# Patient Record
Sex: Female | Born: 1950 | Race: White | Hispanic: No | Marital: Single | Smoking: Former smoker
Health system: Southern US, Community
[De-identification: ages and names within clinical notes are randomized; demographics above are authoritative.]

## PROBLEM LIST (undated history)

## (undated) DIAGNOSIS — Z972 Presence of dental prosthetic device (complete) (partial): Secondary | ICD-10-CM

## (undated) DIAGNOSIS — M8580 Other specified disorders of bone density and structure, unspecified site: Secondary | ICD-10-CM

## (undated) DIAGNOSIS — R519 Headache, unspecified: Secondary | ICD-10-CM

## (undated) DIAGNOSIS — E0590 Thyrotoxicosis, unspecified without thyrotoxic crisis or storm: Secondary | ICD-10-CM

## (undated) DIAGNOSIS — Z8614 Personal history of Methicillin resistant Staphylococcus aureus infection: Secondary | ICD-10-CM

## (undated) DIAGNOSIS — M1990 Unspecified osteoarthritis, unspecified site: Secondary | ICD-10-CM

## (undated) DIAGNOSIS — Q228 Other congenital malformations of tricuspid valve: Secondary | ICD-10-CM

## (undated) DIAGNOSIS — E039 Hypothyroidism, unspecified: Secondary | ICD-10-CM

## (undated) DIAGNOSIS — T7840XA Allergy, unspecified, initial encounter: Secondary | ICD-10-CM

## (undated) DIAGNOSIS — I369 Nonrheumatic tricuspid valve disorder, unspecified: Secondary | ICD-10-CM

## (undated) DIAGNOSIS — C4490 Unspecified malignant neoplasm of skin, unspecified: Secondary | ICD-10-CM

## (undated) DIAGNOSIS — I341 Nonrheumatic mitral (valve) prolapse: Secondary | ICD-10-CM

## (undated) DIAGNOSIS — G912 (Idiopathic) normal pressure hydrocephalus: Secondary | ICD-10-CM

## (undated) DIAGNOSIS — R002 Palpitations: Secondary | ICD-10-CM

## (undated) HISTORY — PX: OTHER SURGICAL HISTORY: SHX169

## (undated) HISTORY — PX: COLONOSCOPY: SHX174

## (undated) HISTORY — PX: ORIF FOOT FRACTURE: SHX2123

## (undated) HISTORY — PX: BREAST BIOPSY: SHX20

---
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
## 2012-11-11 ENCOUNTER — Encounter: Payer: Self-pay | Admitting: Cardiology

## 2014-05-04 DIAGNOSIS — N3946 Mixed incontinence: Secondary | ICD-10-CM | POA: Insufficient documentation

## 2016-04-10 LAB — HM MAMMOGRAPHY

## 2016-12-09 LAB — HM COLONOSCOPY

## 2018-04-20 ENCOUNTER — Encounter: Payer: Self-pay | Admitting: Podiatry

## 2018-04-20 ENCOUNTER — Ambulatory Visit: Payer: Medicare HMO | Admitting: Podiatry

## 2018-04-20 VITALS — BP 110/74 | HR 78

## 2018-04-20 DIAGNOSIS — M205X9 Other deformities of toe(s) (acquired), unspecified foot: Secondary | ICD-10-CM

## 2018-04-20 DIAGNOSIS — M2020 Hallux rigidus, unspecified foot: Secondary | ICD-10-CM

## 2018-04-20 DIAGNOSIS — L600 Ingrowing nail: Secondary | ICD-10-CM

## 2018-04-20 DIAGNOSIS — L608 Other nail disorders: Secondary | ICD-10-CM

## 2018-04-20 NOTE — Progress Notes (Signed)
This patient presents the office with chief complaint of a painful inside border big toe left foot.  She says she has been experiencing pain and redness for approximately 2 months.  She states that she is experiencing throbbing pain when walking.  She has self treated with Epson salts and worked on the nails herself but the problem persists.  She denies any drainage from the inside border left big toe.  She states she also has a similar problem on her right great toe which is not painful today.  She presents the office today for an evaluation and treatment of her left big toe.  Vascular  Dorsalis pedis and posterior tibial pulses are palpable  B/L.  Capillary return  WNL.  Temperature gradient is  WNL.  Skin turgor  WNL  Sensorium  Senn Weinstein monofilament wire  WNL. Normal tactile sensation.  Nail Exam  Patient has normal nails with no evidence of bacterial or fungal infection.  Marked incurvation noted along the medial border of the left great toenail in the absence of any drainage pus or granulation tissue.  Orthopedic  Exam  Muscle tone and muscle strength  WNL.  No limitations of motion feet  B/L.  No crepitus or joint effusion noted.  Functional hallux limitus 1st MPJ  B/L.  Skin  No open lesions.  Normal skin texture and turgor.  Incurvated nail left hallux.  FHL  B/L.  IE.  Debridement of the medial border left great toenail.  Discussed this condition with this patient.  Told her that due to the structure of her nail she is having an incurvation noted at the medial border both great toes.  The redness and pain that she experiences is due to this incurvation and is due to pressure.  Debrided the medial border left great toenail.  Discussed future plans for permanent correction of the medial border left great toenail.  During my examination I noted she had a functional hallux limitus bilaterally.  Patient states that she is very active and that her feet do hurt and she is interested in kinetic  wedge orthoses.  She was told to make an appointment with [facility] for future orthotic casting.   Gardiner Barefoot DPM

## 2018-05-13 ENCOUNTER — Ambulatory Visit (INDEPENDENT_AMBULATORY_CARE_PROVIDER_SITE_OTHER): Payer: Medicare HMO | Admitting: Orthotics

## 2018-05-13 DIAGNOSIS — M205X9 Other deformities of toe(s) (acquired), unspecified foot: Secondary | ICD-10-CM

## 2018-05-13 DIAGNOSIS — M2020 Hallux rigidus, unspecified foot: Secondary | ICD-10-CM | POA: Diagnosis not present

## 2018-05-13 NOTE — Progress Notes (Signed)
Patient seen today per dr. Prudence Davidson.  She presents with hx of pain associated with FHL b/L.   She reports pain in the 1st MPJ. Plan on Richy to fan CMFO w/ b/l k-wedge.  Liliane Channel

## 2018-05-14 ENCOUNTER — Encounter: Payer: Self-pay | Admitting: Podiatry

## 2018-05-14 ENCOUNTER — Ambulatory Visit: Payer: Medicare HMO | Admitting: Podiatry

## 2018-05-14 DIAGNOSIS — L600 Ingrowing nail: Secondary | ICD-10-CM | POA: Diagnosis not present

## 2018-05-14 DIAGNOSIS — L608 Other nail disorders: Secondary | ICD-10-CM

## 2018-05-14 NOTE — Progress Notes (Signed)
This patient presents to the office requesting nail surgery that was discussed last month.  We held off on her surgery since she was on vacation.  She says she has pain and discomfort on the inside border left great toenail. She presents for definitive nail treatment.  Vascular  Dorsalis pedis and posterior tibial pulses are palpable  B/L.  Capillary return  WNL.  Temperature gradient is  WNL.  Skin turgor  WNL  Sensorium  Senn Weinstein monofilament wire  WNL. Normal tactile sensation.  Nail Exam  Patient has normal nails with no evidence of bacterial or fungal infection.  Marked incurvation medial border left hallux in the absence of granulation tissue.  Orthopedic  Exam  Muscle tone and muscle strength  WNL.  No limitations of motion feet  B/L.  No crepitus or joint effusion noted.  Foot type is unremarkable and digits show no abnormalities.  Functional hallux limitus 1st MPJ  B/L.  Skin  No open lesions.  Normal skin texture and turgor.  Incurvated ingrown toenail medial border left hallux.  ROV.  Nail surgery.  Treatment options and alternatives discussed.  Recommended permanent phenol matrixectomy and patient agreed.  Left hallux  was prepped with alcohol and a toe block of 3cc of 2% lidocaine plain was administered in a digital toe block. .  The toe was then prepped with betadine solution . A tourniquet was applied to toe. The offending nail border was then excised and matrix tissue exposed.  Phenol was then applied to the matrix tissue followed by an alcohol wash.  Antibiotic ointment and a dry sterile dressing was applied.  The patient was dispensed instructions for aftercare. RTC 10 days.     Gardiner Barefoot DPM

## 2018-05-25 ENCOUNTER — Ambulatory Visit: Payer: Medicare HMO | Admitting: Podiatry

## 2018-05-25 ENCOUNTER — Encounter: Payer: Self-pay | Admitting: Podiatry

## 2018-05-25 DIAGNOSIS — M205X9 Other deformities of toe(s) (acquired), unspecified foot: Secondary | ICD-10-CM

## 2018-05-25 DIAGNOSIS — Z09 Encounter for follow-up examination after completed treatment for conditions other than malignant neoplasm: Secondary | ICD-10-CM

## 2018-05-25 DIAGNOSIS — M2020 Hallux rigidus, unspecified foot: Secondary | ICD-10-CM

## 2018-05-25 DIAGNOSIS — L608 Other nail disorders: Secondary | ICD-10-CM

## 2018-05-25 MED ORDER — DOXYCYCLINE HYCLATE 100 MG PO TABS: 100.0000 mg | ORAL_TABLET | Freq: Two times a day (BID) | ORAL | 0 refills | Status: DC

## 2018-05-25 NOTE — Progress Notes (Signed)
.    This patient returns to the office follow-up for nail surgery on the inside border of the left big toe.  She states she was doing very well until last Friday when she started 2 weeks experience pain and discomfort at the site of the nail surgery.  She denies any drainage from the site of the surgery.  She states that the surgical site is still very tender and she presents the office under the assumption she has an infection to her left big toe.  Vascular  Dorsalis pedis and posterior tibial pulses are palpable  B/L.  Capillary return  WNL.  Temperature gradient is  WNL.  Skin turgor  WNL  Sensorium  Senn Weinstein monofilament wire  WNL. Normal tactile sensation.  Nail Exam  Patient has normal nails with no evidence of bacterial or fungal infection. No evidence of redness or swelling along the medial border left hallux.  Orthopedic  Exam  Muscle tone and muscle strength  WNL.  No limitations of motion feet  B/L.  No crepitus or joint effusion noted.  Foot type is unremarkable and digits show no abnormalities.  Bony prominences are unremarkable. Functional hallux limitus noted  B/L  Skin  No open lesions.  Normal skin texture and turgor.  S/P Nail surgery left hallux.  Functional hallux limitus 1st MPJ  B/L.  ROV.  Discussed the nail surgery with this patient.  Told her that the surgical site is healing well but she still appears to have pain out of proportion.  Therefore I prescribed her doxycycline No. 15 1 twice daily until completed patient also requests that she received a note for gait therapy.  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  Patient to return to the office as needed   Gardiner Barefoot DPM

## 2018-05-27 ENCOUNTER — Ambulatory Visit (INDEPENDENT_AMBULATORY_CARE_PROVIDER_SITE_OTHER): Payer: Medicare HMO | Admitting: Orthotics

## 2018-05-27 DIAGNOSIS — M2020 Hallux rigidus, unspecified foot: Principal | ICD-10-CM

## 2018-05-27 DIAGNOSIS — M205X9 Other deformities of toe(s) (acquired), unspecified foot: Secondary | ICD-10-CM

## 2018-05-29 NOTE — Progress Notes (Signed)
Patient came in today to p/up functional foot orthotics.   The orthotics were assessed to both fit and function.  The F/O addressed the biomechanical issues/pathologies as intended, offering good longitudinal arch support, proper offloading, and foot support. There weren't any signs of discomfort or irritation.  The F/O fit properly in footwear with minimal trimming/adjustments. 

## 2018-07-21 ENCOUNTER — Encounter: Admission: RE | Payer: Self-pay | Source: Home / Self Care

## 2018-07-21 ENCOUNTER — Ambulatory Visit: Admission: RE | Admit: 2018-07-21 | Payer: Medicare HMO | Source: Home / Self Care | Admitting: Ophthalmology

## 2018-07-21 SURGERY — PHACOEMULSIFICATION, CATARACT, WITH IOL INSERTION
Anesthesia: Choice | Laterality: Right

## 2018-09-15 ENCOUNTER — Other Ambulatory Visit: Payer: Self-pay

## 2018-09-17 NOTE — Discharge Instructions (Signed)

## 2018-09-18 ENCOUNTER — Other Ambulatory Visit
Admission: RE | Admit: 2018-09-18 | Discharge: 2018-09-18 | Disposition: A | Discharge status: Home / Self Care | Payer: Medicare HMO | Source: Ambulatory Visit | Attending: Ophthalmology | Admitting: Ophthalmology

## 2018-09-18 ENCOUNTER — Other Ambulatory Visit: Payer: Self-pay

## 2018-09-18 DIAGNOSIS — Z01812 Encounter for preprocedural laboratory examination: Secondary | ICD-10-CM | POA: Insufficient documentation

## 2018-09-18 DIAGNOSIS — Z1159 Encounter for screening for other viral diseases: Secondary | ICD-10-CM | POA: Diagnosis not present

## 2018-09-19 LAB — NOVEL CORONAVIRUS, NAA (HOSP ORDER, SEND-OUT TO REF LAB; TAT 18-24 HRS): SARS-CoV-2, NAA: NOT DETECTED
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
## 2018-09-22 ENCOUNTER — Ambulatory Visit: Payer: Medicare HMO | Admitting: Anesthesiology

## 2018-09-22 ENCOUNTER — Encounter
Admission: RE | Disposition: A | Discharge status: Home / Self Care | Payer: Self-pay | Source: Home / Self Care | Attending: Ophthalmology

## 2018-09-22 ENCOUNTER — Ambulatory Visit
Admission: RE | Admit: 2018-09-22 | Discharge: 2018-09-22 | Disposition: A | Discharge status: Home / Self Care | Payer: Medicare HMO | Attending: Ophthalmology | Admitting: Ophthalmology

## 2018-09-22 ENCOUNTER — Other Ambulatory Visit: Payer: Self-pay

## 2018-09-22 DIAGNOSIS — H2511 Age-related nuclear cataract, right eye: Secondary | ICD-10-CM | POA: Diagnosis present

## 2018-09-22 DIAGNOSIS — I2510 Atherosclerotic heart disease of native coronary artery without angina pectoris: Secondary | ICD-10-CM | POA: Diagnosis not present

## 2018-09-22 DIAGNOSIS — Z85828 Personal history of other malignant neoplasm of skin: Secondary | ICD-10-CM | POA: Diagnosis not present

## 2018-09-22 DIAGNOSIS — Z79890 Hormone replacement therapy (postmenopausal): Secondary | ICD-10-CM | POA: Insufficient documentation

## 2018-09-22 DIAGNOSIS — F17210 Nicotine dependence, cigarettes, uncomplicated: Secondary | ICD-10-CM | POA: Insufficient documentation

## 2018-09-22 DIAGNOSIS — E079 Disorder of thyroid, unspecified: Secondary | ICD-10-CM | POA: Insufficient documentation

## 2018-09-22 HISTORY — DX: Hypothyroidism, unspecified: E03.9

## 2018-09-22 HISTORY — DX: Other congenital malformations of tricuspid valve: Q22.8

## 2018-09-22 HISTORY — DX: Presence of dental prosthetic device (complete) (partial): Z97.2

## 2018-09-22 HISTORY — DX: Allergy, unspecified, initial encounter: T78.40XA

## 2018-09-22 HISTORY — DX: Other specified disorders of bone density and structure, unspecified site: M85.80

## 2018-09-22 HISTORY — DX: Nonrheumatic mitral (valve) prolapse: I34.1

## 2018-09-22 HISTORY — DX: Unspecified malignant neoplasm of skin, unspecified: C44.90

## 2018-09-22 HISTORY — PX: CATARACT EXTRACTION W/PHACO: SHX586

## 2018-09-22 HISTORY — DX: Palpitations: R00.2

## 2018-09-22 HISTORY — DX: Unspecified osteoarthritis, unspecified site: M19.90

## 2018-09-22 SURGERY — PHACOEMULSIFICATION, CATARACT, WITH IOL INSERTION
Anesthesia: Monitor Anesthesia Care | Site: Eye | Laterality: Right

## 2018-09-22 MED ORDER — EPINEPHRINE PF 1 MG/ML IJ SOLN
INTRAOCULAR | Status: DC | PRN
  Administered 2018-09-22: 11:00:00 78 mL via OPHTHALMIC

## 2018-09-22 MED ORDER — ARMC OPHTHALMIC DILATING DROPS
1.0000 "application " | OPHTHALMIC | Status: DC | PRN
  Administered 2018-09-22 (×3): 1 via OPHTHALMIC

## 2018-09-22 MED ORDER — NA CHONDROIT SULF-NA HYALURON 40-17 MG/ML IO SOLN
INTRAOCULAR | Status: DC | PRN
  Administered 2018-09-22: 1 mL via INTRAOCULAR

## 2018-09-22 MED ORDER — MOXIFLOXACIN HCL 0.5 % OP SOLN
OPHTHALMIC | Status: DC | PRN
  Administered 2018-09-22: 0.2 mL via OPHTHALMIC

## 2018-09-22 MED ORDER — LIDOCAINE HCL (PF) 2 % IJ SOLN
INTRAOCULAR | Status: DC | PRN
  Administered 2018-09-22: 11:00:00 1 mL

## 2018-09-22 MED ORDER — BRIMONIDINE TARTRATE-TIMOLOL 0.2-0.5 % OP SOLN
OPHTHALMIC | Status: DC | PRN
  Administered 2018-09-22: 1 [drp] via OPHTHALMIC

## 2018-09-22 MED ORDER — ONDANSETRON HCL 4 MG/2ML IJ SOLN: 4.0000 mg | Freq: Once | INTRAMUSCULAR | Status: DC | PRN

## 2018-09-22 MED ORDER — FENTANYL CITRATE (PF) 100 MCG/2ML IJ SOLN
INTRAMUSCULAR | Status: DC | PRN
  Administered 2018-09-22: 50 ug via INTRAVENOUS

## 2018-09-22 MED ORDER — MIDAZOLAM HCL 2 MG/2ML IJ SOLN
INTRAMUSCULAR | Status: DC | PRN
  Administered 2018-09-22: 1 mg via INTRAVENOUS

## 2018-09-22 MED ORDER — TETRACAINE HCL 0.5 % OP SOLN
1.0000 [drp] | OPHTHALMIC | Status: DC | PRN
  Administered 2018-09-22 (×3): 1 [drp] via OPHTHALMIC

## 2018-09-22 SURGICAL SUPPLY — 21 items
ACRYSOF IQ PANOPTIX UV IOL  SIZE D (Intraocular Lens) ×1 IMPLANT
CANNULA ANT/CHMB 27G (MISCELLANEOUS) ×1 IMPLANT
CANNULA ANT/CHMB 27GA (MISCELLANEOUS) ×2 IMPLANT
GLOVE SURG LX 8.0 MICRO (GLOVE) ×2
GLOVE SURG LX STRL 8.0 MICRO (GLOVE) ×1 IMPLANT
GLOVE SURG TRIUMPH 8.0 PF LTX (GLOVE) ×2 IMPLANT
GOWN STRL REUS W/ TWL LRG LVL3 (GOWN DISPOSABLE) ×2 IMPLANT
GOWN STRL REUS W/TWL LRG LVL3 (GOWN DISPOSABLE) ×2
MARKER SKIN DUAL TIP RULER LAB (MISCELLANEOUS) ×2 IMPLANT
NDL FILTER BLUNT 18X1 1/2 (NEEDLE) ×1 IMPLANT
NDL RETROBULBAR .5 NSTRL (NEEDLE) ×2 IMPLANT
NEEDLE FILTER BLUNT 18X 1/2SAF (NEEDLE) ×1
NEEDLE FILTER BLUNT 18X1 1/2 (NEEDLE) ×1 IMPLANT
PACK EYE AFTER SURG (MISCELLANEOUS) ×2 IMPLANT
PACK OPTHALMIC (MISCELLANEOUS) ×2 IMPLANT
PACK PORFILIO (MISCELLANEOUS) ×2 IMPLANT
SUT ETHILON 10-0 CS-B-6CS-B-6 (SUTURE)
SUTURE EHLN 10-0 CS-B-6CS-B-6 (SUTURE) IMPLANT
SYR 3ML LL SCALE MARK (SYRINGE) ×2 IMPLANT
SYR TB 1ML LUER SLIP (SYRINGE) ×2 IMPLANT
WIPE NON LINTING 3.25X3.25 (MISCELLANEOUS) ×2 IMPLANT

## 2018-09-22 NOTE — H&P (Signed)
All labs reviewed. Abnormal studies sent to patients PCP when indicated.  Previous H&P reviewed, patient examined, there are NO CHANGES.  Martha Hickox Porfilio6/16/202010:17 AM

## 2018-09-22 NOTE — Anesthesia Procedure Notes (Signed)
Procedure Name: MAC Date/Time: 09/22/2018 10:28 AM Performed by: Cameron Ali, CRNA Pre-anesthesia Checklist: Patient identified, Emergency Drugs available, Suction available, Timeout performed and Patient being monitored Patient Re-evaluated:Patient Re-evaluated prior to induction Oxygen Delivery Method: Nasal cannula Placement Confirmation: positive ETCO2

## 2018-09-22 NOTE — Op Note (Addendum)
PREOPERATIVE DIAGNOSIS:  Nuclear sclerotic cataract of the right eye.   POSTOPERATIVE DIAGNOSIS:  CATARACT   OPERATIVE PROCEDURE: Procedure(s): CATARACT EXTRACTION PHACO AND INTRAOCULAR LENS PLACEMENT ([place])  RIGHT panooptix   SURGEON:  Birder Robson, MD.   ANESTHESIA:  Anesthesiologist: Darrin Nipper, MD CRNA: Cameron Ali, CRNA  1.      Managed anesthesia care. 2.      0.56ml of Shugarcaine was instilled in the eye following the paracentesis.   COMPLICATIONS:  None.   TECHNIQUE:   Stop and chop   DESCRIPTION OF PROCEDURE:  The patient was examined and consented in the preoperative holding area where the aforementioned topical anesthesia was applied to the right eye and then brought back to the Operating Room where the right eye was prepped and draped in the usual sterile ophthalmic fashion and a lid speculum was placed. A paracentesis was created with the side port blade and the anterior chamber was filled with viscoelastic. A near clear corneal incision was performed with the steel keratome. A continuous curvilinear capsulorrhexis was performed with a cystotome followed by the capsulorrhexis forceps. Hydrodissection and hydrodelineation were carried out with BSS on a blunt cannula. The lens was removed in a stop and chop  technique and the remaining cortical material was removed with the irrigation-aspiration handpiece. The capsular bag was inflated with viscoelastic and the Alcon TFAT 22.5  lens was placed in the capsular bag without complication. The remaining viscoelastic was removed from the eye with the irrigation-aspiration handpiece. The wounds were hydrated. The anterior chamber was flushed with BSS and the eye was inflated to physiologic pressure. 0.75ml of Vigamox was placed in the anterior chamber. The wounds were found to be water tight. The eye was dressed with [place]. The patient was given protective glasses to wear throughout the day and a shield with which to sleep  tonight. The patient was also given drops with which to begin a drop regimen today and will follow-up with me in one day. Implant Name Type Inv. Item Serial No. Manufacturer Lot No. LRB No. Used Action  ACRYSOF IQ PANOPTIX UV IOL  SIZE D Intraocular Lens  [id number] ALCON  Right 1 Implanted   Procedure(s): CATARACT EXTRACTION PHACO AND INTRAOCULAR LENS PLACEMENT ([facility])  RIGHT panooptix (Right)  Electronically signed: Birder Robson 09/22/2018 10:52 AM

## 2018-09-22 NOTE — Anesthesia Postprocedure Evaluation (Signed)
Anesthesia Post Note  Patient: Martha Wilson  Procedure(s) Performed: CATARACT EXTRACTION PHACO AND INTRAOCULAR LENS PLACEMENT ([facility])  RIGHT panooptix (Right Eye)  Patient location during evaluation: PACU Anesthesia Type: MAC Level of consciousness: awake and alert, oriented and patient cooperative Pain management: pain level controlled Vital Signs Assessment: post-procedure vital signs reviewed and stable Respiratory status: spontaneous breathing, nonlabored ventilation and respiratory function stable Cardiovascular status: blood pressure returned to baseline and stable Postop Assessment: adequate PO intake Anesthetic complications: no    Darrin Nipper

## 2018-09-22 NOTE — Transfer of Care (Signed)
Immediate Anesthesia Transfer of Care Note  Patient: Martha Wilson  Procedure(s) Performed: CATARACT EXTRACTION PHACO AND INTRAOCULAR LENS PLACEMENT ([facility])  RIGHT panooptix (Right Eye)  Patient Location: PACU  Anesthesia Type: MAC  Level of Consciousness: awake, alert  and patient cooperative  Airway and Oxygen Therapy: Patient Spontanous Breathing and Patient connected to supplemental oxygen  Post-op Assessment: Post-op Vital signs reviewed, Patient's Cardiovascular Status Stable, Respiratory Function Stable, Patent Airway and No signs of Nausea or vomiting  Post-op Vital Signs: Reviewed and stable  Complications: No apparent anesthesia complications

## 2018-09-23 ENCOUNTER — Encounter: Payer: Self-pay | Admitting: Ophthalmology

## 2018-09-25 ENCOUNTER — Telehealth: Payer: Self-pay

## 2018-09-25 NOTE — Telephone Encounter (Signed)
Pt would like to schedule a new pt appt with Dr. Derrel Nip. She stated that Dr. Derrel Nip accepted her sometime back just hasn't scheduled the appt yet.

## 2018-09-28 NOTE — Telephone Encounter (Signed)
I do not see in Pt's chart or have a message from Dr. Derrel Nip stating she would except her.

## 2018-09-29 NOTE — Telephone Encounter (Signed)
Dr. Derrel Nip did accept as a new pt.

## 2018-09-29 NOTE — Telephone Encounter (Signed)
Do you recall accepting as a new pt?

## 2018-09-29 NOTE — Telephone Encounter (Signed)
Yes, she is [employer]

## 2018-09-30 ENCOUNTER — Other Ambulatory Visit: Payer: Self-pay

## 2018-09-30 ENCOUNTER — Encounter: Payer: Self-pay | Admitting: *Deleted

## 2018-09-30 NOTE — Telephone Encounter (Signed)
Lm on vm to call office and set up new patient appt with Dr. Derrel Nip.

## 2018-10-02 ENCOUNTER — Other Ambulatory Visit
Admission: RE | Admit: 2018-10-02 | Discharge: 2018-10-02 | Disposition: A | Discharge status: Home / Self Care | Payer: Medicare HMO | Source: Ambulatory Visit | Attending: Ophthalmology | Admitting: Ophthalmology

## 2018-10-02 ENCOUNTER — Other Ambulatory Visit: Payer: Self-pay

## 2018-10-02 DIAGNOSIS — Z1159 Encounter for screening for other viral diseases: Secondary | ICD-10-CM | POA: Insufficient documentation

## 2018-10-02 NOTE — Discharge Instructions (Signed)

## 2018-10-03 LAB — NOVEL CORONAVIRUS, NAA (HOSP ORDER, SEND-OUT TO REF LAB; TAT 18-24 HRS): SARS-CoV-2, NAA: NOT DETECTED

## 2018-10-06 ENCOUNTER — Ambulatory Visit
Admission: RE | Admit: 2018-10-06 | Discharge: 2018-10-06 | Disposition: A | Discharge status: Home / Self Care | Payer: Medicare HMO | Attending: Ophthalmology | Admitting: Ophthalmology

## 2018-10-06 ENCOUNTER — Ambulatory Visit: Payer: Medicare HMO | Admitting: Anesthesiology

## 2018-10-06 ENCOUNTER — Other Ambulatory Visit: Payer: Self-pay

## 2018-10-06 ENCOUNTER — Encounter
Admission: RE | Disposition: A | Discharge status: Home / Self Care | Payer: Self-pay | Source: Home / Self Care | Attending: Ophthalmology

## 2018-10-06 DIAGNOSIS — Z8614 Personal history of Methicillin resistant Staphylococcus aureus infection: Secondary | ICD-10-CM | POA: Diagnosis not present

## 2018-10-06 DIAGNOSIS — I2510 Atherosclerotic heart disease of native coronary artery without angina pectoris: Secondary | ICD-10-CM | POA: Insufficient documentation

## 2018-10-06 DIAGNOSIS — M1990 Unspecified osteoarthritis, unspecified site: Secondary | ICD-10-CM | POA: Insufficient documentation

## 2018-10-06 DIAGNOSIS — Z85828 Personal history of other malignant neoplasm of skin: Secondary | ICD-10-CM | POA: Diagnosis not present

## 2018-10-06 DIAGNOSIS — Z9849 Cataract extraction status, unspecified eye: Secondary | ICD-10-CM | POA: Diagnosis not present

## 2018-10-06 DIAGNOSIS — M8580 Other specified disorders of bone density and structure, unspecified site: Secondary | ICD-10-CM | POA: Diagnosis not present

## 2018-10-06 DIAGNOSIS — E0590 Thyrotoxicosis, unspecified without thyrotoxic crisis or storm: Secondary | ICD-10-CM | POA: Insufficient documentation

## 2018-10-06 DIAGNOSIS — H2512 Age-related nuclear cataract, left eye: Secondary | ICD-10-CM | POA: Diagnosis present

## 2018-10-06 DIAGNOSIS — I081 Rheumatic disorders of both mitral and tricuspid valves: Secondary | ICD-10-CM | POA: Diagnosis not present

## 2018-10-06 DIAGNOSIS — Z79899 Other long term (current) drug therapy: Secondary | ICD-10-CM | POA: Insufficient documentation

## 2018-10-06 DIAGNOSIS — F17200 Nicotine dependence, unspecified, uncomplicated: Secondary | ICD-10-CM | POA: Insufficient documentation

## 2018-10-06 DIAGNOSIS — Z880 Allergy status to penicillin: Secondary | ICD-10-CM | POA: Diagnosis not present

## 2018-10-06 HISTORY — PX: CATARACT EXTRACTION W/PHACO: SHX586

## 2018-10-06 SURGERY — PHACOEMULSIFICATION, CATARACT, WITH IOL INSERTION
Anesthesia: Monitor Anesthesia Care | Site: Eye | Laterality: Left

## 2018-10-06 MED ORDER — ONDANSETRON HCL 4 MG/2ML IJ SOLN: 4.0000 mg | Freq: Once | INTRAMUSCULAR | Status: DC | PRN

## 2018-10-06 MED ORDER — NA CHONDROIT SULF-NA HYALURON 40-17 MG/ML IO SOLN
INTRAOCULAR | Status: DC | PRN
  Administered 2018-10-06: 1 mL via INTRAOCULAR

## 2018-10-06 MED ORDER — EPINEPHRINE PF 1 MG/ML IJ SOLN
INTRAOCULAR | Status: DC | PRN
  Administered 2018-10-06: 13:00:00 74 mL via OPHTHALMIC

## 2018-10-06 MED ORDER — TETRACAINE HCL 0.5 % OP SOLN
1.0000 [drp] | OPHTHALMIC | Status: DC | PRN
  Administered 2018-10-06 (×3): 1 [drp] via OPHTHALMIC

## 2018-10-06 MED ORDER — BRIMONIDINE TARTRATE-TIMOLOL 0.2-0.5 % OP SOLN
OPHTHALMIC | Status: DC | PRN
  Administered 2018-10-06: 1 [drp] via OPHTHALMIC

## 2018-10-06 MED ORDER — LACTATED RINGERS IV SOLN: INTRAVENOUS | Status: DC

## 2018-10-06 MED ORDER — MOXIFLOXACIN HCL 0.5 % OP SOLN
OPHTHALMIC | Status: DC | PRN
  Administered 2018-10-06: 0.2 mL via OPHTHALMIC

## 2018-10-06 MED ORDER — FENTANYL CITRATE (PF) 100 MCG/2ML IJ SOLN
INTRAMUSCULAR | Status: DC | PRN
  Administered 2018-10-06: 50 ug via INTRAVENOUS

## 2018-10-06 MED ORDER — LIDOCAINE HCL (PF) 2 % IJ SOLN
INTRAOCULAR | Status: DC | PRN
  Administered 2018-10-06: 13:00:00 1 mL

## 2018-10-06 MED ORDER — MIDAZOLAM HCL 2 MG/2ML IJ SOLN
INTRAMUSCULAR | Status: DC | PRN
  Administered 2018-10-06: 1 mg via INTRAVENOUS

## 2018-10-06 MED ORDER — ARMC OPHTHALMIC DILATING DROPS
1.0000 "application " | OPHTHALMIC | Status: DC | PRN
  Administered 2018-10-06 (×3): 1 via OPHTHALMIC

## 2018-10-06 SURGICAL SUPPLY — 21 items
ACRYSOF IQ PANOPTIX UV IOL (Intraocular Lens) ×1 IMPLANT
CANNULA ANT/CHMB 27G (MISCELLANEOUS) ×1 IMPLANT
CANNULA ANT/CHMB 27GA (MISCELLANEOUS) ×2 IMPLANT
GLOVE SURG LX 8.0 MICRO (GLOVE) ×2
GLOVE SURG LX STRL 8.0 MICRO (GLOVE) ×1 IMPLANT
GLOVE SURG TRIUMPH 8.0 PF LTX (GLOVE) ×2 IMPLANT
GOWN STRL REUS W/ TWL LRG LVL3 (GOWN DISPOSABLE) ×2 IMPLANT
GOWN STRL REUS W/TWL LRG LVL3 (GOWN DISPOSABLE) ×2
MARKER SKIN DUAL TIP RULER LAB (MISCELLANEOUS) ×2 IMPLANT
NDL FILTER BLUNT 18X1 1/2 (NEEDLE) ×1 IMPLANT
NDL RETROBULBAR .5 NSTRL (NEEDLE) ×2 IMPLANT
NEEDLE FILTER BLUNT 18X 1/2SAF (NEEDLE) ×1
NEEDLE FILTER BLUNT 18X1 1/2 (NEEDLE) ×1 IMPLANT
PACK EYE AFTER SURG (MISCELLANEOUS) ×2 IMPLANT
PACK OPTHALMIC (MISCELLANEOUS) ×2 IMPLANT
PACK PORFILIO (MISCELLANEOUS) ×2 IMPLANT
SUT ETHILON 10-0 CS-B-6CS-B-6 (SUTURE)
SUTURE EHLN 10-0 CS-B-6CS-B-6 (SUTURE) IMPLANT
SYR 3ML LL SCALE MARK (SYRINGE) ×2 IMPLANT
SYR TB 1ML LUER SLIP (SYRINGE) ×2 IMPLANT
WIPE NON LINTING 3.25X3.25 (MISCELLANEOUS) ×2 IMPLANT

## 2018-10-06 NOTE — Transfer of Care (Signed)
Immediate Anesthesia Transfer of Care Note  Patient: Martha Wilson  Procedure(s) Performed: CATARACT EXTRACTION PHACO AND INTRAOCULAR LENS PLACEMENT ([facility]) LEFT PANOPTIX LENS (Left Eye)  Patient Location: PACU  Anesthesia Type: MAC  Level of Consciousness: awake, alert  and patient cooperative  Airway and Oxygen Therapy: Patient Spontanous Breathing and Patient connected to supplemental oxygen  Post-op Assessment: Post-op Vital signs reviewed, Patient's Cardiovascular Status Stable, Respiratory Function Stable, Patent Airway and No signs of Nausea or vomiting  Post-op Vital Signs: Reviewed and stable  Complications: No apparent anesthesia complications

## 2018-10-06 NOTE — Op Note (Signed)
PREOPERATIVE DIAGNOSIS:  Nuclear sclerotic cataract of the left eye.   POSTOPERATIVE DIAGNOSIS:  Nuclear sclerotic cataract of the left eye.   OPERATIVE PROCEDURE: Procedure(s): CATARACT EXTRACTION PHACO AND INTRAOCULAR LENS PLACEMENT ([place]) LEFT PANOPTIX LENS   SURGEON:  Birder Robson, MD.   ANESTHESIA:  Anesthesiologist: Veda Canning, MD CRNA: Cameron Ali, CRNA  1.      Managed anesthesia care. 2.     0.76ml of Shugarcaine was instilled following the paracentesis   COMPLICATIONS:  None.   TECHNIQUE:   Stop and chop   DESCRIPTION OF PROCEDURE:  The patient was examined and consented in the preoperative holding area where the aforementioned topical anesthesia was applied to the left eye and then brought back to the Operating Room where the left eye was prepped and draped in the usual sterile ophthalmic fashion and a lid speculum was placed. A paracentesis was created with the side port blade and the anterior chamber was filled with viscoelastic. A near clear corneal incision was performed with the steel keratome. A continuous curvilinear capsulorrhexis was performed with a cystotome followed by the capsulorrhexis forceps. Hydrodissection and hydrodelineation were carried out with BSS on a blunt cannula. The lens was removed in a stop and chop  technique and the remaining cortical material was removed with the irrigation-aspiration handpiece. The capsular bag was inflated with viscoelastic and the Alcon TFat00 lens was placed in the capsular bag without complication. The remaining viscoelastic was removed from the eye with the irrigation-aspiration handpiece. The wounds were hydrated. The anterior chamber was flushed with BSS and the eye was inflated to physiologic pressure. 0.29ml Vigamox was placed in the anterior chamber. The wounds were found to be water tight. The eye was dressed with  [place]. The patient was given protective glasses to wear throughout the day and a shield with which to  sleep tonight. The patient was also given drops with which to begin a drop regimen today and will follow-up with me in one day. Implant Name Type Inv. Item Serial No. Manufacturer Lot No. LRB No. Used Action  ACRYSOF IQ PANOPTIX UV IOL Intraocular Lens  [id number] ALCON  Left 1 Implanted    Procedure(s): CATARACT EXTRACTION PHACO AND INTRAOCULAR LENS PLACEMENT ([facility]) LEFT PANOPTIX LENS (Left)  Electronically signed: Birder Robson 10/06/2018 1:37 PM

## 2018-10-06 NOTE — Anesthesia Procedure Notes (Signed)
Procedure Name: MAC Date/Time: 10/06/2018 1:16 PM Performed by: Cameron Ali, CRNA Pre-anesthesia Checklist: Patient identified, Emergency Drugs available, Suction available, Timeout performed and Patient being monitored Patient Re-evaluated:Patient Re-evaluated prior to induction Oxygen Delivery Method: Nasal cannula Placement Confirmation: positive ETCO2

## 2018-10-06 NOTE — H&P (Signed)
All labs reviewed. Abnormal studies sent to patients PCP when indicated.  Previous H&P reviewed, patient examined, there are NO CHANGES.  Martha Fischel Porfilio6/30/20201:04 PM

## 2018-10-06 NOTE — Anesthesia Preprocedure Evaluation (Signed)
Anesthesia Evaluation  Patient identified by MRN, date of birth, ID band Patient awake    Reviewed: Allergy & Precautions, NPO status , Patient's Chart, lab work & pertinent test results  History of Anesthesia Complications Negative for: history of anesthetic complications  Airway Mallampati: I   Neck ROM: Full    Dental  (+) Upper Dentures, Lower Dentures   Pulmonary Current Smoker,    breath sounds clear to auscultation       Cardiovascular + CAD (mild nonobstructive)  + Valvular Problems/Murmurs (tricuspid valve disorder) MVP  Rhythm:Regular Rate:Normal     Neuro/Psych negative neurological ROS     GI/Hepatic negative GI ROS,   Endo/Other  Hyperthyroidism   Renal/GU      Musculoskeletal  (+) Arthritis ,   Abdominal   Peds  Hematology   Anesthesia Other Findings   Reproductive/Obstetrics                             Anesthesia Physical  Anesthesia Plan  ASA: III  Anesthesia Plan: MAC   Post-op Pain Management:    Induction: Intravenous  PONV Risk Score and Plan: 1  Airway Management Planned: Nasal Cannula  Additional Equipment:   Intra-op Plan:   Post-operative Plan:   Informed Consent: I have reviewed the patients History and Physical, chart, labs and discussed the procedure including the risks, benefits and alternatives for the proposed anesthesia with the patient or authorized representative who has indicated his/her understanding and acceptance.       Plan Discussed with: CRNA  Anesthesia Plan Comments:         Anesthesia Quick Evaluation

## 2018-10-06 NOTE — Anesthesia Postprocedure Evaluation (Signed)
Anesthesia Post Note  Patient: Martha Wilson  Procedure(s) Performed: CATARACT EXTRACTION PHACO AND INTRAOCULAR LENS PLACEMENT ([facility]) LEFT PANOPTIX LENS (Left Eye)  Patient location during evaluation: PACU Anesthesia Type: MAC Level of consciousness: awake and alert Pain management: pain level controlled Vital Signs Assessment: post-procedure vital signs reviewed and stable Respiratory status: spontaneous breathing, nonlabored ventilation, respiratory function stable and patient connected to nasal cannula oxygen Cardiovascular status: stable and blood pressure returned to baseline Postop Assessment: no apparent nausea or vomiting Anesthetic complications: no    Veda Canning

## 2018-10-28 ENCOUNTER — Other Ambulatory Visit: Payer: Self-pay

## 2018-10-28 ENCOUNTER — Ambulatory Visit (INDEPENDENT_AMBULATORY_CARE_PROVIDER_SITE_OTHER): Payer: Medicare HMO | Admitting: Internal Medicine

## 2018-10-28 ENCOUNTER — Encounter: Payer: Self-pay | Admitting: Internal Medicine

## 2018-10-28 DIAGNOSIS — E042 Nontoxic multinodular goiter: Secondary | ICD-10-CM | POA: Diagnosis not present

## 2018-10-28 DIAGNOSIS — I361 Nonrheumatic tricuspid (valve) insufficiency: Secondary | ICD-10-CM | POA: Diagnosis not present

## 2018-10-28 DIAGNOSIS — Z720 Tobacco use: Secondary | ICD-10-CM

## 2018-10-28 DIAGNOSIS — Z86010 Personal history of colonic polyps: Secondary | ICD-10-CM

## 2018-10-28 DIAGNOSIS — I2510 Atherosclerotic heart disease of native coronary artery without angina pectoris: Secondary | ICD-10-CM | POA: Diagnosis not present

## 2018-10-28 DIAGNOSIS — E0590 Thyrotoxicosis, unspecified without thyrotoxic crisis or storm: Secondary | ICD-10-CM

## 2018-10-28 DIAGNOSIS — I2583 Coronary atherosclerosis due to lipid rich plaque: Secondary | ICD-10-CM
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
## 2018-10-30 ENCOUNTER — Encounter: Payer: Self-pay | Admitting: Internal Medicine

## 2018-10-30 DIAGNOSIS — I2510 Atherosclerotic heart disease of native coronary artery without angina pectoris: Secondary | ICD-10-CM | POA: Insufficient documentation

## 2018-10-30 DIAGNOSIS — I361 Nonrheumatic tricuspid (valve) insufficiency: Secondary | ICD-10-CM | POA: Insufficient documentation

## 2018-10-30 DIAGNOSIS — E0590 Thyrotoxicosis, unspecified without thyrotoxic crisis or storm: Secondary | ICD-10-CM | POA: Insufficient documentation

## 2018-10-30 DIAGNOSIS — Z720 Tobacco use: Secondary | ICD-10-CM | POA: Insufficient documentation

## 2018-10-30 DIAGNOSIS — E042 Nontoxic multinodular goiter: Secondary | ICD-10-CM | POA: Insufficient documentation

## 2018-10-30 DIAGNOSIS — Z87891 Personal history of nicotine dependence: Secondary | ICD-10-CM | POA: Insufficient documentation

## 2018-10-30 NOTE — Assessment & Plan Note (Signed)
She is a current some day smoker.

## 2018-10-30 NOTE — Assessment & Plan Note (Signed)
With goiter.  Prior biopsies reportedly done and benign.  She will need Endocrinology referral for surveillance.
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
## 2018-10-30 NOTE — Assessment & Plan Note (Addendum)
From her last cardiology evaluation in [place]:   Mild nonobstructive coronary disease documented by invasive coronary angiography November 11, 2012 with mild 20% proximal LAD lesion and mild diffuse disease in the lft main and right coronary artery with preserved left ventricular function. Consideration of both a low-dose aspirin and moderate intensity statin may be considered depending on her lipid profile. She remains active denies anginal symptoms.

## 2018-10-30 NOTE — Assessment & Plan Note (Signed)
Chronic,  Managed with methimazole.  Records needed.  Endocrinology referral in progress

## 2018-11-02 ENCOUNTER — Telehealth: Payer: Self-pay

## 2018-11-02 ENCOUNTER — Other Ambulatory Visit: Payer: Self-pay

## 2018-11-02 DIAGNOSIS — Z86010 Personal history of colonic polyps: Secondary | ICD-10-CM

## 2018-11-02 NOTE — Telephone Encounter (Signed)
Gastroenterology Pre-Procedure Review  Request Date: 11/18/18 Requesting Physician: Dr. Vicente Males  PATIENT REVIEW QUESTIONS: The patient responded to the following health history questions as indicated:    1. Are you having any GI issues? yes (blood after a bowel movement Sunday occured just this one time) 2. Do you have a personal history of Polyps? yes 3. Do you have a family history of Colon Cancer or Polyps? no 4. Diabetes Mellitus? no 5. Joint replacements in the past 12 months?no 6. Major health problems in the past 3 months?no 7. Any artificial heart valves, MVP, or defibrillator?no    MEDICATIONS & ALLERGIES:    Patient reports the following regarding taking any anticoagulation/antiplatelet therapy:   Plavix, Coumadin, Eliquis, Xarelto, Lovenox, Pradaxa, Brilinta, or Effient? no Aspirin? no  Patient confirms/reports the following medications:  Current Outpatient Medications  Medication Sig Dispense Refill  . methimazole (TAPAZOLE) 5 MG tablet Take 5 mg by mouth daily.    . Multiple Vitamins-Minerals (PRESERVISION AREDS) CAPS Take by mouth daily.    . timolol (TIMOPTIC) 0.25 % ophthalmic solution Place 1 drop into both eyes daily.     No current facility-administered medications for this visit.     Patient confirms/reports the following allergies:  Allergies  Allergen Reactions  . Bee Venom Anaphylaxis  . Penicillins     paralysis  . Adhesive [Tape] Rash    Band-aids    No orders of the defined types were placed in this encounter.   AUTHORIZATION INFORMATION Primary Insurance: 1D#: Group #:  Secondary Insurance: 1D#: Group #:  SCHEDULE INFORMATION: Date: 11/18/18 Time: Location:[facility]

## 2018-11-09 ENCOUNTER — Telehealth: Payer: Self-pay | Admitting: Internal Medicine

## 2018-11-09 ENCOUNTER — Other Ambulatory Visit: Payer: Self-pay

## 2018-11-09 MED ORDER — NA SULFATE-K SULFATE-MG SULF 17.5-3.13-1.6 GM/177ML PO SOLN: 1.0000 | Freq: Once | ORAL | 0 refills | Status: AC

## 2018-11-11 ENCOUNTER — Other Ambulatory Visit: Payer: Self-pay | Admitting: Internal Medicine

## 2018-11-11 DIAGNOSIS — Z1231 Encounter for screening mammogram for malignant neoplasm of breast: Secondary | ICD-10-CM

## 2018-11-13 ENCOUNTER — Other Ambulatory Visit: Payer: Self-pay

## 2018-11-13 ENCOUNTER — Other Ambulatory Visit
Admission: RE | Admit: 2018-11-13 | Discharge: 2018-11-13 | Disposition: A | Discharge status: Home / Self Care | Payer: Medicare HMO | Source: Ambulatory Visit | Attending: Gastroenterology | Admitting: Gastroenterology

## 2018-11-13 DIAGNOSIS — Z20828 Contact with and (suspected) exposure to other viral communicable diseases: Secondary | ICD-10-CM | POA: Diagnosis not present

## 2018-11-13 DIAGNOSIS — Z01812 Encounter for preprocedural laboratory examination: Secondary | ICD-10-CM | POA: Insufficient documentation

## 2018-11-14 LAB — SARS CORONAVIRUS 2 (TAT 6-24 HRS): SARS Coronavirus 2: NEGATIVE

## 2018-11-16 ENCOUNTER — Other Ambulatory Visit: Payer: Self-pay

## 2018-11-16 ENCOUNTER — Ambulatory Visit (INDEPENDENT_AMBULATORY_CARE_PROVIDER_SITE_OTHER): Payer: Medicare HMO

## 2018-11-16 DIAGNOSIS — Z0000 Encounter for general adult medical examination without abnormal findings: Secondary | ICD-10-CM | POA: Diagnosis not present

## 2018-11-16 NOTE — Patient Instructions (Addendum)
  Martha Wilson , Thank you for taking time to come for your Medicare Wellness Visit. I appreciate your ongoing commitment to your health goals. Please review the following plan we discussed and let me know if I can assist you in the future.   These are the goals we discussed: Goals      Patient Stated   . DIET - EAT MORE FRUITS AND VEGETABLES (pt-stated)       This is a list of the screening recommended for you and due dates:  Health Maintenance  Topic Date Due  .  Hepatitis C: One time screening is recommended by Center for Disease Control  (CDC) for  adults born from [birth year] through 1967.   [birth year]  . Tetanus Vaccine  01/09/1968  . DEXA scan (bone density measurement)  01/08/2014  . Pneumonia vaccines (1 of 2 - PCV13) 01/08/2014  . Mammogram  04/10/2017  . Flu Shot  11/07/2018  . Colon Cancer Screening  12/26/2021

## 2018-11-16 NOTE — Progress Notes (Addendum)
Subjective:   Martha Wilson is a 68 y.o. female who presents for an Initial Medicare Annual Wellness Visit.  Review of Systems    No ROS.  Medicare Wellness Virtual Visit.  Visual/audio telehealth visit, UTA vital signs.   See social history for additional risk factors.  Cardiac Risk Factors include: advanced age (>88mn, >>1women)     Objective:    Today's Vitals   There is no height or weight on file to calculate BMI.  Advanced Directives 11/16/2018 10/06/2018 09/22/2018  Does Patient Have a Medical Advance Directive? No Yes Yes  Type of Advance Directive - HV[place]Living will [place] will  Does patient want to make changes to medical advance directive? - No - Patient declined No - Patient declined  Copy of [place] Chart? - Yes - validated most recent copy scanned in chart (See row information) No - copy requested  Would patient like information on creating a medical advance directive? No - Patient declined - -    Current Medications (verified) Outpatient Encounter Medications as of 11/16/2018  Medication Sig  . methimazole (TAPAZOLE) 5 MG tablet Take 5 mg by mouth daily.  . Multiple Vitamins-Minerals (PRESERVISION AREDS) CAPS Take by mouth daily.  . timolol (TIMOPTIC) 0.25 % ophthalmic solution Place 1 drop into both eyes daily.   No facility-administered encounter medications on file as of 11/16/2018.     Allergies (verified) Bee venom, Penicillins, and Adhesive [tape]   History: Past Medical History:  Diagnosis Date  . Allergies   . Arthritis    right knee  . Hypothyroidism   . MVP (mitral valve prolapse)   . Osteopenia   . Palpitations    "stress related"  . Skin cancer   . Tricuspid valve prolapse   . Wears dentures    partial upper and lower   Past Surgical History:  Procedure Laterality Date  . breat biopsy    . CATARACT EXTRACTION W/PHACO Right 09/22/2018   Procedure:  CATARACT EXTRACTION PHAC[place]OC[place] panooptix;  Surgeon: PBirder Robson MD;  [place]  Service: Ophthalmology;  Laterality: Right;  . CATARACT EXTRACTION W/PHACO Left 10/06/2018   Procedure: CATARACT EXTRACTION PH[place] AND [place]TRAOCULAR LENS PLACEMENT [place] PANOPTIX LENS;  Surgeon: PBirder Robson MD;  [place]  Service: Ophthalmology;  Laterality: Left;  . COLONOSCOPY    . ORIF FOOT FRACTURE     Family History  Problem Relation Age of Onset  . Heart attack Father   . Thyroid disease Brother   . Early death Brother    Social History   Socioeconomic History  . Marital status: Single    Spouse name: Not on file  . Number of children: Not on file  . Years of education: Not on file  . Highest education level: Not on file  Occupational History  . Not on file  Social Needs  . Financial resource strain: Not hard at all  . Food insecurity    Worry: Never true    Inability: Never true  . Transportation needs    Medical: No    Non-medical: No  Tobacco Use  . Smoking status: Current Some Day Smoker  . Smokeless tobacco: Never Used  . Tobacco comment: "occasional" cigarette  Substance and Sexual Activity  . Alcohol use: Yes    Alcohol/week: 3.0 standard drinks    Types: 3 Glasses of wine per week  Comment:    . Drug use: Never  . Sexual activity: Not Currently  Lifestyle  . Physical activity    Days per week: 7 days    Minutes per session: 20 min  . Stress: Not at all  Relationships  . Social Herbalist on phone: Not on file    Gets together: Not on file    Attends religious service: Not on file    Active member of club or organization: Not on file    Attends meetings of clubs or organizations: Not on file    Relationship status: Not on file  Other Topics Concern  . Not on file  Social History Narrative  . Not on file    Tobacco Counseling Ready to quit: Not Answered  Counseling given: Not Answered Comment: "occasional" cigarette   Clinical Intake:  Pre-visit preparation completed: Yes        Diabetes: No  How often do you need to have someone help you when you read instructions, pamphlets, or other written materials from your doctor or pharmacy?: 1 - Never  Interpreter Needed?: No      Activities of Daily Living In your present state of health, do you have any difficulty performing the following activities: 11/16/2018 10/06/2018  Hearing? N N  Vision? N N  Difficulty concentrating or making decisions? N N  Walking or climbing stairs? N N  Dressing or bathing? N N  Doing errands, shopping? N -  Preparing Food and eating ? N -  Using the Toilet? N -  In the past six months, have you accidently leaked urine? N -  Do you have problems with loss of bowel control? N -  Managing your Medications? N -  Managing your Finances? N -  Housekeeping or managing your Housekeeping? N -     Immunizations and Health Maintenance  There is no immunization history on file for this patient. Health Maintenance Due  Topic Date Due  . Hepatitis C Screening  [birth year]  . TETANUS/TDAP  01/09/1968  . DEXA SCAN  01/08/2014  . PNA vac Low Risk Adult (1 of 2 - PCV13) 01/08/2014  . MAMMOGRAM  04/10/2017  . INFLUENZA VACCINE  11/07/2018    Patient Care Team: [name], MD as PCP - General (Internal Medicine)  Indicate any recent Medical Services you may have received from other than [facility] providers in the past year (date may be approximate).     Assessment:   This is a routine wellness examination for Martha Wilson.  I connected with patient 11/16/18 at 10:00 AM EDT by a video/audio enabled telemedicine application and verified that I am speaking with the correct person using two identifiers. Patient stated full name and DOB. Patient gave permission to continue with virtual visit. Patient's location was at home and Nurse's location was at [place]  office.   Health Screenings  Mammogram - 04/2016 Colonoscopy - 12/2016; scheduled 12/2018 Bone Density - plans to schedule Glaucoma -none Hearing -demonstrates normal hearing during visit. Labs followed by pcp Dental- UTD Vision- visits within the last 12 months.  Social  Alcohol intake - yes      Smoking history- current; not ready to quit Smokers in home? self Illicit drug use? none Exercise - walking 20 min daily, tai-chi twice weekly, stretching Diet - regular Sexually Active -not currently BMI- discussed the importance of a healthy diet, water intake and the benefits of aerobic exercise.  Educational material provided.   Safety  Patient feels  safe at home- yes Patient does have smoke detectors at home- yes Patient does wear sunscreen or protective clothing when in direct sunlight -yes Patient does wear seat belt when in a moving vehicle -yes Patient drives- yes  [id number] precautions and sickness symptoms discussed.   Activities of Daily Living Patient denies needing assistance with: driving, household chores, feeding themselves, getting from bed to chair, getting to the toilet, bathing/showering, dressing, managing money, or preparing meals.  No new identified risk were noted.    Depression Screen Followed by pcp. Awaiting counseling appointment.   Medication-taking as directed and without issues.   Fall Screen Patient denies being afraid of falling or falling in the last year.   Memory Screen Patient is alert.  Patient denies difficulty focusing, concentrating or misplacing items. Correctly identified the president of the [place], season and recall. Patient likes to read, word search, plans to learn a new language and participates in conference calls for brain stimulation.  Immunizations The following Immunizations were discussed: Influenza, shingles, pneumonia, and tetanus.   Other Providers Patient Care Team: [name], MD as PCP - General (Internal  Medicine)  Hearing/Vision screen  Hearing Screening   _0  _1  _2  _3  _4  _5  _6  _7  _8   Right ear:           Left ear:           Comments: Patient is able to hear conversational tones without difficulty.  No issues reported.  Vision Screening Comments: Cataract extraction, bilateral Visual acuity not assessed, virtual visit.  They have seen their ophthalmologist in the last 12 months.     Dietary issues and exercise activities discussed: Current Exercise Habits: Home exercise routine, Type of exercise: walking;stretching, Time (Minutes): 20, Frequency (Times/Week): 5, Weekly Exercise (Minutes/Week): 100  Goals      Patient Stated   . DIET - EAT MORE FRUITS AND VEGETABLES (pt-stated)      Depression Screen PHQ 2/9 Scores 11/16/2018 10/28/2018  PHQ - 2 Score 1 1  PHQ- 9 Score 4 6    Fall Risk Fall Risk  11/16/2018 10/28/2018  Falls in the past year? 0 0  Is the patient's home free of loose throw rugs in walkways, pet beds, electrical cords, etc?  yes      Grab bars in the bathroom? yes      Handrails on the stairs? yes      Adequate lighting? yes  Cognitive Function:     6CIT Screen 11/16/2018  What Year? 0 points  What month? 0 points  What time? 0 points  Count back from 20 0 points  Months in reverse 0 points    Screening Tests Health Maintenance  Topic Date Due  . Hepatitis C Screening  [birth year]  . TETANUS/TDAP  01/09/1968  . DEXA SCAN  01/08/2014  . PNA vac Low Risk Adult (1 of 2 - PCV13) 01/08/2014  . MAMMOGRAM  04/10/2017  . INFLUENZA VACCINE  11/07/2018  . COLONOSCOPY  12/26/2021     Plan:    End of life planning; Advance aging; Advanced directives discussed.  Copy of current HCPOA/Living Will requested.    I have personally reviewed and noted the following in the patient's chart:   . Medical and social history . Use of alcohol, tobacco or illicit drugs  . Current medications and supplements . Functional ability and  status . Nutritional status . Physical activity . Advanced directives . List of other physicians . Hospitalizations, surgeries, and ER visits in previous  12 months . Vitals . Screenings to include cognitive, depression, and falls . Referrals and appointments  In addition, I have reviewed and discussed with patient certain preventive protocols, quality metrics, and best practice recommendations. A written personalized care plan for preventive services as well as general preventive health recommendations were provided to patient.     OBrien-Blaney, Gabrien Mentink L, LPN   2/64/1583    I have reviewed the above information and agree with above.   Deborra Medina, MD

## 2018-11-18 ENCOUNTER — Encounter: Payer: Self-pay | Admitting: *Deleted

## 2018-11-19 ENCOUNTER — Ambulatory Visit: Payer: Medicare HMO | Admitting: Registered Nurse

## 2018-11-19 ENCOUNTER — Ambulatory Visit
Admission: RE | Admit: 2018-11-19 | Discharge: 2018-11-19 | Disposition: A | Discharge status: Home / Self Care | Payer: Medicare HMO | Attending: Gastroenterology | Admitting: Gastroenterology

## 2018-11-19 ENCOUNTER — Other Ambulatory Visit: Payer: Self-pay

## 2018-11-19 ENCOUNTER — Encounter
Admission: RE | Disposition: A | Discharge status: Home / Self Care | Payer: Self-pay | Source: Home / Self Care | Attending: Gastroenterology

## 2018-11-19 DIAGNOSIS — I2510 Atherosclerotic heart disease of native coronary artery without angina pectoris: Secondary | ICD-10-CM | POA: Insufficient documentation

## 2018-11-19 DIAGNOSIS — D125 Benign neoplasm of sigmoid colon: Secondary | ICD-10-CM | POA: Insufficient documentation

## 2018-11-19 DIAGNOSIS — K5730 Diverticulosis of large intestine without perforation or abscess without bleeding: Secondary | ICD-10-CM | POA: Insufficient documentation

## 2018-11-19 DIAGNOSIS — F17210 Nicotine dependence, cigarettes, uncomplicated: Secondary | ICD-10-CM | POA: Diagnosis not present

## 2018-11-19 DIAGNOSIS — Z85828 Personal history of other malignant neoplasm of skin: Secondary | ICD-10-CM | POA: Diagnosis not present

## 2018-11-19 DIAGNOSIS — Z09 Encounter for follow-up examination after completed treatment for conditions other than malignant neoplasm: Secondary | ICD-10-CM | POA: Diagnosis present

## 2018-11-19 DIAGNOSIS — K640 First degree hemorrhoids: Secondary | ICD-10-CM | POA: Diagnosis not present

## 2018-11-19 DIAGNOSIS — E0590 Thyrotoxicosis, unspecified without thyrotoxic crisis or storm: Secondary | ICD-10-CM | POA: Insufficient documentation

## 2018-11-19 DIAGNOSIS — Z79899 Other long term (current) drug therapy: Secondary | ICD-10-CM | POA: Insufficient documentation

## 2018-11-19 DIAGNOSIS — Z86010 Personal history of colonic polyps: Secondary | ICD-10-CM | POA: Insufficient documentation

## 2018-11-19 DIAGNOSIS — D122 Benign neoplasm of ascending colon: Secondary | ICD-10-CM | POA: Diagnosis not present

## 2018-11-19 HISTORY — PX: COLONOSCOPY WITH PROPOFOL: SHX5780

## 2018-11-19 SURGERY — COLONOSCOPY WITH PROPOFOL
Anesthesia: General

## 2018-11-19 MED ORDER — SODIUM CHLORIDE 0.9 % IV SOLN
INTRAVENOUS | Status: DC
  Administered 2018-11-19: 08:00:00 via INTRAVENOUS

## 2018-11-19 MED ORDER — PHENYLEPHRINE HCL (PRESSORS) 10 MG/ML IV SOLN
INTRAVENOUS | Status: DC | PRN
  Administered 2018-11-19: 100 ug via INTRAVENOUS

## 2018-11-19 MED ORDER — PROPOFOL 10 MG/ML IV BOLUS
INTRAVENOUS | Status: DC | PRN
  Administered 2018-11-19: 60 mg via INTRAVENOUS

## 2018-11-19 MED ORDER — PROPOFOL 500 MG/50ML IV EMUL
INTRAVENOUS | Status: DC | PRN
  Administered 2018-11-19: 140 ug/kg/min via INTRAVENOUS

## 2018-11-19 MED ORDER — LIDOCAINE HCL (CARDIAC) PF 100 MG/5ML IV SOSY
PREFILLED_SYRINGE | INTRAVENOUS | Status: DC | PRN
  Administered 2018-11-19: 60 mg via INTRAVENOUS

## 2018-11-19 NOTE — Anesthesia Post-op Follow-up Note (Signed)
Anesthesia QCDR form completed.        

## 2018-11-19 NOTE — Anesthesia Preprocedure Evaluation (Addendum)
Anesthesia Evaluation  Patient identified by MRN, date of birth, ID band Patient awake    Reviewed: Allergy & Precautions, H&P , NPO status , Patient's Chart, lab work & pertinent test results  Airway Mallampati: II  TM Distance: >3 FB     Dental  (+) Upper Dentures, Lower Dentures   Pulmonary neg pulmonary ROS, Current Smoker,           Cardiovascular + CAD  + dysrhythmias (palpitations with stress, on beta blocker) + Valvular Problems/Murmurs (TR) MVP      Neuro/Psych negative neurological ROS  negative psych ROS   GI/Hepatic negative GI ROS, Neg liver ROS,   Endo/Other  Hyperthyroidism   Renal/GU negative Renal ROS  negative genitourinary   Musculoskeletal  (+) Arthritis ,   Abdominal   Peds  Hematology negative hematology ROS (+)   Anesthesia Other Findings Past Medical History: No date: Allergies No date: Arthritis     Comment:  right knee No date: Hypothyroidism No date: MVP (mitral valve prolapse) No date: Osteopenia No date: Palpitations     Comment:  "stress related" No date: Skin cancer No date: Tricuspid valve prolapse No date: Wears dentures     Comment:  partial upper and lower  Past Surgical History: No date: breat biopsy 09/22/2018: CATARACT EXTRACTION W/PHACO; Right     Comment:  Procedure: CATARACT EXTRACTION PHACO AND INTRAOCULAR               LENS PLACEMENT ([place])  RIGHT panooptix;  Surgeon:               Birder Robson, MD;  Location: [place];                Service: Ophthalmology;  Laterality: Right; 10/06/2018: CATARACT EXTRACTION W/PHACO; Left     Comment:  Procedure: CATARACT EXTRACTION PHACO AND INTRAOCULAR               LENS PLACEMENT ([place]) LEFT PANOPTIX LENS;  Surgeon:               Birder Robson, MD;  Location: [place];                Service: Ophthalmology;  Laterality: Left; No date: COLONOSCOPY No date: ORIF FOOT FRACTURE  BMI    Body Mass  Index: 20.63 kg/m      Reproductive/Obstetrics negative OB ROS                           Anesthesia Physical Anesthesia Plan  ASA: II  Anesthesia Plan: General   Post-op Pain Management:    Induction:   PONV Risk Score and Plan: Propofol infusion and TIVA  Airway Management Planned: Natural Airway and Nasal Cannula  Additional Equipment:   Intra-op Plan:   Post-operative Plan:   Informed Consent: I have reviewed the patients History and Physical, chart, labs and discussed the procedure including the risks, benefits and alternatives for the proposed anesthesia with the patient or authorized representative who has indicated his/her understanding and acceptance.     Dental Advisory Given  Plan Discussed with: Anesthesiologist and CRNA  Anesthesia Plan Comments:        Anesthesia Quick Evaluation

## 2018-11-19 NOTE — H&P (Signed)
Jonathon Bellows, MD [address] [place], [place], [place], [address] [address], [place], [place], [place], [address] Phone: [phone]  Fax: [phone]  Primary Care Physician:  [name], MD   Pre-Procedure History & Physical: HPI:  Martha Wilson is a 68 y.o. female is here for an colonoscopy.   Past Medical History:  Diagnosis Date  . Allergies   . Arthritis    right knee  . Hypothyroidism   . MVP (mitral valve prolapse)   . Osteopenia   . Palpitations    "stress related"  . Skin cancer   . Tricuspid valve prolapse   . Wears dentures    partial upper and lower    Past Surgical History:  Procedure Laterality Date  . breat biopsy    . CATARACT EXTRACTION W/PHACO Right 09/22/2018   Procedure: CATARACT EXTRACTION PHACO AND INTRAOCULAR LENS PLACEMENT ([place])  RIGHT panooptix;  Surgeon: Birder Robson, MD;  Location: [facility];  Service: Ophthalmology;  Laterality: Right;  . CATARACT EXTRACTION W/PHACO Left 10/06/2018   Procedure: CATARACT EXTRACTION PHACO AND INTRAOCULAR LENS PLACEMENT ([facility]) LEFT PANOPTIX LENS;  Surgeon: Birder Robson, MD;  Location: [facility];  Service: Ophthalmology;  Laterality: Left;  . COLONOSCOPY    . ORIF FOOT FRACTURE      Prior to Admission medications   Medication Sig Start Date End Date Taking? Authorizing Provider  methimazole (TAPAZOLE) 5 MG tablet Take 5 mg by mouth daily.   Yes [provider]  Multiple Vitamins-Minerals (PRESERVISION AREDS) CAPS Take by mouth daily.   Yes [provider]  timolol (TIMOPTIC) 0.25 % ophthalmic solution Place 1 drop into both eyes daily.   Yes [provider]    Allergies as of 11/02/2018 - Review Complete 10/28/2018  Allergen Reaction Noted  . Bee venom Anaphylaxis 01/16/2016  . Penicillins  04/20/2018  . Adhesive [tape] Rash 09/15/2018    Family History  Problem Relation Age of Onset  . Heart attack Father   . Thyroid disease  Brother   . Early death Brother     Social History   Socioeconomic History  . Marital status: Single    Spouse name: Not on file  . Number of children: Not on file  . Years of education: Not on file  . Highest education level: Not on file  Occupational History  . Not on file  Social Needs  . Financial resource strain: Not hard at all  . Food insecurity    Worry: Never true    Inability: Never true  . Transportation needs    Medical: No    Non-medical: No  Tobacco Use  . Smoking status: Current Some Day Smoker  . Smokeless tobacco: Never Used  . Tobacco comment: "occasional" cigarette  Substance and Sexual Activity  . Alcohol use: Yes    Alcohol/week: 3.0 standard drinks    Types: 3 Glasses of wine per week    Comment: none last 24hrs  . Drug use: Never  . Sexual activity: Not Currently  Lifestyle  . Physical activity    Days per week: 7 days    Minutes per session: 20 min  . Stress: Not at all  Relationships  . Social Herbalist on phone: Not on file    Gets together: Not on file    Attends religious service: Not on file    Active member of club or organization: Not on file    Attends meetings of clubs or organizations:  Not on file    Relationship status: Not on file  . Intimate partner violence    Fear of current or ex partner: Not on file    Emotionally abused: Not on file    Physically abused: Not on file    Forced sexual activity: Not on file  Other Topics Concern  . Not on file  Social History Narrative  . Not on file    Review of Systems: See HPI, otherwise negative ROS  Physical Exam: BP (!) 116/92   Pulse (!) 119   Temp (!) 96.7 F (35.9 C) (Tympanic)   Resp 16   Ht 5\' 5"  (1.651 m)   Wt 56.2 kg   SpO2 99%   BMI 20.63 kg/m  General:   Alert,  pleasant and cooperative in NAD Head:  Normocephalic and atraumatic. Neck:  Supple; no masses or thyromegaly. Lungs:  Clear throughout to auscultation, normal respiratory effort.     Heart:  +S1, +S2, Regular rate and rhythm, No edema. Abdomen:  Soft, nontender and nondistended. Normal bowel sounds, without guarding, and without rebound.   Neurologic:  Alert and  oriented x4;  grossly normal neurologically.  Impression/Plan: Martha Wilson is here for an colonoscopy to be performed for surveillance due to prior history of colon polyps   Risks, benefits, limitations, and alternatives regarding  colonoscopy have been reviewed with the patient.  Questions have been answered.  All parties agreeable.   Jonathon Bellows, MD  11/19/2018, 8:47 AM

## 2018-11-19 NOTE — Transfer of Care (Signed)
Immediate Anesthesia Transfer of Care Note  Patient: Martha Wilson  Procedure(s) Performed: COLONOSCOPY WITH PROPOFOL (N/A )  Patient Location: PACU  Anesthesia Type:General  Level of Consciousness: awake, alert  and oriented  Airway & Oxygen Therapy: Patient Spontanous Breathing  Post-op Assessment: Report given to RN and Post -op Vital signs reviewed and stable  Post vital signs: Reviewed and stable  Last Vitals:  Vitals Value Taken Time  BP 89/57 11/19/18 0915  Temp 36.3 C 11/19/18 0915  Pulse 87 11/19/18 0915  Resp 27 11/19/18 0915  SpO2 98 % 11/19/18 0915  Vitals shown include unvalidated device data.  Last Pain:  Vitals:   11/19/18 0915  TempSrc:   PainSc: 0-No pain         Complications: No apparent anesthesia complications

## 2018-11-19 NOTE — Op Note (Signed)
[facility] Gastroenterology Patient Name: Martha Wilson Procedure Date: 11/19/2018 8:49 AM MRN: [id number] Account #: [id number] Date of Birth: December 26, 1950 Admit Type: Outpatient Age: 68 Room: [facility] ENDO ROOM 4 Gender: Female Note Status: Finalized Procedure:            Colonoscopy Indications:          High risk colon cancer surveillance: Personal history                        of colonic polyps Providers:            Jonathon Bellows MD, MD Referring MD:         Deborra Medina, MD (Referring MD) Medicines:            Monitored Anesthesia Care Complications:        No immediate complications. Procedure:            Pre-Anesthesia Assessment:                       - Prior to the procedure, a History and Physical was                        performed, and patient medications, allergies and                        sensitivities were reviewed. The patient's tolerance of                        previous anesthesia was reviewed.                       - The risks and benefits of the procedure and the                        sedation options and risks were discussed with the                        patient. All questions were answered and informed                        consent was obtained.                       - ASA Grade Assessment: II - A patient with mild                        systemic disease.                       After obtaining informed consent, the colonoscope was                        passed under direct vision. Throughout the procedure,                        the patient's blood pressure, pulse, and oxygen                        saturations were monitored continuously. The                        Colonoscope  was introduced through the anus and                        advanced to the the terminal ileum. The colonoscopy was                        performed with ease. The patient tolerated the                        procedure well. The quality of the bowel preparation                         was excellent. Findings:      The perianal and digital rectal examinations were normal.      Four sessile polyps were found in the distal ascending colon. The polyps       were 5 to 9 mm in size. These polyps were removed with a cold snare.       Resection and retrieval were complete. To prevent bleeding after the       polypectomy, one hemostatic clip was successfully placed. There was no       bleeding during, or at the end, of the procedure.      A 5 mm polyp was found in the sigmoid colon. The polyp was sessile. The       polyp was removed with a cold snare. Resection and retrieval were       complete.      Multiple small-mouthed diverticula were found in the sigmoid colon.      Non-bleeding internal hemorrhoids were found during retroflexion. The       hemorrhoids were medium-sized and Grade I (internal hemorrhoids that do       not prolapse).      The exam was otherwise without abnormality on direct and retroflexion       views. Impression:           - Four 5 to 9 mm polyps in the distal ascending colon,                        removed with a cold snare. Resected and retrieved. Clip                        was placed.                       - One 5 mm polyp in the sigmoid colon, removed with a                        cold snare. Resected and retrieved.                       - Diverticulosis in the sigmoid colon.                       - Non-bleeding internal hemorrhoids.                       - The examination was otherwise normal on direct and                        retroflexion views. Recommendation:       -  Discharge patient to home (with escort).                       - Resume previous diet.                       - Continue present medications.                       - Await pathology results.                       - Repeat colonoscopy in 3 years for surveillance. Procedure Code(s):    --- Professional ---                       [id number], Colonoscopy, flexible;  with removal of tumor(s),                        polyp(s), or other lesion(s) by snare technique Diagnosis Code(s):    --- Professional ---                       K63.5, Polyp of colon                       Z86.010, Personal history of colonic polyps                       K64.0, First degree hemorrhoids                       K57.30, Diverticulosis of large intestine without                        perforation or abscess without bleeding CPT copyright 2019 American Medical Association. All rights reserved. The codes documented in this report are preliminary and upon coder review may  be revised to meet current compliance requirements. Jonathon Bellows, MD Jonathon Bellows MD, MD 11/19/2018 9:13:44 AM This report has been signed electronically. Number of Addenda: 0 Note Initiated On: 11/19/2018 8:49 AM Scope Withdrawal Time: 0 hours 14 minutes 57 seconds  Total Procedure Duration: 0 hours 17 minutes 50 seconds  Estimated Blood Loss: Estimated blood loss: none.      [facility]

## 2018-11-20 ENCOUNTER — Encounter: Payer: Self-pay | Admitting: Gastroenterology

## 2018-11-20 LAB — SURGICAL PATHOLOGY

## 2018-11-20 NOTE — Anesthesia Postprocedure Evaluation (Signed)
Anesthesia Post Note  Patient: Martha Wilson  Procedure(s) Performed: COLONOSCOPY WITH PROPOFOL (N/A )  Patient location during evaluation: PACU Anesthesia Type: General Level of consciousness: awake and alert Pain management: pain level controlled Vital Signs Assessment: post-procedure vital signs reviewed and stable Respiratory status: spontaneous breathing, nonlabored ventilation and respiratory function stable Cardiovascular status: blood pressure returned to baseline and stable Postop Assessment: no apparent nausea or vomiting Anesthetic complications: no     Last Vitals:  Vitals:   11/19/18 0930 11/19/18 0940  BP: (!) 89/68 (!) 101/45  Pulse: 68   Resp: 16 19  Temp:    SpO2: 100% 100%    Last Pain:  Vitals:   11/20/18 0728  TempSrc:   PainSc: 0-No pain                 Durenda Hurt

## 2018-11-21 ENCOUNTER — Encounter: Payer: Self-pay | Admitting: Internal Medicine

## 2018-11-21 DIAGNOSIS — D126 Benign neoplasm of colon, unspecified: Secondary | ICD-10-CM | POA: Insufficient documentation

## 2018-11-22 ENCOUNTER — Encounter: Payer: Self-pay | Admitting: Gastroenterology

## 2018-12-17 ENCOUNTER — Ambulatory Visit
Admission: RE | Admit: 2018-12-17 | Discharge: 2018-12-17 | Disposition: A | Discharge status: Home / Self Care | Payer: Medicare HMO | Source: Ambulatory Visit | Attending: Internal Medicine | Admitting: Internal Medicine

## 2018-12-17 DIAGNOSIS — Z1231 Encounter for screening mammogram for malignant neoplasm of breast: Secondary | ICD-10-CM | POA: Diagnosis present

## 2018-12-17 IMAGING — MG MM DIGITAL SCREENING BILAT W/ TOMO W/ CAD
6 of 12 series · 6 of 36 positions shown · non-contrast
Comparison: None.

CLINICAL DATA: Screening.

EXAM:
DIGITAL SCREENING BILATERAL MAMMOGRAM WITH TOMO AND CAD

[L MLO synth-2D (1 of 2)]
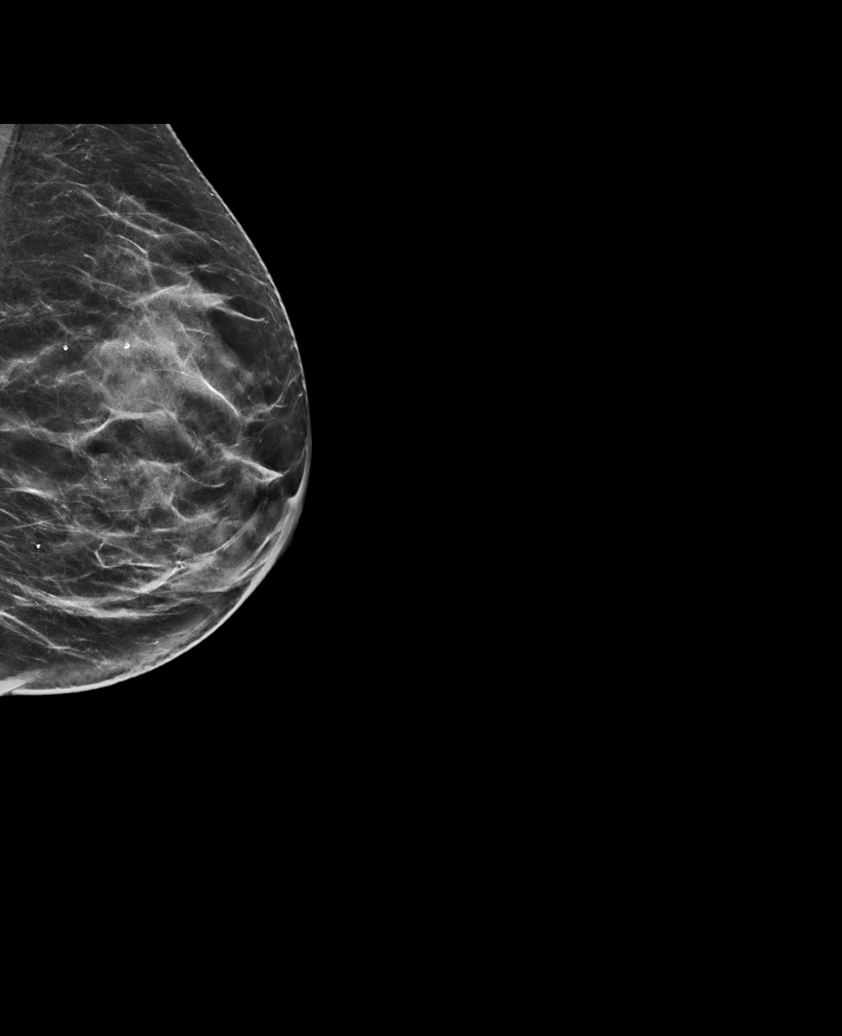

[R CC synth-2D]
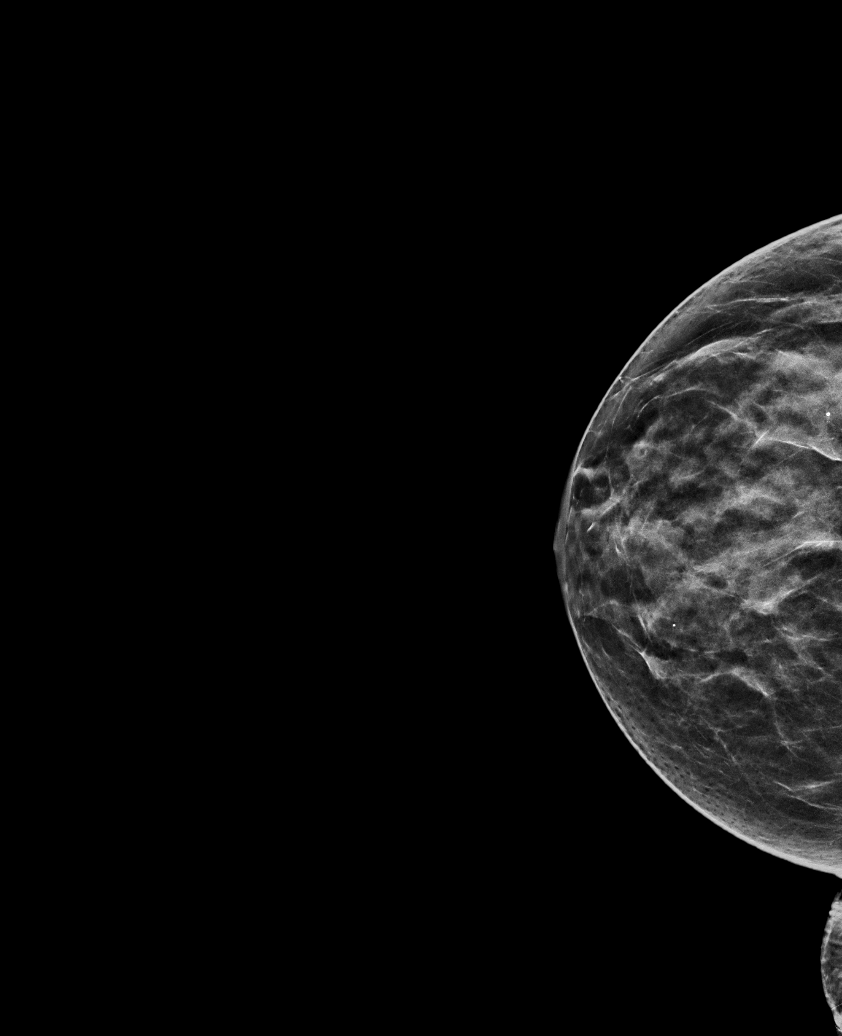

[R MLO synth-2D (1 of 2)]
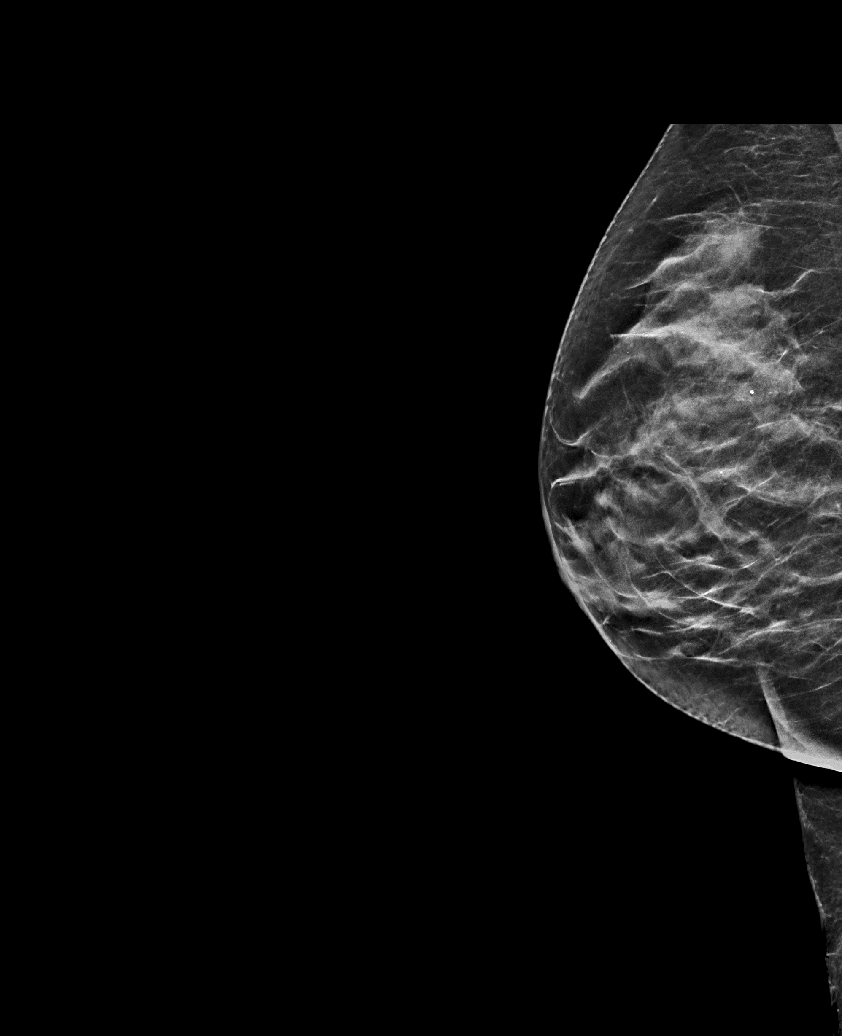

[L MLO synth-2D (2 of 2)]
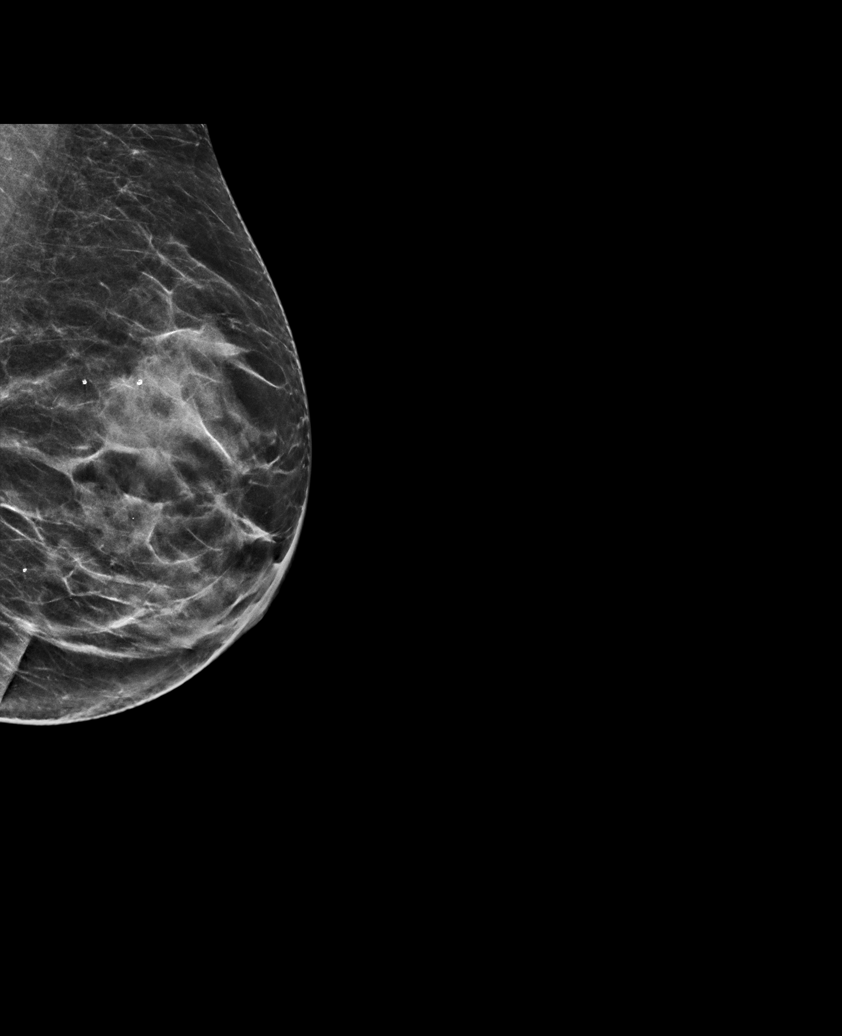

[R MLO synth-2D (2 of 2)]
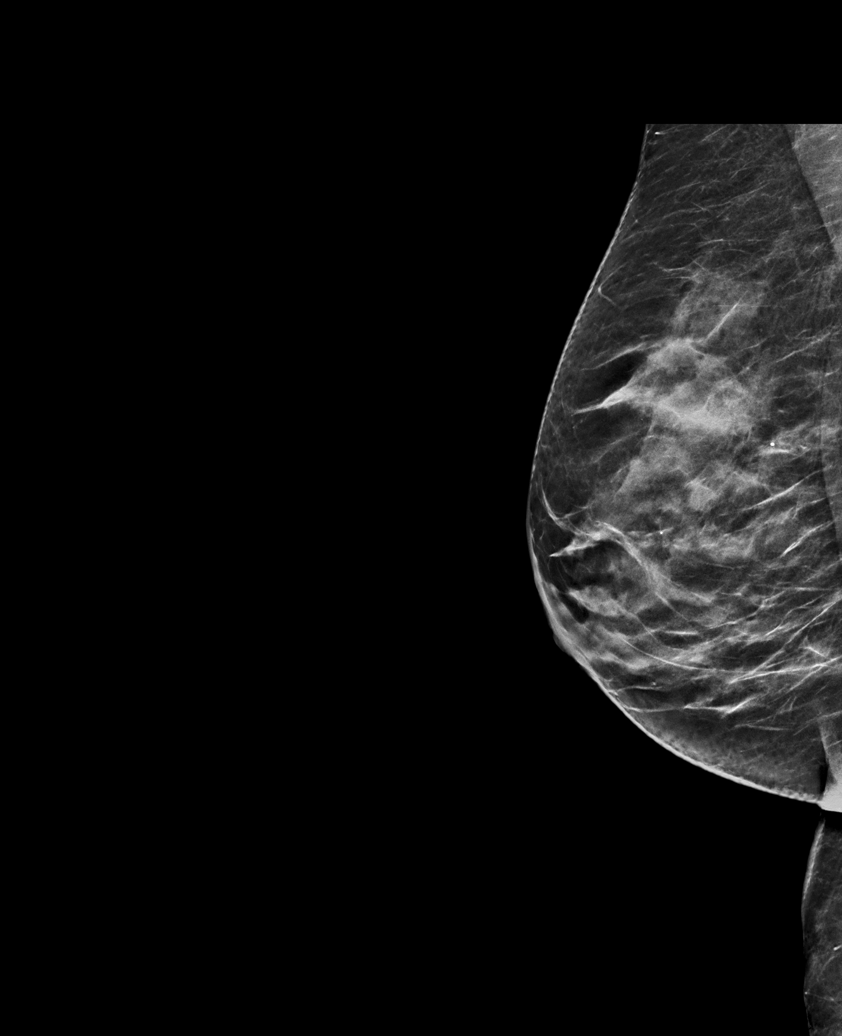

[L CC synth-2D]
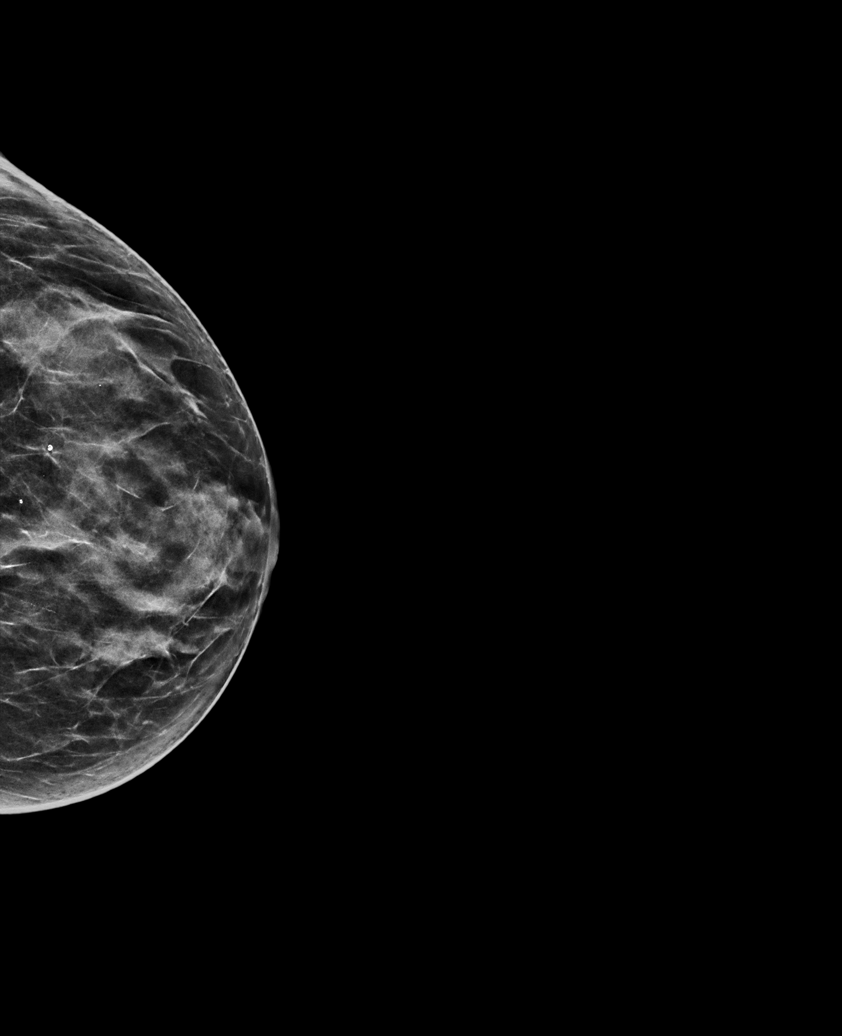

[6 of 36 positions shown; findings below may reference images not displayed]

ACR Breast Density Category c: The breast tissue is heterogeneously
dense, which may obscure small masses
FINDINGS: There are no findings suspicious for malignancy. Images were
processed with CAD.
IMPRESSION: No mammographic evidence of malignancy. A result letter of this
screening mammogram will be mailed directly to the patient.

RECOMMENDATION:
Screening mammogram in one year. (Code:[4W])

BI-RADS CATEGORY  1: Negative.

## 2019-01-11 ENCOUNTER — Encounter: Payer: Self-pay | Admitting: Cardiology

## 2019-01-12 ENCOUNTER — Encounter: Payer: Self-pay | Admitting: Cardiology

## 2019-02-15 ENCOUNTER — Ambulatory Visit (INDEPENDENT_AMBULATORY_CARE_PROVIDER_SITE_OTHER): Payer: Medicare HMO

## 2019-02-15 ENCOUNTER — Telehealth: Payer: Self-pay | Admitting: Internal Medicine

## 2019-02-15 ENCOUNTER — Other Ambulatory Visit: Payer: Self-pay

## 2019-02-15 DIAGNOSIS — Z23 Encounter for immunization: Secondary | ICD-10-CM

## 2019-02-15 NOTE — Telephone Encounter (Signed)
Please let me know if I need to do anything

## 2019-02-15 NOTE — Telephone Encounter (Signed)
Pt wanted an order/referral  sent to [facility] for the Gait program

## 2019-02-15 NOTE — Telephone Encounter (Signed)
I have to have a reason for the referral.  There is nothing in her chart that suggests we have addressed this problem  Can she provide a little more information, or schedule a visit to discuss?

## 2019-02-16 NOTE — Telephone Encounter (Signed)
Left voicemail for pt to call & schedule an appt with PCP to discuss referral.

## 2019-02-17 NOTE — Telephone Encounter (Signed)
Pt returned vm to schedule in office appt with Dr. Derrel Nip. No availability until [employer]. Pt prefers to be seen in office due to insurance not covering virtual/telephone. Pt stated she is having trouble with balance and walking which is the reason for the referral. Stated Donita Brooks is the PT Director at [facility] and is aware.

## 2019-03-09 ENCOUNTER — Telehealth: Payer: Self-pay | Admitting: Internal Medicine

## 2019-03-09 NOTE — Telephone Encounter (Signed)
Copied from [place] [phone]. Topic: Quick Communication - Home Health Verbal Orders >> Mar 09, 2019 10:02 AM Yvette Rack wrote: Caller/Agency: Maurine Simmering with [facility] Callback Number: [phone] Requesting OT/PT/Skilled Nursing/Social Work/Speech Therapy: PT Frequency: 3 times a week for 12 weeks for balance and falls

## 2019-03-09 NOTE — Telephone Encounter (Signed)
Spoke with Medical sales representative at [facility].  Requesting written orders be faxed for PT 3 times a week for 12 weeks for balance and falls.  Please fax to:  [phone]  Attn:  Donita Brooks

## 2019-03-10 NOTE — Telephone Encounter (Signed)
Yes, please ,  Thank you!

## 2019-03-10 NOTE — Telephone Encounter (Signed)
Is it okay if I go ahead and type this up and fax?

## 2019-03-11 NOTE — Telephone Encounter (Signed)
Verbal orders have been typed and faxed to number provided below.

## 2019-04-14 ENCOUNTER — Ambulatory Visit: Payer: Medicare HMO | Admitting: Internal Medicine

## 2019-08-26 ENCOUNTER — Other Ambulatory Visit: Payer: Self-pay | Admitting: Orthopedic Surgery

## 2019-08-26 DIAGNOSIS — M4807 Spinal stenosis, lumbosacral region: Secondary | ICD-10-CM

## 2019-08-26 DIAGNOSIS — M5442 Lumbago with sciatica, left side: Secondary | ICD-10-CM

## 2019-08-26 DIAGNOSIS — M5136 Other intervertebral disc degeneration, lumbar region: Secondary | ICD-10-CM

## 2019-08-27 ENCOUNTER — Ambulatory Visit
Admission: RE | Admit: 2019-08-27 | Discharge: 2019-08-27 | Disposition: A | Discharge status: Home / Self Care | Payer: Medicare HMO | Source: Ambulatory Visit | Attending: Orthopedic Surgery | Admitting: Orthopedic Surgery

## 2019-08-27 ENCOUNTER — Other Ambulatory Visit: Payer: Self-pay

## 2019-08-27 DIAGNOSIS — M5136 Other intervertebral disc degeneration, lumbar region: Secondary | ICD-10-CM | POA: Diagnosis present

## 2019-08-27 DIAGNOSIS — M5442 Lumbago with sciatica, left side: Secondary | ICD-10-CM

## 2019-08-27 DIAGNOSIS — M4807 Spinal stenosis, lumbosacral region: Secondary | ICD-10-CM | POA: Insufficient documentation
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
## 2019-09-23 ENCOUNTER — Telehealth: Payer: Self-pay | Admitting: Internal Medicine

## 2019-09-23 NOTE — Telephone Encounter (Signed)
Pt returned call

## 2019-09-23 NOTE — Telephone Encounter (Signed)
Pt would like to speak with a nurse about getting an appt. Pt would not go into detail about what it was regarding   Please advise

## 2019-09-23 NOTE — Telephone Encounter (Signed)
LMTCB

## 2019-09-23 NOTE — Telephone Encounter (Signed)
Spoke with pt and scheduled her an appt so she could discuss her appt she had with Dr. Rudene Christians.

## 2019-10-01 ENCOUNTER — Telehealth (INDEPENDENT_AMBULATORY_CARE_PROVIDER_SITE_OTHER): Payer: Medicare HMO | Admitting: Internal Medicine

## 2019-10-01 ENCOUNTER — Encounter: Payer: Self-pay | Admitting: Internal Medicine

## 2019-10-01 DIAGNOSIS — R2689 Other abnormalities of gait and mobility: Secondary | ICD-10-CM | POA: Diagnosis not present

## 2019-10-01 DIAGNOSIS — M5430 Sciatica, unspecified side: Secondary | ICD-10-CM | POA: Insufficient documentation

## 2019-10-01 DIAGNOSIS — M5432 Sciatica, left side: Secondary | ICD-10-CM | POA: Diagnosis not present
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
## 2019-10-03 DIAGNOSIS — R2689 Other abnormalities of gait and mobility: Secondary | ICD-10-CM | POA: Insufficient documentation

## 2019-10-03 NOTE — Assessment & Plan Note (Signed)
Pain management  with multilevel ESI in process . Has a second appt with Dr Loistine Chance in late July for right sided injections . She denies foot drop but does report tingling sensation in both feet.

## 2019-10-03 NOTE — Assessment & Plan Note (Signed)
Difficult to assess via video visit but history suggests multifactorial (lack of core/proximal muscle strength, orthostasis and possible neuropathy).  Will rule out deficiencies and resume PT .  Given the lack of headache and vertigo history ,  I do not seen an indication got MRI Brain at this time.

## 2019-10-19 ENCOUNTER — Telehealth: Payer: Self-pay | Admitting: Internal Medicine

## 2019-10-19 DIAGNOSIS — R2689 Other abnormalities of gait and mobility: Secondary | ICD-10-CM

## 2019-10-19 NOTE — Telephone Encounter (Signed)
Martha Wilson of [facility] Fax number is [phone]  pt need a referral for therapy

## 2019-10-19 NOTE — Telephone Encounter (Signed)
Patient needs order for PT faxed to [place] [facility] .  See referral information .  Thank you

## 2019-10-22 NOTE — Telephone Encounter (Signed)
[facility] called to check on status of referral

## 2019-11-08 ENCOUNTER — Encounter: Payer: Self-pay | Admitting: Internal Medicine

## 2019-11-08 ENCOUNTER — Telehealth (INDEPENDENT_AMBULATORY_CARE_PROVIDER_SITE_OTHER): Payer: Medicare HMO | Admitting: Internal Medicine

## 2019-11-08 VITALS — BP 123/79 | HR 84 | Ht 65.0 in | Wt 128.0 lb

## 2019-11-08 DIAGNOSIS — R42 Dizziness and giddiness: Secondary | ICD-10-CM | POA: Insufficient documentation

## 2019-11-08 DIAGNOSIS — R2689 Other abnormalities of gait and mobility: Secondary | ICD-10-CM

## 2019-11-08 DIAGNOSIS — I951 Orthostatic hypotension: Secondary | ICD-10-CM

## 2019-11-08 DIAGNOSIS — E0590 Thyrotoxicosis, unspecified without thyrotoxic crisis or storm: Secondary | ICD-10-CM

## 2019-11-08 DIAGNOSIS — I2583 Coronary atherosclerosis due to lipid rich plaque: Secondary | ICD-10-CM

## 2019-11-08 DIAGNOSIS — R002 Palpitations: Secondary | ICD-10-CM | POA: Diagnosis not present

## 2019-11-08 DIAGNOSIS — I2510 Atherosclerotic heart disease of native coronary artery without angina pectoris: Secondary | ICD-10-CM

## 2019-11-08 DIAGNOSIS — E559 Vitamin D deficiency, unspecified: Secondary | ICD-10-CM

## 2019-11-08 NOTE — Progress Notes (Signed)
Virtual Visit via [place]  This visit type was conducted due to national recommendations for restrictions regarding the COVID-19 pandemic (e.g. social distancing).  This format is felt to be most appropriate for this patient at this time.  All issues noted in this document were discussed and addressed.  No physical exam was performed (except for noted visual exam findings with Video Visits).   I connected with@ on 11/08/19 at  4:30 PM EDT by a video enabled telemedicine application or telephone and verified that I am speaking with the correct person using two identifiers. Location patient: home Location provider: work or home office Persons participating in the virtual visit: patient, provider  I discussed the limitations, risks, security and privacy concerns of performing an evaluation and management service by telephone and the availability of in person appointments. I also discussed with the patient that there may be a patient responsible charge related to this service. The patient expressed understanding and agreed to proceed.   Reason for visit: follow up on balance issues  HPI:   69 yr old female with history of balance disorder , Had PT for 2 weeks while in [place] felt a significant improvement in balance .  Her therapy included  work on ankles and proximal muscles to improve strength.  PT used Used a Research scientist (medical) to prevent falls,  Felt like she was parachuting. Used  Weights on legs, and  Had her Walking on foam. Home now in [place] to celebrate  mother 46 73th birthday .  Wants to continue  PT with Estill Bamberg at [facility] while here so she doesn't lose ground   Continues to feel  dizzy with exertion and after standing up suddenly. Lasts about 2 minutes,  Only happened during PT in [place].   Has decided to have an appt with neurologist Aug 31  Shah  PT therapist in [facility]  worried about atrial fibrillation bc it was occurring during exertion . Patient's cardiac history includes CAD and  tricuspid regurgitation   ECHOs available by Epic portal but not EKGs.  Home BPs have been < 814 systolic,  At times as low as 481 systolic  Has not checked orthostatic readings yet.   Home bps on a brand new machine a:  112/74,  123/,   100/67    EHU:DJSHFWY denies headache, fevers, malaise, unintentional weight loss, skin rash, eye pain, sinus congestion and sinus pain, sore throat, dysphagia,  hemoptysis , cough, dyspnea, wheezing, chest pain, palpitations, orthopnea, edema, abdominal pain, nausea, melena, diarrhea, constipation, flank pain, dysuria, hematuria, urinary  Frequency, nocturia, numbness, tingling, seizures,  Focal weakness, Loss of consciousness,  Tremor, insomnia, depression, anxiety, and suicidal ideation.      Past Medical History:  Diagnosis Date  . Allergies   . Arthritis    right knee  . Hypothyroidism   . MVP (mitral valve prolapse)   . Osteopenia   . Palpitations    "stress related"  . Skin cancer   . Tricuspid valve prolapse   . Wears dentures    partial upper and lower    Past Surgical History:  Procedure Laterality Date  . BREAST BIOPSY  1980's   pt states she had a bx in the 80's, not sure of side, benign  . breat biopsy    . CATARACT EXTRACTION W/PHACO Right 09/22/2018   Procedure: CATARACT EXTRACTION PHACO AND INTRAOCULAR LENS PLACEMENT ([place])  RIGHT panooptix;  Surgeon: Birder Robson, MD;  Location: [facility];  Service: Ophthalmology;  Laterality:  Right;  Marland Kitchen CATARACT EXTRACTION W/PHACO Left 10/06/2018   Procedure: CATARACT EXTRACTION PHACO AND INTRAOCULAR LENS PLACEMENT ([place]) LEFT PANOPTIX LENS;  Surgeon: Birder Robson, MD;  Location: [facility];  Service: Ophthalmology;  Laterality: Left;  . COLONOSCOPY    . COLONOSCOPY WITH PROPOFOL N/A 11/19/2018   Procedure: COLONOSCOPY WITH PROPOFOL;  Surgeon: Jonathon Bellows, MD;  Location[facility] ENDOSCOPY;  Service: Gastroenterology;  Laterality: N/A;  . ORIF FOOT FRACTURE      Family  History  Problem Relation Age of Onset  . Heart attack Father   . Thyroid disease Brother   . Early death Brother     SOCIAL HX:  reports that she has been smoking. She has never used smokeless tobacco. She reports current alcohol use of about 3.0 standard drinks of alcohol per week. She reports that she does not use drugs.   Current Outpatient Medications:  .  methimazole (TAPAZOLE) 5 MG tablet, Take 5 mg by mouth daily., Disp: , Rfl:  .  Multiple Vitamins-Minerals (PRESERVISION AREDS 2) CAPS, Take by mouth., Disp: , Rfl:   EXAM:  VITALS per patient if applicable:  GENERAL: alert, oriented, appears well and in no acute distress  HEENT: atraumatic, conjunttiva clear, no obvious abnormalities on inspection of external nose and ears  NECK: normal movements of the head and neck  LUNGS: on inspection no signs of respiratory distress, breathing rate appears normal, no obvious gross SOB, gasping or wheezing  CV: no obvious cyanosis  MS: moves all visible extremities without noticeable abnormality  PSYCH/NEURO: pleasant and cooperative, no obvious depression or anxiety, speech and thought processing grossly intact  ASSESSMENT AND PLAN:  Discussed the following assessment and plan:  Balance disorder - Plan: Ambulatory referral to Physical Therapy, RPR  Palpitations - Plan: Comprehensive metabolic panel, Magnesium  Hyperthyroidism - Plan: TSH  Orthostasis - Plan: CBC with Differential/Platelet  Dizziness - Plan: Hemoglobin A1c, Vitamin B12  Vitamin D deficiency - Plan: VITAMIN D 25 Hydroxy (Vit-D Deficiency, Fractures)  Coronary artery disease due to lipid rich plaque - Plan: Lipid panel  Dizziness Episodes lasting 20 sec or less occurring with exertion and position change.  orthostasis suspected given low BPs reported,   vs atrial fib.  Records of old EKGs to be requested (not visible via Epic portal),  Screening labs prior to visit on 18th,  EKG to be done on 18th and  orthostatic vital signs to be done   Balance disorder Improving with PT.  Screening for metabolic neuopathies in progress.  Neurology appt is Aug 31 .  RX for [facility] PT done.     I discussed the assessment and treatment plan with the patient. The patient was provided an opportunity to ask questions and all were answered. The patient agreed with the plan and demonstrated an understanding of the instructions.   The patient was advised to call back or seek an in-person evaluation if the symptoms worsen or if the condition fails to improve as anticipated.  I provided 30 minutes of non-face-to-face time during this encounter.   [name], MD

## 2019-11-08 NOTE — Assessment & Plan Note (Signed)
Episodes lasting 20 sec or less occurring with exertion and position change.  orthostasis suspected given low BPs reported,   vs atrial fib.  Records of old EKGs to be requested (not visible via Epic portal),  Screening labs prior to visit on 18th,  EKG to be done on 18th and orthostatic vital signs to be done

## 2019-11-09 NOTE — Assessment & Plan Note (Signed)
Improving with PT.  Screening for metabolic neuopathies in progress.  Neurology appt is Aug 31 .  RX for [facility] PT done.

## 2019-11-12 ENCOUNTER — Telehealth: Payer: Self-pay | Admitting: Internal Medicine

## 2019-11-12 NOTE — Telephone Encounter (Signed)
Pt said [facility] still haven't received prescription for PT. She is asking will this be sent today? Fax [phone] phone # [phone]

## 2019-11-16 ENCOUNTER — Other Ambulatory Visit (INDEPENDENT_AMBULATORY_CARE_PROVIDER_SITE_OTHER): Payer: Medicare HMO

## 2019-11-16 ENCOUNTER — Other Ambulatory Visit: Payer: Self-pay

## 2019-11-16 ENCOUNTER — Ambulatory Visit: Payer: Medicare HMO

## 2019-11-16 VITALS — BP 114/68 | HR 76 | Temp 98.2°F | Ht 65.0 in | Wt 129.0 lb

## 2019-11-16 DIAGNOSIS — R002 Palpitations: Secondary | ICD-10-CM

## 2019-11-16 DIAGNOSIS — R42 Dizziness and giddiness: Secondary | ICD-10-CM | POA: Diagnosis not present

## 2019-11-16 DIAGNOSIS — Z0000 Encounter for general adult medical examination without abnormal findings: Secondary | ICD-10-CM

## 2019-11-16 DIAGNOSIS — R2689 Other abnormalities of gait and mobility: Secondary | ICD-10-CM

## 2019-11-16 DIAGNOSIS — I2583 Coronary atherosclerosis due to lipid rich plaque: Secondary | ICD-10-CM

## 2019-11-16 DIAGNOSIS — E0590 Thyrotoxicosis, unspecified without thyrotoxic crisis or storm: Secondary | ICD-10-CM

## 2019-11-16 DIAGNOSIS — I951 Orthostatic hypotension: Secondary | ICD-10-CM

## 2019-11-16 DIAGNOSIS — E559 Vitamin D deficiency, unspecified: Secondary | ICD-10-CM | POA: Diagnosis not present

## 2019-11-16 DIAGNOSIS — I2510 Atherosclerotic heart disease of native coronary artery without angina pectoris: Secondary | ICD-10-CM | POA: Diagnosis not present

## 2019-11-16 LAB — COMPREHENSIVE METABOLIC PANEL
ALT: 21 U/L (ref 0–35)
AST: 20 U/L (ref 0–37)
Albumin: 4.3 g/dL (ref 3.5–5.2)
Alkaline Phosphatase: 95 U/L (ref 39–117)
BUN: 13 mg/dL (ref 6–23)
CO2: 26 mEq/L (ref 19–32)
Calcium: 9.7 mg/dL (ref 8.4–10.5)
Chloride: 103 mEq/L (ref 96–112)
Creatinine, Ser: 0.98 mg/dL (ref 0.40–1.20)
GFR: 55.97 mL/min — ABNORMAL LOW (ref >60.00)
Glucose, Bld: 92 mg/dL (ref 70–99)
Potassium: 4.3 mEq/L (ref 3.5–5.1)
Sodium: 139 mEq/L (ref 135–145)
Total Bilirubin: 0.6 mg/dL (ref 0.2–1.2)
Total Protein: 7.4 g/dL (ref 6.0–8.3)

## 2019-11-16 LAB — CBC WITH DIFFERENTIAL/PLATELET
Basophils Absolute: 0.1 10*3/uL (ref 0.0–0.1)
Basophils Relative: 0.8 % (ref 0.0–3.0)
Eosinophils Absolute: 0.1 10*3/uL (ref 0.0–0.7)
Eosinophils Relative: 1.6 % (ref 0.0–5.0)
HCT: 44.9 % (ref 36.0–46.0)
Hemoglobin: 15.4 g/dL — ABNORMAL HIGH (ref 12.0–15.0)
Lymphocytes Relative: 30.6 % (ref 12.0–46.0)
Lymphs Abs: 2.2 10*3/uL (ref 0.7–4.0)
MCHC: 34.3 g/dL (ref 30.0–36.0)
MCV: 101.9 fl — ABNORMAL HIGH (ref 78.0–100.0)
Monocytes Absolute: 0.7 10*3/uL (ref 0.1–1.0)
Monocytes Relative: 10.1 % (ref 3.0–12.0)
Neutro Abs: 4 10*3/uL (ref 1.4–7.7)
Neutrophils Relative %: 56.9 % (ref 43.0–77.0)
Platelets: 283 10*3/uL (ref 150.0–400.0)
RBC: 4.41 Mil/uL (ref 3.87–5.11)
RDW: 13.9 % (ref 11.5–15.5)
WBC: 7.1 10*3/uL (ref 4.0–10.5)

## 2019-11-16 LAB — LIPID PANEL
Cholesterol: 196 mg/dL (ref 0–200)
HDL: 75.1 mg/dL (ref >39.00)
LDL Cholesterol: 94 mg/dL (ref 0–99)
NonHDL: 120.9
Total CHOL/HDL Ratio: 3
Triglycerides: 136 mg/dL (ref 0.0–149.0)
VLDL: 27.2 mg/dL (ref 0.0–40.0)

## 2019-11-16 LAB — VITAMIN D 25 HYDROXY (VIT D DEFICIENCY, FRACTURES): VITD: 38.48 ng/mL (ref 30.00–100.00)

## 2019-11-16 LAB — TSH: TSH: 2.11 u[IU]/mL (ref 0.35–4.50)

## 2019-11-16 LAB — HEMOGLOBIN A1C: Hgb A1c MFr Bld: 5.8 % (ref 4.6–6.5)

## 2019-11-16 LAB — MAGNESIUM: Magnesium: 1.9 mg/dL (ref 1.5–2.5)

## 2019-11-16 LAB — VITAMIN B12: Vitamin B-12: 1388 pg/mL — ABNORMAL HIGH (ref 211–911)

## 2019-11-16 NOTE — Progress Notes (Signed)
Patient came to have vitals taken for an appointment with PCP.

## 2019-11-17 ENCOUNTER — Ambulatory Visit: Payer: Medicare HMO

## 2019-11-17 ENCOUNTER — Other Ambulatory Visit: Payer: Self-pay | Admitting: Internal Medicine

## 2019-11-17 DIAGNOSIS — D582 Other hemoglobinopathies: Secondary | ICD-10-CM | POA: Insufficient documentation

## 2019-11-17 LAB — RPR: RPR Ser Ql: NONREACTIVE

## 2019-11-23 ENCOUNTER — Ambulatory Visit: Payer: Medicare HMO

## 2019-11-23 DIAGNOSIS — M6281 Muscle weakness (generalized): Secondary | ICD-10-CM

## 2019-11-23 DIAGNOSIS — R2689 Other abnormalities of gait and mobility: Secondary | ICD-10-CM

## 2019-11-23 DIAGNOSIS — R278 Other lack of coordination: Secondary | ICD-10-CM

## 2019-11-24 ENCOUNTER — Ambulatory Visit: Payer: Medicare HMO | Admitting: Internal Medicine

## 2019-11-26 ENCOUNTER — Other Ambulatory Visit: Payer: Self-pay | Admitting: Internal Medicine

## 2019-11-26 DIAGNOSIS — Z1231 Encounter for screening mammogram for malignant neoplasm of breast: Secondary | ICD-10-CM

## 2019-11-29 ENCOUNTER — Other Ambulatory Visit: Payer: Self-pay

## 2019-11-29 ENCOUNTER — Encounter: Payer: Self-pay | Admitting: Podiatry

## 2019-11-29 ENCOUNTER — Ambulatory Visit: Payer: Medicare HMO | Admitting: Podiatry

## 2019-11-29 DIAGNOSIS — L600 Ingrowing nail: Secondary | ICD-10-CM

## 2019-11-29 DIAGNOSIS — L603 Nail dystrophy: Secondary | ICD-10-CM | POA: Diagnosis not present

## 2019-11-29 NOTE — Patient Instructions (Signed)

## 2019-11-30 ENCOUNTER — Ambulatory Visit: Payer: Medicare HMO | Admitting: Internal Medicine

## 2019-11-30 ENCOUNTER — Other Ambulatory Visit: Payer: Self-pay

## 2019-11-30 ENCOUNTER — Encounter: Payer: Self-pay | Admitting: Podiatry

## 2019-11-30 ENCOUNTER — Encounter: Payer: Self-pay | Admitting: Internal Medicine

## 2019-11-30 VITALS — BP 98/78 | HR 86 | Temp 98.2°F | Resp 15 | Ht 65.0 in | Wt 129.0 lb

## 2019-11-30 DIAGNOSIS — R7303 Prediabetes: Secondary | ICD-10-CM

## 2019-11-30 DIAGNOSIS — R9431 Abnormal electrocardiogram [ECG] [EKG]: Secondary | ICD-10-CM

## 2019-11-30 DIAGNOSIS — I951 Orthostatic hypotension: Secondary | ICD-10-CM

## 2019-11-30 DIAGNOSIS — D582 Other hemoglobinopathies: Secondary | ICD-10-CM

## 2019-11-30 DIAGNOSIS — I2510 Atherosclerotic heart disease of native coronary artery without angina pectoris: Secondary | ICD-10-CM

## 2019-11-30 DIAGNOSIS — E042 Nontoxic multinodular goiter: Secondary | ICD-10-CM | POA: Diagnosis not present

## 2019-11-30 DIAGNOSIS — I361 Nonrheumatic tricuspid (valve) insufficiency: Secondary | ICD-10-CM

## 2019-11-30 DIAGNOSIS — E0590 Thyrotoxicosis, unspecified without thyrotoxic crisis or storm: Secondary | ICD-10-CM

## 2019-11-30 DIAGNOSIS — I2583 Coronary atherosclerosis due to lipid rich plaque: Secondary | ICD-10-CM

## 2019-11-30 DIAGNOSIS — R42 Dizziness and giddiness: Secondary | ICD-10-CM

## 2019-11-30 DIAGNOSIS — M5432 Sciatica, left side: Secondary | ICD-10-CM

## 2019-11-30 MED ORDER — DOXYCYCLINE HYCLATE 100 MG PO TABS
100.0000 mg | ORAL_TABLET | Freq: Two times a day (BID) | ORAL | 0 refills | Status: DC
Start: 2019-11-30 — End: 2020-04-03

## 2019-11-30 NOTE — Assessment & Plan Note (Addendum)
Pain management  with multilevel ESI in process . Did not have her second appt with Dr Loistine Chance in late July for right sided injections . She denies foot drop but does report tingling sensation in both feet.

## 2019-11-30 NOTE — Assessment & Plan Note (Signed)
By pulse only.  Exam and labs suggestive of mild dehydration . Recommend that she increase her water intake to 60 ounces daily

## 2019-11-30 NOTE — Assessment & Plan Note (Signed)
She has no signs of right heart failure on exam and last ECHO was Oct 2020 (available via Epic portal) showing RA/RV enlargement.

## 2019-11-30 NOTE — Assessment & Plan Note (Addendum)
I have ordered and reviewed a 12 lead EKG and compared it to her previous ones which were received via fax from [facility].  There are chronic changes but  patient is in sinus rhythm.  Episodes of dizziness explained by orthostasis,  But she has a short PR interval, poor R wave progression .  She has known  moderate tricuspid regurgitation )likely due to COPD) and RA/RV enlargement by Oct 2020 ECHO done by [facility] (viewed via Epic portal).  Recommend local cardiology follow up as she is at increased risk for atrial fib.
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
## 2019-11-30 NOTE — Telephone Encounter (Signed)
I re faxed the referral and spoke with pt informing pt referral for PT was re faxed to number provided.

## 2019-11-30 NOTE — Assessment & Plan Note (Signed)
From her last cardiology evaluation in [place]:   Mild nonobstructive coronary disease documented by invasive coronary angiography November 11, 2012 with mild 20% proximal LAD lesion and mild diffuse disease in the lft main and right coronary artery with preserved left ventricular function. Consideration of both a low-dose aspirin and moderate intensity statin may be considered depending on her lipid profile. She remains active denies anginal symptoms.  Local cardiology referral made. She has deferred statin use

## 2019-11-30 NOTE — Patient Instructions (Addendum)
1) I recommend that you have  A LOCAL CARDIOLOGIST to manage any critical issues that may come up   2) Your labs suggest that you are not drinking enough water.  Goal is 60 ounces daily  3) soak your toe for no more than ten minutes twice daily.  Elevate it whenever possible to help the swelling   4) advil 600 mg twice daily AND 1000 MG TYLENOL TWICE DAILY FOR YOUR PAIN

## 2019-11-30 NOTE — Progress Notes (Signed)
Subjective:  Patient ID: Martha Wilson, female    DOB: 27-May-1950,  MRN: [id number]  Chief Complaint  Patient presents with  . Nail Problem    ingrown toenail right hallux medial border x 3 weeks    69 y.o. female presents with the above complaint.  Patient presents with right medial hallux ingrown.  Patient states been hurting for 3 weeks as progressing on worse.  Patient has tried soaking in Epson salt.  Centimeters.  She would like to have removed.  She denies any other acute complaints.  She has had previous history of ingrown as well.   Review of Systems: Negative except as noted in the HPI. Denies N/V/F/Ch.  Past Medical History:  Diagnosis Date  . Allergies   . Arthritis    right knee  . Hypothyroidism   . MVP (mitral valve prolapse)   . Osteopenia   . Palpitations    "stress related"  . Skin cancer   . Tricuspid valve prolapse   . Wears dentures    partial upper and lower    Current Outpatient Medications:  .  doxycycline (VIBRA-TABS) 100 MG tablet, Take 1 tablet (100 mg total) by mouth 2 (two) times daily., Disp: 20 tablet, Rfl: 0 .  methimazole (TAPAZOLE) 5 MG tablet, Take 5 mg by mouth daily., Disp: , Rfl:  .  Multiple Vitamins-Minerals (PRESERVISION AREDS 2) CAPS, Take by mouth., Disp: , Rfl:   Social History   Tobacco Use  Smoking Status Current Some Day Smoker  Smokeless Tobacco Never Used  Tobacco Comment   "occasional" cigarette    Allergies  Allergen Reactions  . Bee Venom Anaphylaxis  . Penicillins     paralysis  . Adhesive [Tape] Rash    Band-aids   Objective:  There were no vitals filed for this visit. There is no height or weight on file to calculate BMI. Constitutional Well developed. Well nourished.  Vascular Dorsalis pedis pulses palpable bilaterally. Posterior tibial pulses palpable bilaterally. Capillary refill normal to all digits.  No cyanosis or clubbing noted. Pedal hair growth normal.  Neurologic Normal  speech. Oriented to person, place, and time. Epicritic sensation to light touch grossly present bilaterally.  Dermatologic Painful ingrowing nail at medial nail borders of the hallux nail right. No other open wounds. No skin lesions.  Orthopedic: Normal joint ROM without pain or crepitus bilaterally. No visible deformities. No bony tenderness.   Radiographs: None Assessment:   1. Ingrown toenail of right foot   2. Nail dystrophy    Plan:  Patient was evaluated and treated and all questions answered.  Ingrown Nail, right with underlying nail dystrophy -Patient elects to proceed with minor surgery to remove ingrown toenail removal today. Consent reviewed and signed by patient. -Ingrown nail excised. See procedure note. -Educated on post-procedure care including soaking. Written instructions provided and reviewed. -Patient to follow up in 2 weeks for nail check.  Procedure: Excision of Ingrown Toenail Location: Right 1st toe medial nail borders. Anesthesia: Lidocaine 1% plain; 1.5 mL and Marcaine 0.5% plain; 1.5 mL, digital block. Skin Prep: Betadine. Dressing: Silvadene; telfa; dry, sterile, compression dressing. Technique: Following skin prep, the toe was exsanguinated and a tourniquet was secured at the base of the toe. The affected nail border was freed, split with a nail splitter, and excised. Chemical matrixectomy was then performed with phenol and irrigated out with alcohol. The tourniquet was then removed and sterile dressing applied. Disposition: Patient tolerated procedure well. Patient to return in 2 weeks for follow-up.  No follow-ups on file.

## 2019-11-30 NOTE — Assessment & Plan Note (Signed)
Suggested by current a1c.  Her fasting glucose has never been  diagnostic of diabetes; but A1c suggests that  She is  at risk for developing type 2 Diabetes.     I recommend  Cutting back on desserts and starches ,  regular participation in a walking program for exercise .

## 2019-11-30 NOTE — Assessment & Plan Note (Signed)
Historically benign by prior biopsy,  In the setting of hyperthyroidisim,  Controlled with methimazole.  Referral to local Endocrinology again placed

## 2019-11-30 NOTE — Assessment & Plan Note (Signed)
Mild, with no prior history , repeat due when hydrated

## 2019-12-07 ENCOUNTER — Other Ambulatory Visit: Payer: Self-pay | Admitting: Neurology

## 2019-12-07 DIAGNOSIS — R269 Unspecified abnormalities of gait and mobility: Secondary | ICD-10-CM

## 2019-12-09 ENCOUNTER — Encounter: Payer: Self-pay | Admitting: Podiatry

## 2019-12-09 ENCOUNTER — Ambulatory Visit: Payer: Medicare HMO | Admitting: Podiatry

## 2019-12-09 ENCOUNTER — Other Ambulatory Visit: Payer: Self-pay

## 2019-12-09 DIAGNOSIS — L03031 Cellulitis of right toe: Secondary | ICD-10-CM

## 2019-12-09 DIAGNOSIS — L600 Ingrowing nail: Secondary | ICD-10-CM

## 2019-12-09 MED ORDER — DOXYCYCLINE HYCLATE 100 MG PO TABS
100.0000 mg | ORAL_TABLET | Freq: Two times a day (BID) | ORAL | 0 refills | Status: DC
Start: 2019-12-09 — End: 2020-04-03

## 2019-12-14 ENCOUNTER — Encounter: Payer: Self-pay | Admitting: Podiatry

## 2019-12-14 NOTE — Progress Notes (Signed)
Subjective:  Patient ID: Martha Wilson, female    DOB: 17-Oct-1950,  MRN: [id number]  Chief Complaint  Patient presents with  . Ingrown Toenail    Patient presents today with concern for infection in right hallux.  She states "it feels like there is still a peice of nail or something in there"    69 y.o. female presents with the above complaint.  Patient presents with a follow-up of right hallux medial nail ingrown procedure that was done by me on 11/29/2019.  Patient states that she feels like there is something still in there.  There is some redness around it.  She denies any other acute complaints she just wants to get evaluated make sure that there is nothing else going on.  She denies any other treatment options.  She has been doing her Epson salt soaks and keeping covered with some Neosporin and a Band-Aid.   Review of Systems: Negative except as noted in the HPI. Denies N/V/F/Ch.  Past Medical History:  Diagnosis Date  . Allergies   . Arthritis    right knee  . Hypothyroidism   . MVP (mitral valve prolapse)   . Osteopenia   . Palpitations    "stress related"  . Skin cancer   . Tricuspid valve prolapse   . Wears dentures    partial upper and lower    Current Outpatient Medications:  .  doxycycline (VIBRA-TABS) 100 MG tablet, Take 1 tablet (100 mg total) by mouth 2 (two) times daily., Disp: 20 tablet, Rfl: 0 .  doxycycline (VIBRA-TABS) 100 MG tablet, Take 1 tablet (100 mg total) by mouth 2 (two) times daily., Disp: 20 tablet, Rfl: 0 .  methimazole (TAPAZOLE) 5 MG tablet, Take 5 mg by mouth daily., Disp: , Rfl:  .  Multiple Vitamins-Minerals (PRESERVISION AREDS 2) CAPS, Take by mouth., Disp: , Rfl:   Social History   Tobacco Use  Smoking Status Current Some Day Smoker  Smokeless Tobacco Never Used  Tobacco Comment   "occasional" cigarette    Allergies  Allergen Reactions  . Bee Venom Anaphylaxis  . Penicillins     paralysis  . Adhesive [Tape] Rash     Band-aids   Objective:  There were no vitals filed for this visit. There is no height or weight on file to calculate BMI. Constitutional Well developed. Well nourished.  Vascular Dorsalis pedis pulses palpable bilaterally. Posterior tibial pulses palpable bilaterally. Capillary refill normal to all digits.  No cyanosis or clubbing noted. Pedal hair growth normal.  Neurologic Normal speech. Oriented to person, place, and time. Epicritic sensation to light touch grossly present bilaterally.  Dermatologic  paronychia noted to the right hallux.  Mild pain on palpation to the medial border of the ingrown.  Upon visualizing I do not see regrowing of the nail. Skin within normal limits  Orthopedic: Normal joint ROM without pain or crepitus bilaterally. No visible deformities. No bony tenderness.   Radiographs: None Assessment:   1. Paronychia of toe of right foot due to ingrown toenail    Plan:  Patient was evaluated and treated and all questions answered.  Paronychia of right hallux -I explained to the patient the etiology of paronychia and various treatment options were discussed.  Given that this was an open wound after removal of the nail may have likely introduce of bacteria leading to paronychia of the hallux.  I explained this to the patient in extensive detail.  I believe patient will benefit from doxycycline course to help with  the erythema.  Patient agrees with the plan. -At this time I do not see a concern for regrowing of the nail as I was not able to climb a clinically palpated.  I discussed this with the patient extensive detail as well.  No follow-ups on file.

## 2019-12-16 ENCOUNTER — Other Ambulatory Visit: Payer: Self-pay

## 2019-12-16 ENCOUNTER — Ambulatory Visit: Payer: Medicare HMO | Admitting: Podiatry

## 2019-12-16 ENCOUNTER — Encounter: Payer: Self-pay | Admitting: Podiatry

## 2019-12-16 DIAGNOSIS — L600 Ingrowing nail: Secondary | ICD-10-CM | POA: Diagnosis not present

## 2019-12-16 DIAGNOSIS — M79674 Pain in right toe(s): Secondary | ICD-10-CM

## 2019-12-16 DIAGNOSIS — L03031 Cellulitis of right toe: Secondary | ICD-10-CM

## 2019-12-16 DIAGNOSIS — B351 Tinea unguium: Secondary | ICD-10-CM | POA: Diagnosis not present

## 2019-12-16 DIAGNOSIS — M79675 Pain in left toe(s): Secondary | ICD-10-CM | POA: Diagnosis not present

## 2019-12-20 ENCOUNTER — Ambulatory Visit
Admission: RE | Admit: 2019-12-20 | Discharge: 2019-12-20 | Disposition: A | Discharge status: Home / Self Care | Payer: Medicare HMO | Source: Ambulatory Visit | Attending: Internal Medicine | Admitting: Internal Medicine

## 2019-12-20 ENCOUNTER — Other Ambulatory Visit: Payer: Self-pay

## 2019-12-20 DIAGNOSIS — Z1231 Encounter for screening mammogram for malignant neoplasm of breast: Secondary | ICD-10-CM | POA: Diagnosis present

## 2019-12-20 IMAGING — MG DIGITAL SCREENING BILAT W/ TOMO W/ CAD
6 of 10 series · 6 of 30 positions shown · non-contrast
Comparison: Previous exam(s).

CLINICAL DATA: Screening.

EXAM:
DIGITAL SCREENING BILATERAL MAMMOGRAM WITH TOMO AND CAD

[L CC synth-2D]
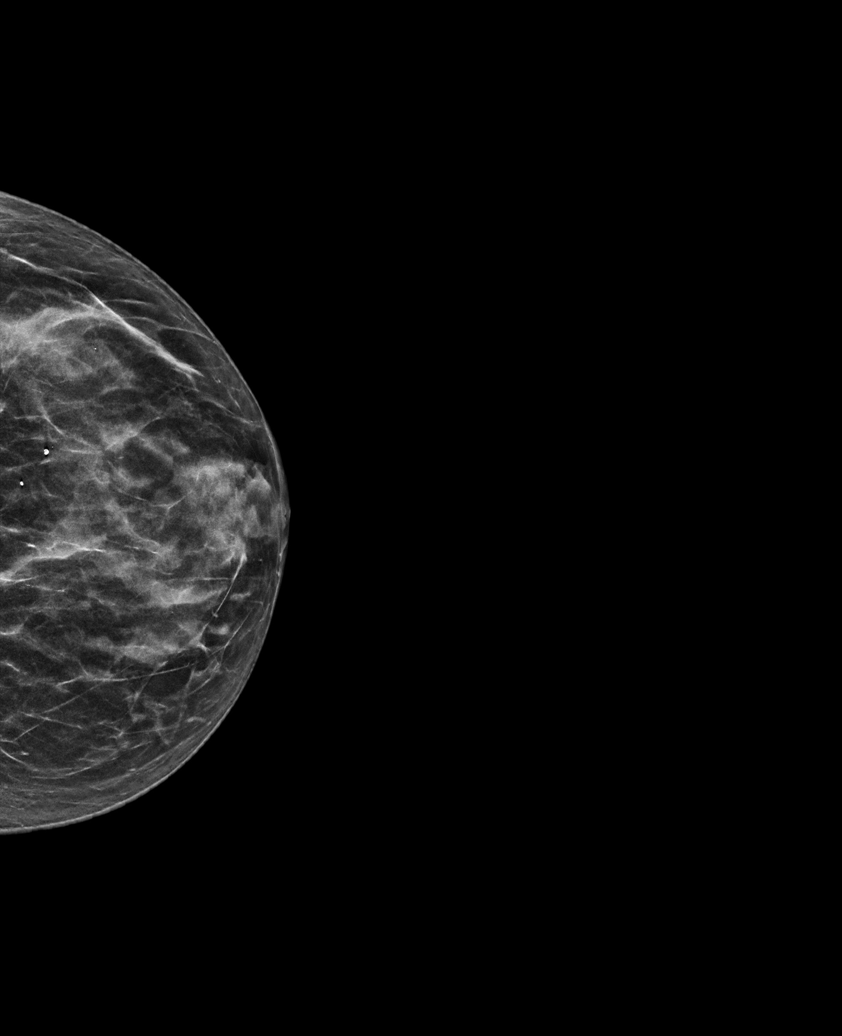

[R MLO synth-2D (1 of 2)]
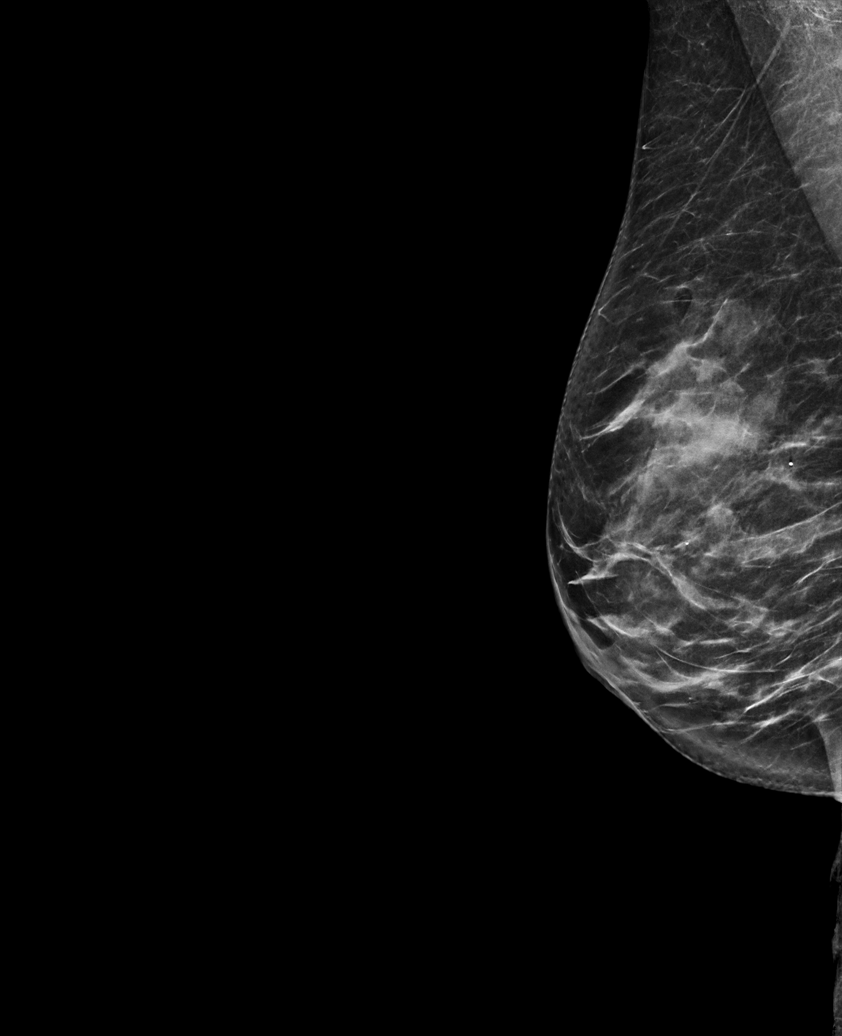

[R CC synth-2D]
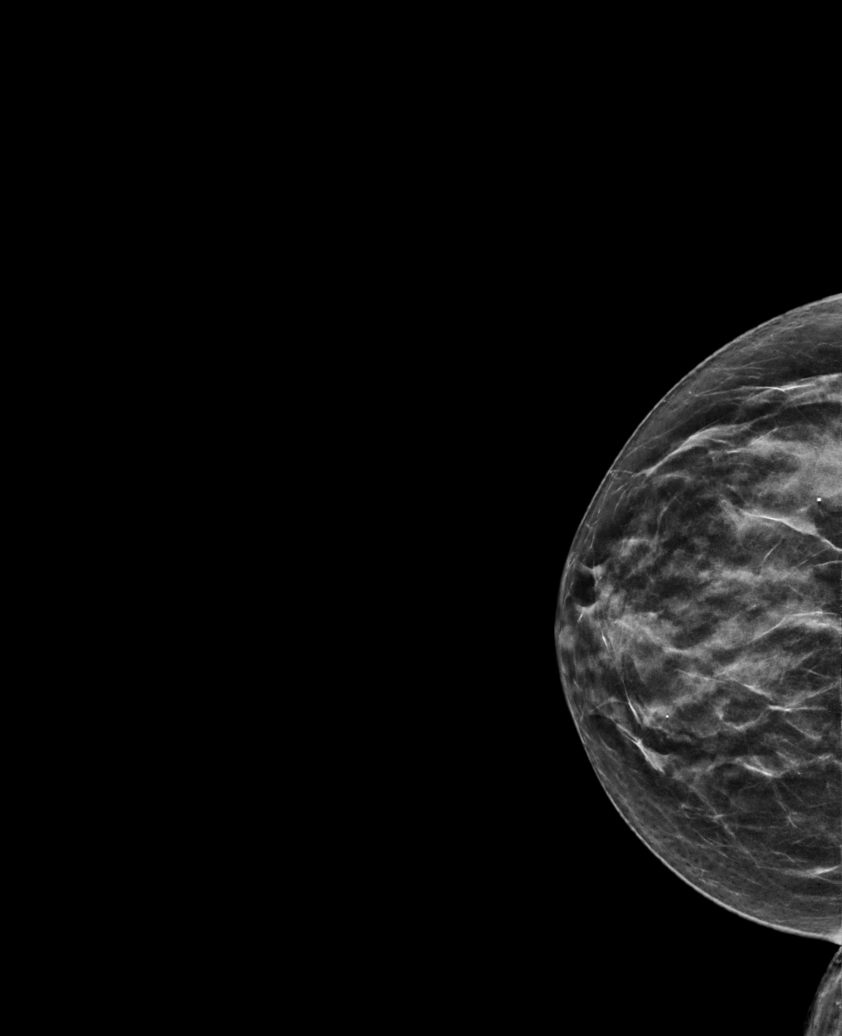

[L MLO synth-2D]
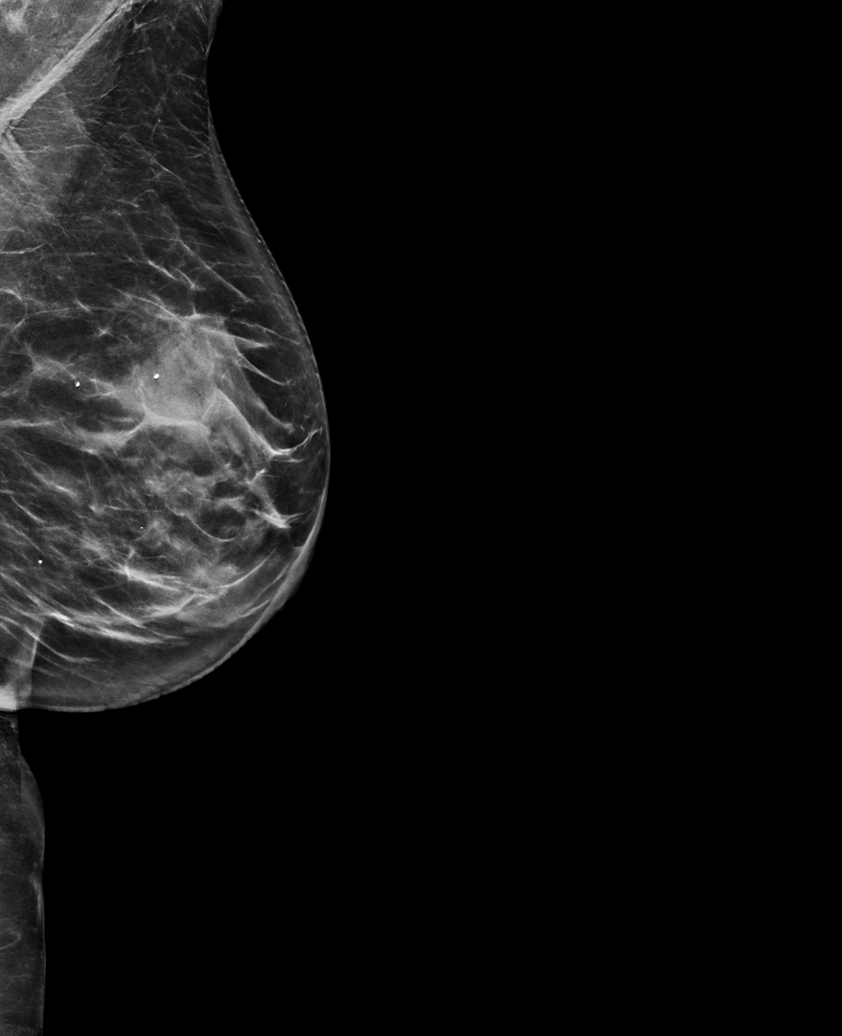

[R MLO synth-2D (2 of 2)]
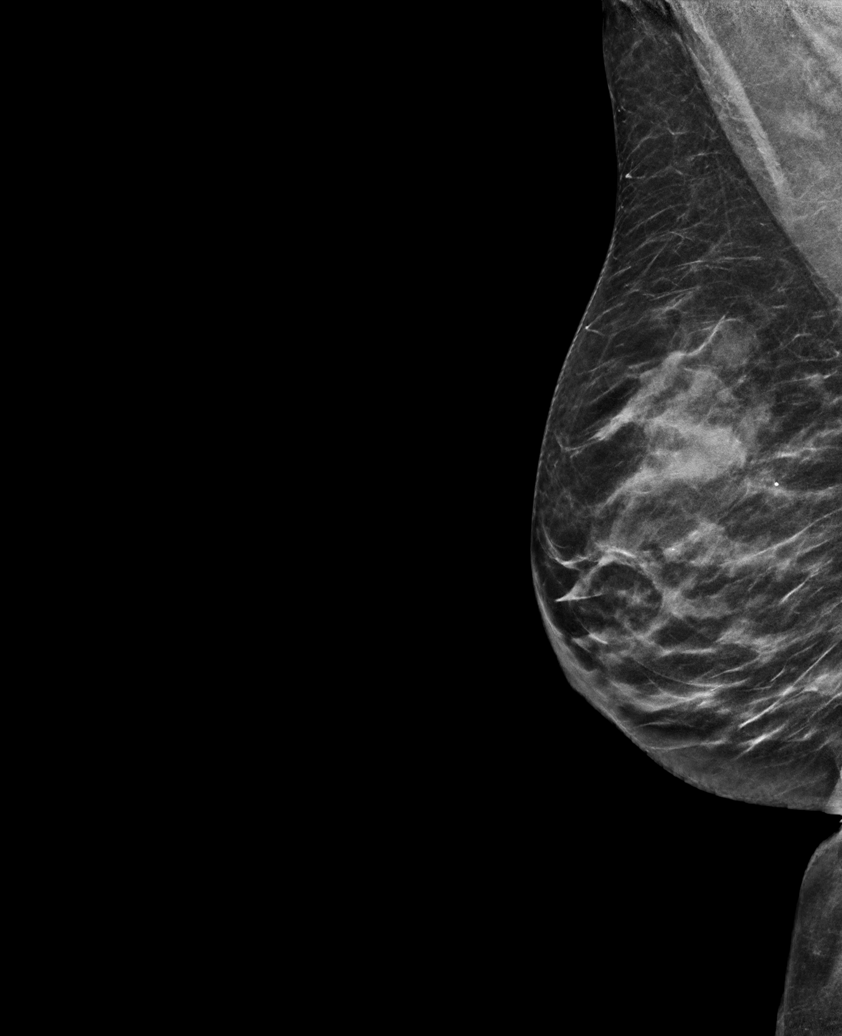

[L MLO tomo · tomo slice 38/75.0]
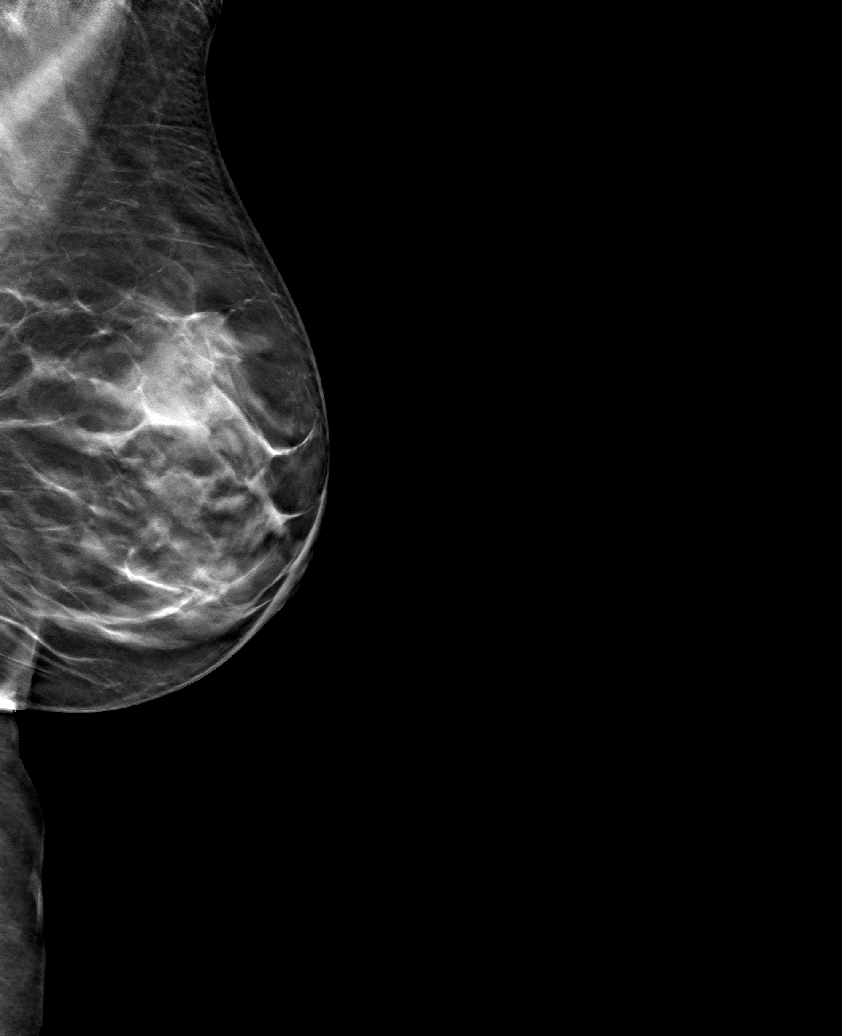

[6 of 30 positions shown; findings below may reference images not displayed]

ACR Breast Density Category c: The breast tissue is heterogeneously
dense, which may obscure small masses.
FINDINGS: There are no findings suspicious for malignancy. Images were
processed with CAD.
IMPRESSION: No mammographic evidence of malignancy. A result letter of this
screening mammogram will be mailed directly to the patient.

RECOMMENDATION:
Screening mammogram in one year. (Code:[5V])

BI-RADS CATEGORY  1: Negative.

## 2019-12-21 ENCOUNTER — Other Ambulatory Visit: Payer: Medicare HMO

## 2019-12-21 ENCOUNTER — Ambulatory Visit (INDEPENDENT_AMBULATORY_CARE_PROVIDER_SITE_OTHER): Payer: Medicare HMO

## 2019-12-21 ENCOUNTER — Encounter: Payer: Self-pay | Admitting: Podiatry

## 2019-12-21 ENCOUNTER — Other Ambulatory Visit (INDEPENDENT_AMBULATORY_CARE_PROVIDER_SITE_OTHER): Payer: Medicare HMO

## 2019-12-21 DIAGNOSIS — Z23 Encounter for immunization: Secondary | ICD-10-CM | POA: Diagnosis not present

## 2019-12-21 DIAGNOSIS — D582 Other hemoglobinopathies: Secondary | ICD-10-CM | POA: Diagnosis not present

## 2019-12-21 LAB — CBC WITH DIFFERENTIAL/PLATELET
Basophils Absolute: 0.1 10*3/uL (ref 0.0–0.1)
Basophils Relative: 0.9 % (ref 0.0–3.0)
Eosinophils Absolute: 0.1 10*3/uL (ref 0.0–0.7)
Eosinophils Relative: 0.7 % (ref 0.0–5.0)
HCT: 43 % (ref 36.0–46.0)
Hemoglobin: 14.7 g/dL (ref 12.0–15.0)
Lymphocytes Relative: 30.8 % (ref 12.0–46.0)
Lymphs Abs: 2.4 10*3/uL (ref 0.7–4.0)
MCHC: 34.2 g/dL (ref 30.0–36.0)
MCV: 102.5 fl — ABNORMAL HIGH (ref 78.0–100.0)
Monocytes Absolute: 0.8 10*3/uL (ref 0.1–1.0)
Monocytes Relative: 9.7 % (ref 3.0–12.0)
Neutro Abs: 4.6 10*3/uL (ref 1.4–7.7)
Neutrophils Relative %: 57.9 % (ref 43.0–77.0)
Platelets: 291 10*3/uL (ref 150.0–400.0)
RBC: 4.19 Mil/uL (ref 3.87–5.11)
RDW: 13.5 % (ref 11.5–15.5)
WBC: 7.9 10*3/uL (ref 4.0–10.5)

## 2019-12-21 NOTE — Progress Notes (Signed)
Subjective:  Patient ID: Martha Wilson, female    DOB: 02-01-1951,  MRN: [id number]  Chief Complaint  Patient presents with  . Ingrown Toenail    "its doing better, but I woke up and it was sore this morning"    69 y.o. female presents with the above complaint.  Patient presents with a follow-up of right hallux medial ingrown nail was done by me which left a paronychia.  Patient states that she is doing well has completely healed especially with antibiotics.  She denies any other acute complaints to the right side.  She has secondary complaint of thickened elongated dystrophic toenails x10.  Patient would like to have it debrided down as she is not able to do it herself.  She denies any other acute complaints.   Review of Systems: Negative except as noted in the HPI. Denies N/V/F/Ch.  Past Medical History:  Diagnosis Date  . Allergies   . Arthritis    right knee  . Hypothyroidism   . MVP (mitral valve prolapse)   . Osteopenia   . Palpitations    "stress related"  . Skin cancer   . Tricuspid valve prolapse   . Wears dentures    partial upper and lower    Current Outpatient Medications:  .  doxycycline (VIBRA-TABS) 100 MG tablet, Take 1 tablet (100 mg total) by mouth 2 (two) times daily., Disp: 20 tablet, Rfl: 0 .  doxycycline (VIBRA-TABS) 100 MG tablet, Take 1 tablet (100 mg total) by mouth 2 (two) times daily., Disp: 20 tablet, Rfl: 0 .  methimazole (TAPAZOLE) 5 MG tablet, Take 5 mg by mouth daily., Disp: , Rfl:  .  Multiple Vitamins-Minerals (PRESERVISION AREDS 2) CAPS, Take by mouth., Disp: , Rfl:   Social History   Tobacco Use  Smoking Status Current Some Day Smoker  Smokeless Tobacco Never Used  Tobacco Comment   "occasional" cigarette    Allergies  Allergen Reactions  . Bee Venom Anaphylaxis  . Penicillins     paralysis  . Adhesive [Tape] Rash    Band-aids   Objective:  There were no vitals filed for this visit. There is no height or weight on file  to calculate BMI. Constitutional Well developed. Well nourished.  Vascular Dorsalis pedis pulses palpable bilaterally. Posterior tibial pulses palpable bilaterally. Capillary refill normal to all digits.  No cyanosis or clubbing noted. Pedal hair growth normal.  Neurologic Normal speech. Oriented to person, place, and time. Epicritic sensation to light touch grossly present bilaterally.  Dermatologic  resolving paronychia noted to the right hallux.  No pain on palpation to the medial border of the ingrown.  Upon visualizing I do not see regrowing of the nail. Nail examination shows thickened elongated dystrophic mycotic nails x10.  Pain on palpation Skin within normal limits  Orthopedic: Normal joint ROM without pain or crepitus bilaterally. No visible deformities. No bony tenderness.   Radiographs: None Assessment:   1. Paronychia of toe of right foot due to ingrown toenail   2. Pain due to onychomycosis of toenails of both feet    Plan:  Patient was evaluated and treated and all questions answered.  Paronychia of right hallux -I discussed with her patient the etiology of paronychia that may have contributed to the redness.  Given that patient has completely healed from doxycycline course at this time I am not concerned for regrowing of the nail as she does not have any pain.  I discussed with her if she does have pain in  the future to come back and see me.  Patient states understanding.  Onychomycosis with pain  -Nails palliatively debrided as below. -Educated on self-care  Procedure: Nail Debridement Rationale: pain  Type of Debridement: manual, sharp debridement. Instrumentation: Nail nipper, rotary burr. Number of Nails: 10  Procedures and Treatment: Consent by patient was obtained for treatment procedures. The patient understood the discussion of treatment and procedures well. All questions were answered thoroughly reviewed. Debridement of mycotic and hypertrophic  toenails, 1 through 5 bilateral and clearing of subungual debris. No ulceration, no infection noted.  Return Visit-Office Procedure: Patient instructed to return to the office for a follow up visit 3 months for continued evaluation and treatment.  Boneta Lucks, DPM    No follow-ups on file.   No follow-ups on file.

## 2019-12-22 ENCOUNTER — Other Ambulatory Visit: Payer: Medicare HMO

## 2019-12-22 ENCOUNTER — Ambulatory Visit: Payer: Medicare HMO

## 2019-12-27 ENCOUNTER — Ambulatory Visit
Admission: RE | Admit: 2019-12-27 | Discharge: 2019-12-27 | Disposition: A | Discharge status: Home / Self Care | Payer: Medicare HMO | Source: Ambulatory Visit | Attending: Neurology | Admitting: Neurology

## 2019-12-27 ENCOUNTER — Other Ambulatory Visit: Payer: Self-pay

## 2019-12-27 DIAGNOSIS — R269 Unspecified abnormalities of gait and mobility: Secondary | ICD-10-CM | POA: Insufficient documentation

## 2019-12-27 IMAGING — MR MR HEAD W/O CM
12 series · 47 of 48 positions shown · non-contrast
Comparison: None.

CLINICAL DATA: Unsteady gait

EXAM:
MRI HEAD WITHOUT CONTRAST
MRI CERVICAL SPINE WITHOUT CONTRAST
TECHNIQUE: Multiplanar, multiecho pulse sequences of the brain and surrounding
structures, and cervical spine, to include the craniocervical
junction and cervicothoracic junction, were obtained without
intravenous contrast.

[Series 5: ax dwi_tracew · axial · 3.0mm · 0.60mm/px · z∈[-136,+19]mm · 3 of 48 slices shown]
[im 1/48]
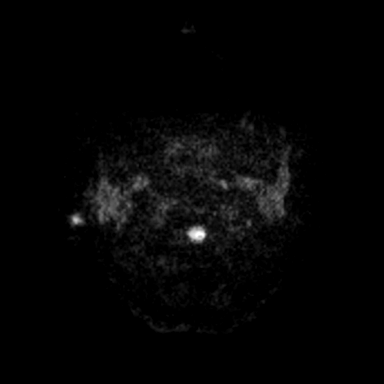
[im 24/48]
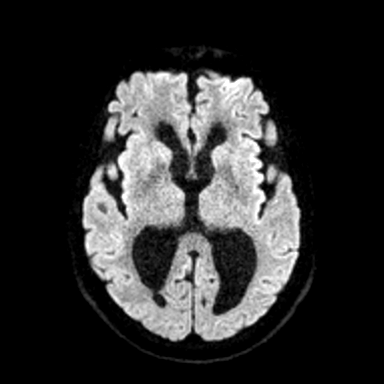
[im 48/48]
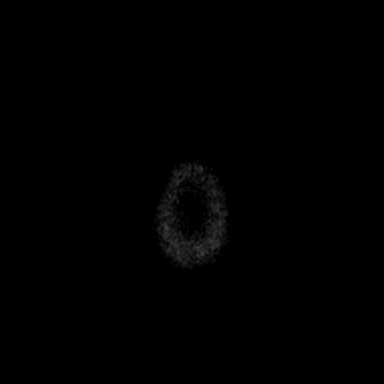

[Series 6: ax dwi_adc · axial · 3.0mm · 0.60mm/px · z∈[-136,+19]mm · 4 of 48 slices shown]
[im 1/48]
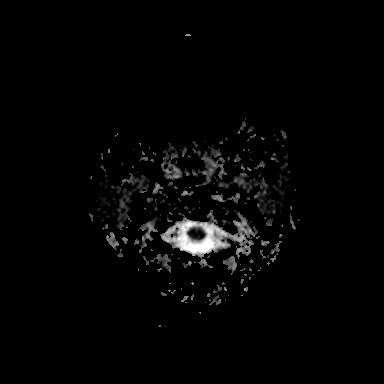
[im 16/48]
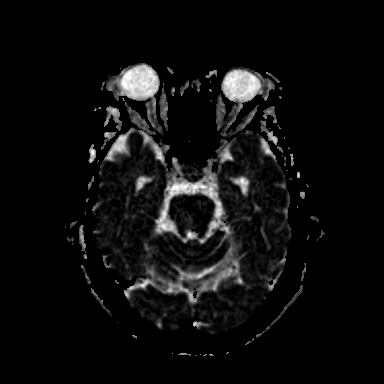
[im 32/48]
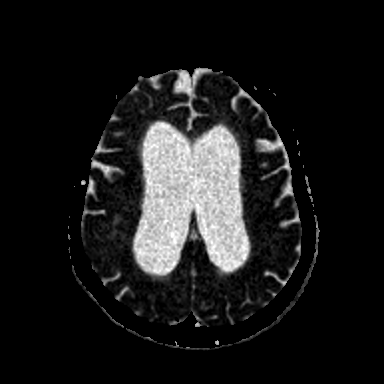
[im 48/48]
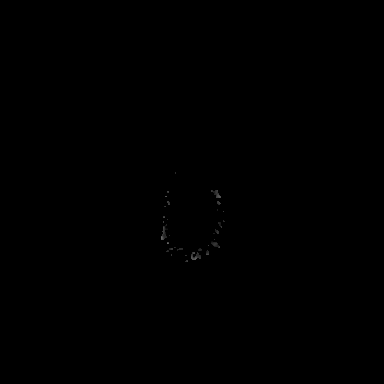

[Series 7: T1 · sagittal · 5.0mm · 0.62mm/px · 2 of 21 slices shown (1 of 2)]
[im 1/21]
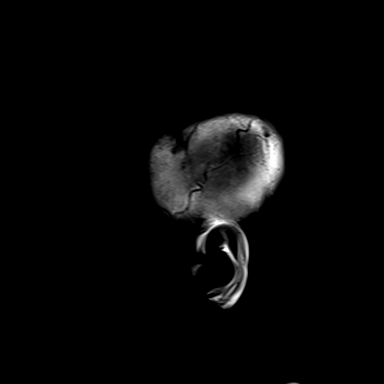
[im 21/21]
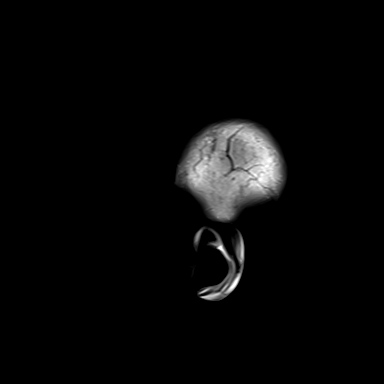

[Series 8: T2 · axial · 5.0mm · 0.53mm/px · z∈[-140,+16]mm · 2 of 27 slices shown (1 of 2)]
[im 1/27]
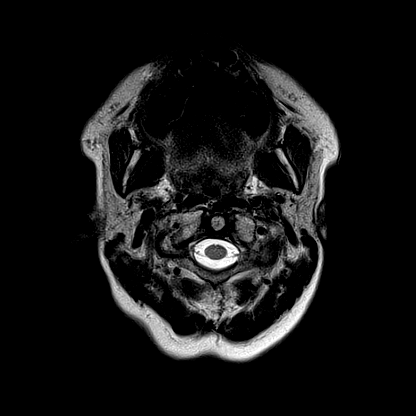
[im 27/27]
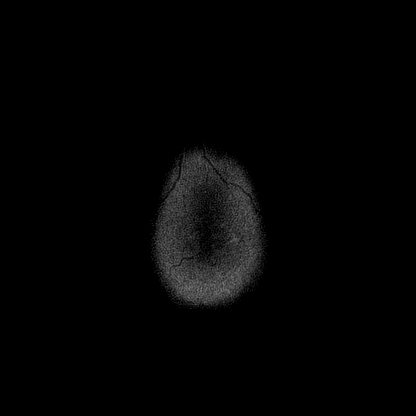

[Series 9: cor dwi_tracew · coronal · 5.0mm · 0.68mm/px · 3 of 38 slices shown]
[im 1/38]
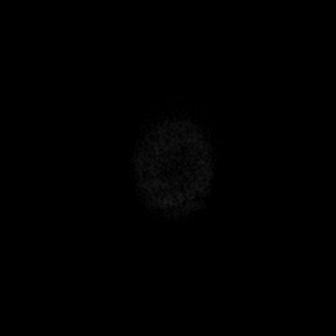
[im 19/38]
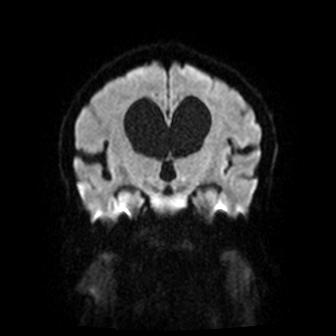
[im 38/38]
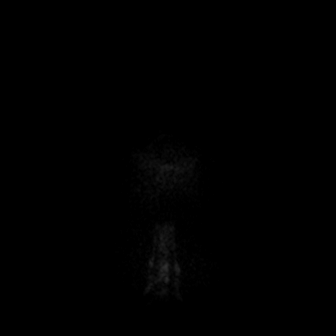

[Series 10: cor dwi_adc · coronal · 5.0mm · 0.68mm/px · 3 of 38 slices shown]
[im 1/38]
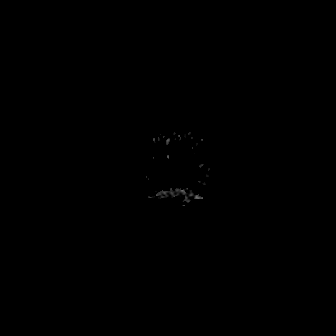
[im 19/38]
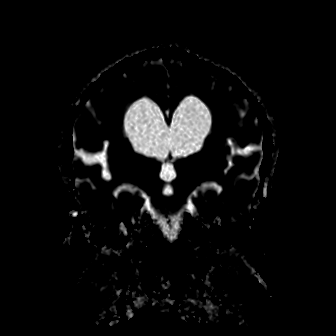
[im 38/38]
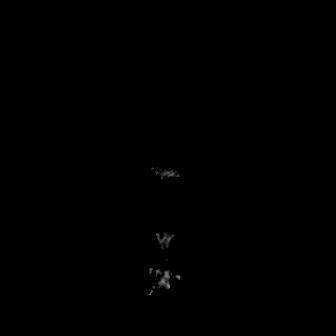

[Series 11: mag_images · axial · 3.0mm · 0.90mm/px · z∈[-149,+28]mm · 4 of 60 slices shown]
[im 1/60]
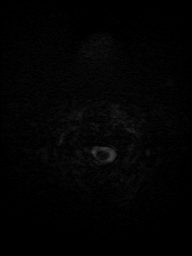
[im 20/60]
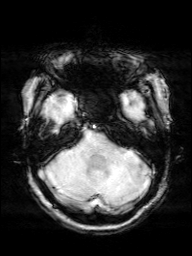
[im 40/60]
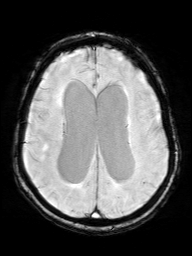
[im 60/60]
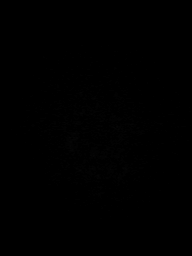

[Series 12: pha_images · axial · 3.0mm · 0.90mm/px · z∈[-149,+28]mm · 4 of 59 slices shown]
[im 1/59]
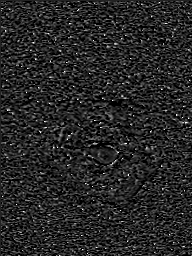
[im 20/59]
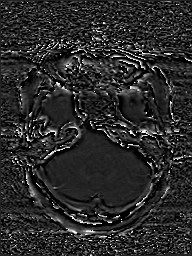
[im 39/59]
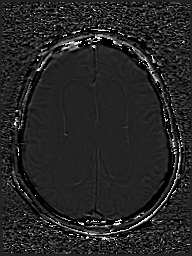
[im 59/59]
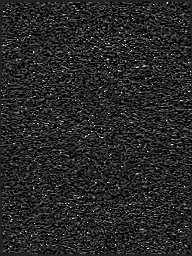

[Series 13: swi_images · axial · 3.0mm · 0.90mm/px · z∈[-149,+28]mm · 4 of 60 slices shown]
[im 1/60]
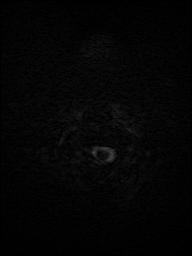
[im 20/60]
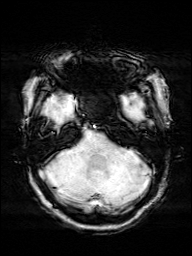
[im 40/60]
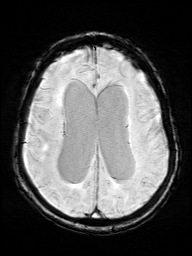
[im 60/60]
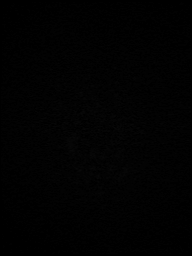

[Series 15: FLAIR · axial · 3.0mm · 0.53mm/px · z∈[-143,+19]mm · 4 of 55 slices shown]
[im 1/55]
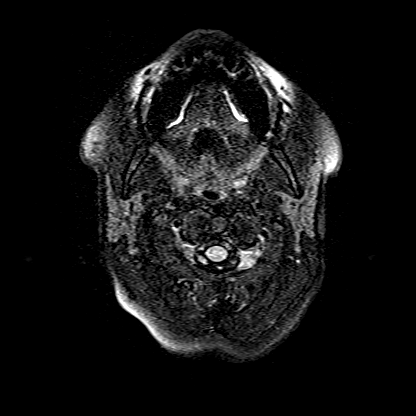
[im 19/55]
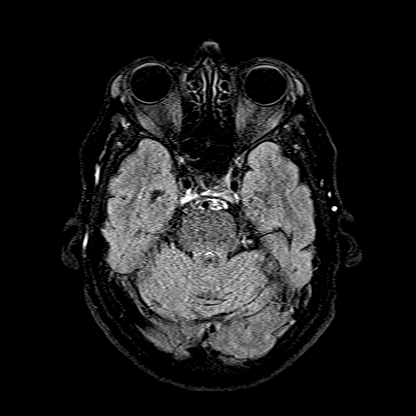
[im 37/55]
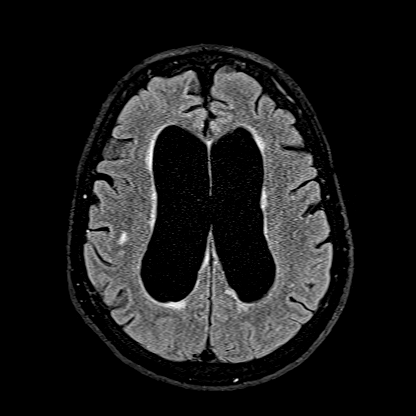
[im 55/55]
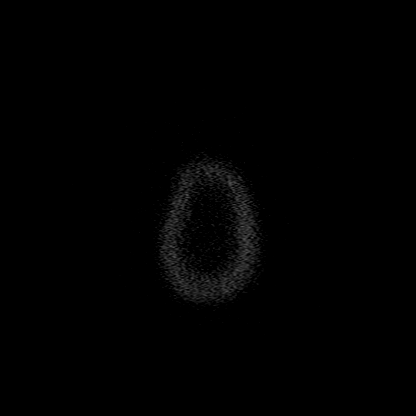

[Series 16: T1 · axial · 1.0mm · 0.98mm/px · z∈[-134,+40]mm · 12 of 175 slices shown (2 of 2)]
[im 1/175]
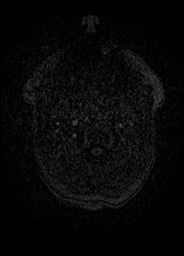
[im 15/175]
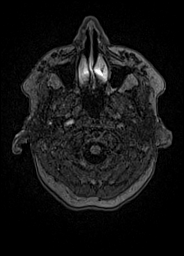
[im 30/175]
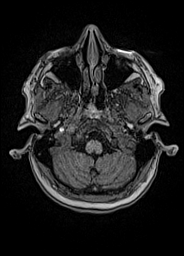
[im 44/175]
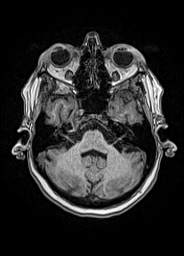
[im 59/175]
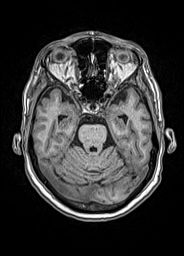
[im 73/175]
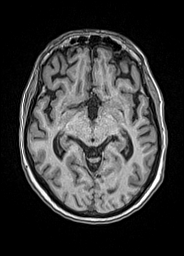
[im 88/175]
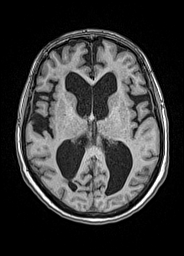
[im 102/175]
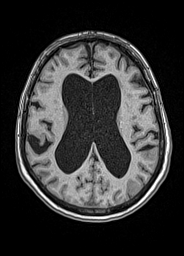
[im 117/175]
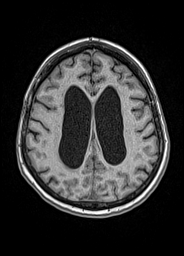
[im 131/175]
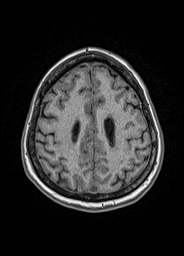
[im 146/175]
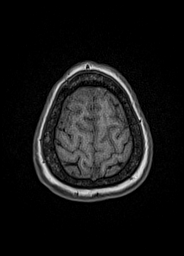
[im 175/175]
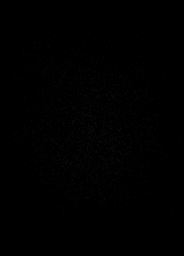

[Series 17: T2 · coronal · 5.0mm · 0.57mm/px · 2 of 29 slices shown (2 of 2)]
[im 1/29]
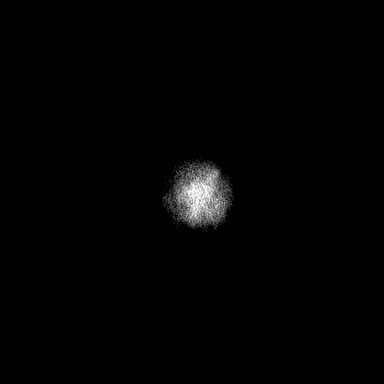
[im 29/29]
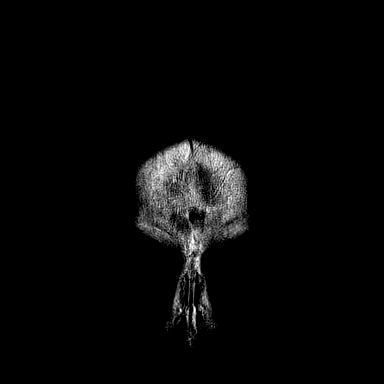

[47 of 48 positions shown; findings below may reference images not displayed]

FINDINGS: MRI HEAD FINDINGS

Brain: Moderate cerebral atrophy with ex vacuo dilatation. Mild
chronic microvascular ischemic changes. No midline shift, mass
lesion or extra-axial fluid collection. Lateral and third
ventricular dilatation is out of proportion to cerebral volume loss.
No intracranial hemorrhage. No diffusion-weighted signal
abnormality.

Vascular: Preserved major intracranial flow voids.

Skull and upper cervical spine: Normal marrow signal.

Sinuses/Orbits: Normal orbits. Sequela of bilateral lens
replacement. Clear paranasal sinuses. No mastoid effusion.

Other: None.

MRI CERVICAL SPINE FINDINGS

Alignment: Straightening of lordosis. Minimal grade 1 C3-4 and C7-T1
anterolisthesis. Minimal grade 1 C4-5 retrolisthesis.

Vertebrae: No focal osseous lesion. Modic type 1 endplate
degenerative changes at the C4-5 level.

Cord: Normal signal and morphology.

Posterior Fossa, vertebral arteries: Please see MRI head.

Disc levels: Multilevel desiccation, osteophytosis and disc space
loss most prominent at the C4-6 levels.

C2-3: Bilateral facet hypertrophy. Patent spinal canal and neural
foramen.

C3-4: Disc osteophyte complex with small central protrusion,
uncovertebral and facet degenerative spurring. Mild spinal canal and
bilateral neural foraminal narrowing.

C4-5: Disc osteophyte complex with uncovertebral and facet
degenerative spurring. Mild to moderate spinal canal, moderate right
and mild left neural foraminal narrowing.

C5-6: Disc osteophyte complex with uncovertebral and bilateral facet
hypertrophy. Mild to moderate spinal canal, mild right and moderate
left neural foraminal narrowing.

C6-7: Left predominant uncovertebral and facet hypertrophy. Mild
left neural foraminal narrowing. Patent spinal canal and right
neural foramen.

C7-T1: No significant disc bulge, spinal canal or neural foraminal
narrowing.

Paraspinal tissues: Negative.
IMPRESSION: MRI HEAD:

No acute intracranial process. Diffuse cerebral atrophy and chronic
microvascular ischemic changes.

Lateral and third ventricular dilatation out of proportion to
cerebral volume loss and suspicious for normal pressure
hydrocephalus. Consider dedicated CSF flow study for further
evaluation.

MRI CERVICAL SPINE:

Mild to moderate spinal canal and bilateral neural foraminal
narrowing at the C4-5 and C5-6 levels.

Mild spinal canal and bilateral neural foraminal narrowing at C3-4.

Mild left C6-7 neural foraminal narrowing.

These results will be called to the ordering clinician or
representative by the Radiologist Assistant, and communication
documented in the PACS or [REDACTED].

## 2019-12-27 IMAGING — MR MR CERVICAL SPINE W/O CM
5 series · 37 of 48 positions shown · non-contrast
Comparison: None.

CLINICAL DATA: Unsteady gait

EXAM:
MRI HEAD WITHOUT CONTRAST
MRI CERVICAL SPINE WITHOUT CONTRAST
TECHNIQUE: Multiplanar, multiecho pulse sequences of the brain and surrounding
structures, and cervical spine, to include the craniocervical
junction and cervicothoracic junction, were obtained without
intravenous contrast.

[Series 5: T2 · sagittal · 3.0mm · 0.62mm/px · 8 of 15 slices shown (1 of 2)]
[im 1/15]
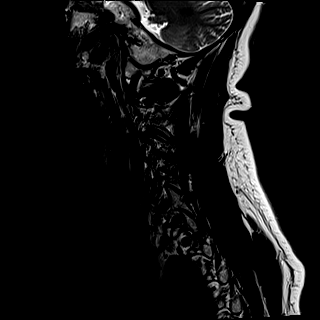
[im 3/15]
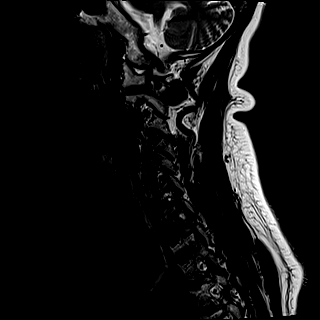
[im 5/15]
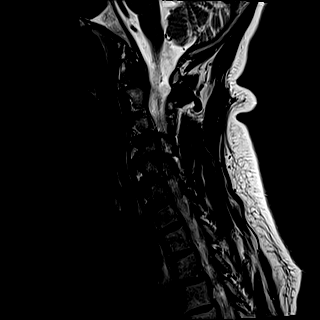
[im 7/15]
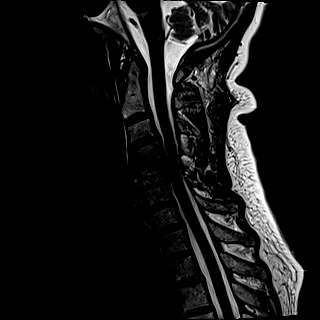
[im 9/15]
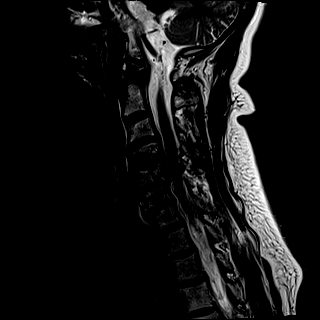
[im 11/15]
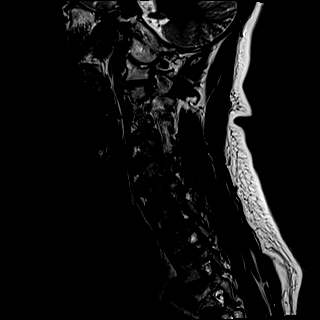
[im 13/15]
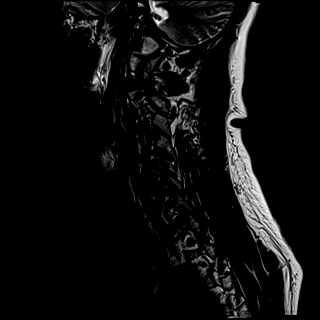
[im 15/15]
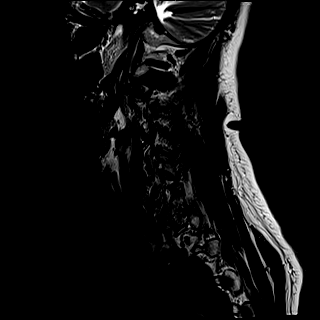

[Series 6: FLAIR · sagittal · 3.0mm · 0.78mm/px · 7 of 15 slices shown]
[im 1/15]
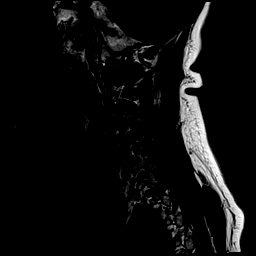
[im 3/15]
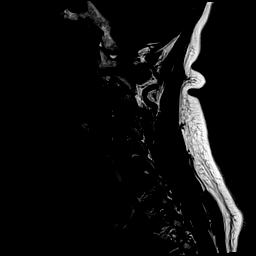
[im 5/15]
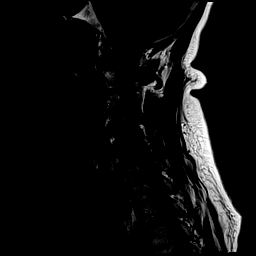
[im 8/15]
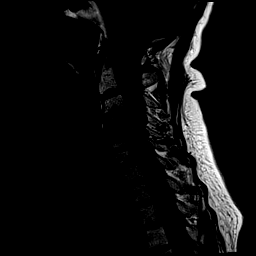
[im 10/15]
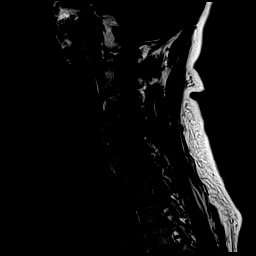
[im 12/15]
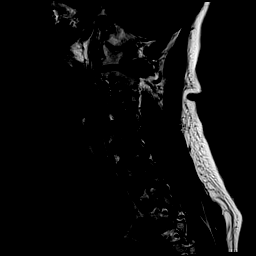
[im 15/15]
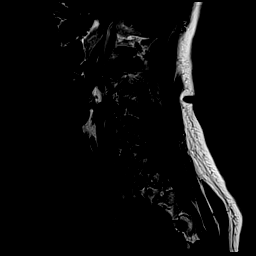

[Series 7: STIR · sagittal · 3.0mm · 0.62mm/px · 7 of 15 slices shown]
[im 1/15]
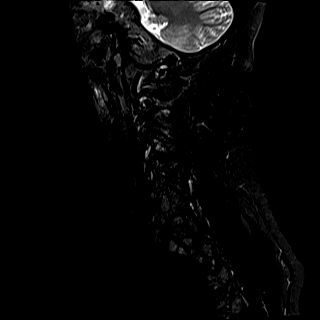
[im 3/15]
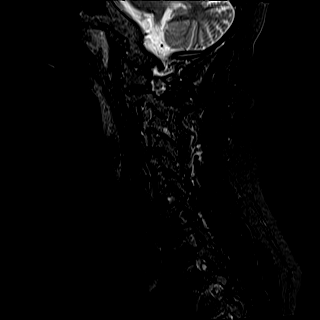
[im 5/15]
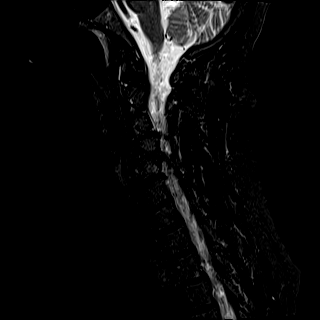
[im 8/15]
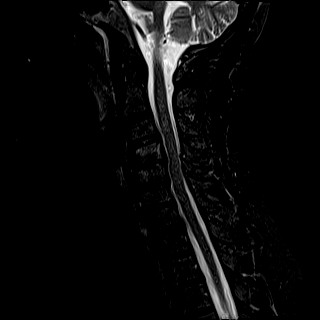
[im 10/15]
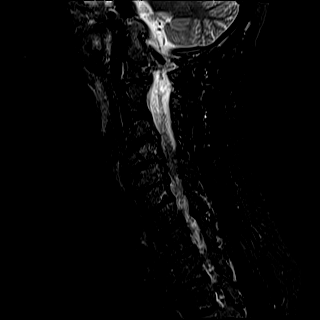
[im 12/15]
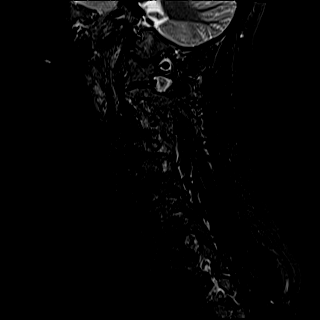
[im 15/15]
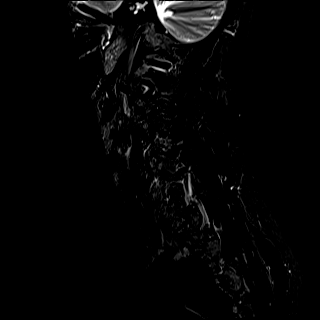

[Series 8: T2 · axial · 3.0mm · 0.70mm/px · z∈[-256,-167]mm · 9 of 27 slices shown (2 of 2)]
[im 1/27]
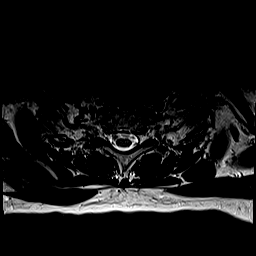
[im 5/27]
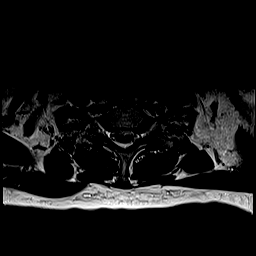
[im 9/27]
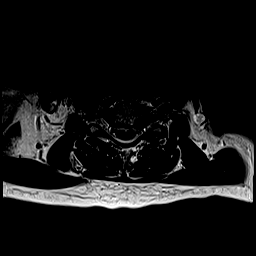
[im 11/27]
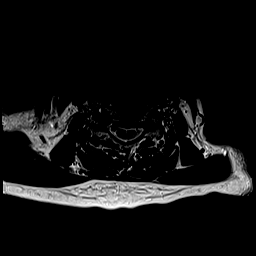
[im 14/27]
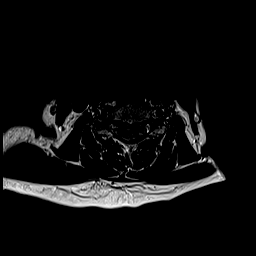
[im 16/27]
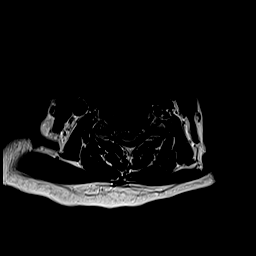
[im 18/27]
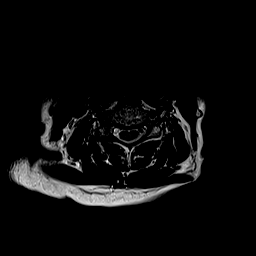
[im 22/27]
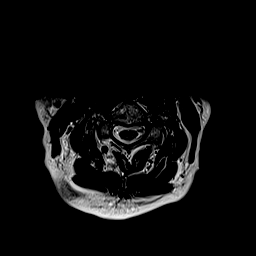
[im 27/27]
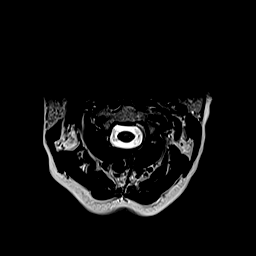

[Series 9: ax mpgr · axial · 3.0mm · 0.35mm/px · z∈[-256,-198]mm · 6 of 27 slices shown]
[im 1/27]
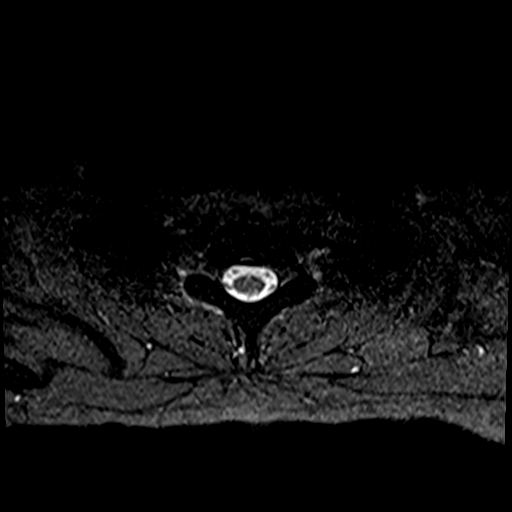
[im 5/27]
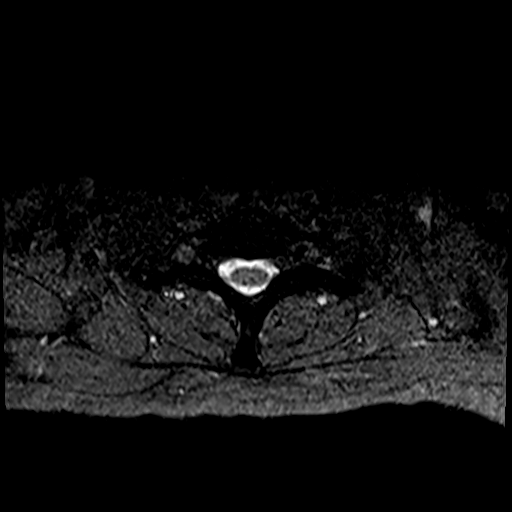
[im 9/27]
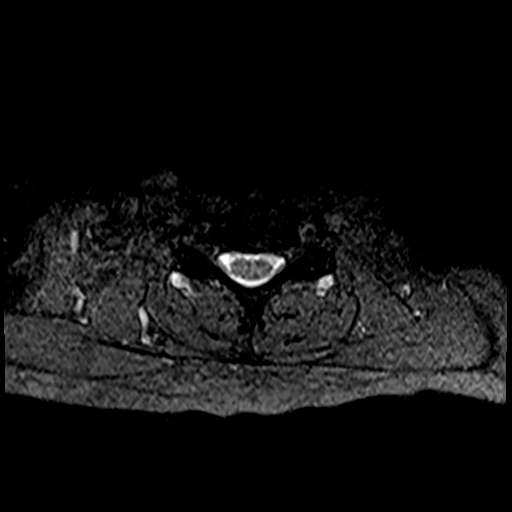
[im 11/27]
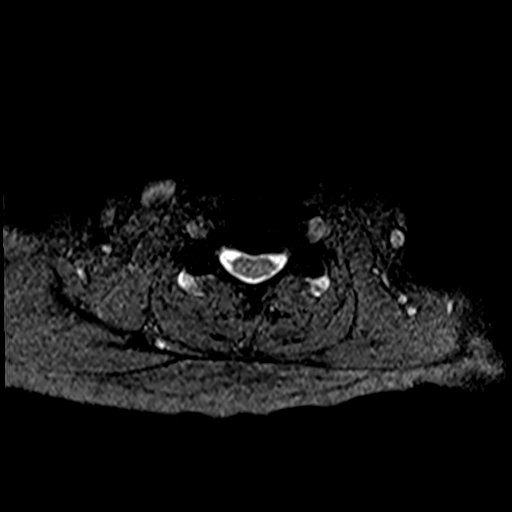
[im 16/27]
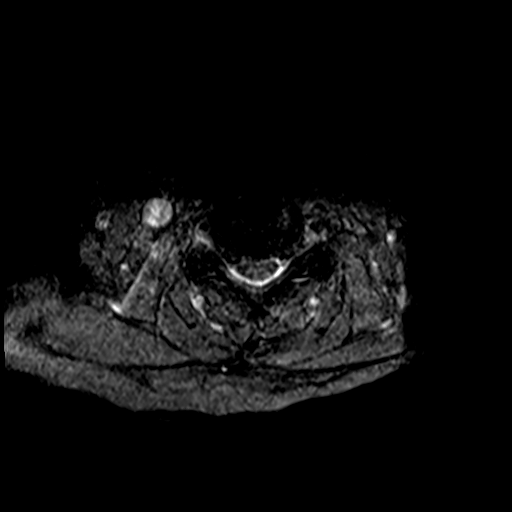
[im 18/27]
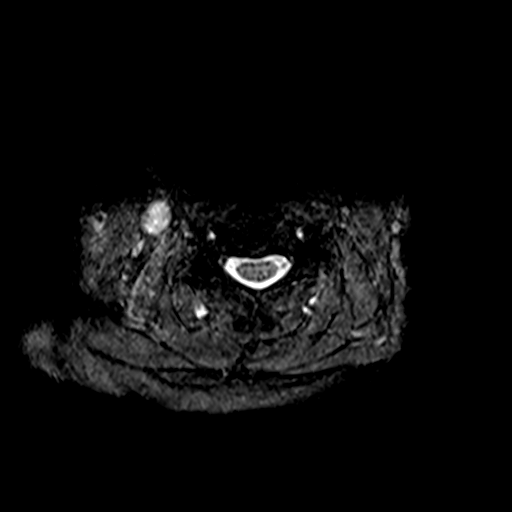

[37 of 48 positions shown; findings below may reference images not displayed]

FINDINGS: MRI HEAD FINDINGS

Brain: Moderate cerebral atrophy with ex vacuo dilatation. Mild
chronic microvascular ischemic changes. No midline shift, mass
lesion or extra-axial fluid collection. Lateral and third
ventricular dilatation is out of proportion to cerebral volume loss.
No intracranial hemorrhage. No diffusion-weighted signal
abnormality.

Vascular: Preserved major intracranial flow voids.

Skull and upper cervical spine: Normal marrow signal.

Sinuses/Orbits: Normal orbits. Sequela of bilateral lens
replacement. Clear paranasal sinuses. No mastoid effusion.

Other: None.

MRI CERVICAL SPINE FINDINGS

Alignment: Straightening of lordosis. Minimal grade 1 C3-4 and C7-T1
anterolisthesis. Minimal grade 1 C4-5 retrolisthesis.

Vertebrae: No focal osseous lesion. Modic type 1 endplate
degenerative changes at the C4-5 level.

Cord: Normal signal and morphology.

Posterior Fossa, vertebral arteries: Please see MRI head.

Disc levels: Multilevel desiccation, osteophytosis and disc space
loss most prominent at the C4-6 levels.

C2-3: Bilateral facet hypertrophy. Patent spinal canal and neural
foramen.

C3-4: Disc osteophyte complex with small central protrusion,
uncovertebral and facet degenerative spurring. Mild spinal canal and
bilateral neural foraminal narrowing.

C4-5: Disc osteophyte complex with uncovertebral and facet
degenerative spurring. Mild to moderate spinal canal, moderate right
and mild left neural foraminal narrowing.

C5-6: Disc osteophyte complex with uncovertebral and bilateral facet
hypertrophy. Mild to moderate spinal canal, mild right and moderate
left neural foraminal narrowing.

C6-7: Left predominant uncovertebral and facet hypertrophy. Mild
left neural foraminal narrowing. Patent spinal canal and right
neural foramen.

C7-T1: No significant disc bulge, spinal canal or neural foraminal
narrowing.

Paraspinal tissues: Negative.
IMPRESSION: MRI HEAD:

No acute intracranial process. Diffuse cerebral atrophy and chronic
microvascular ischemic changes.

Lateral and third ventricular dilatation out of proportion to
cerebral volume loss and suspicious for normal pressure
hydrocephalus. Consider dedicated CSF flow study for further
evaluation.

MRI CERVICAL SPINE:

Mild to moderate spinal canal and bilateral neural foraminal
narrowing at the C4-5 and C5-6 levels.

Mild spinal canal and bilateral neural foraminal narrowing at C3-4.

Mild left C6-7 neural foraminal narrowing.

These results will be called to the ordering clinician or
representative by the Radiologist Assistant, and communication
documented in the PACS or [REDACTED].

## 2020-01-03 ENCOUNTER — Ambulatory Visit: Payer: Medicare HMO | Admitting: Cardiology

## 2020-01-17 DIAGNOSIS — G9389 Other specified disorders of brain: Secondary | ICD-10-CM | POA: Insufficient documentation

## 2020-01-19 ENCOUNTER — Telehealth: Payer: Self-pay | Admitting: Internal Medicine

## 2020-01-19 NOTE — Telephone Encounter (Signed)
PT WILL CAL BACK. Due to schedule Medicare Annual Wellness Visit (AWV)   This should be a telephone visit only=30 minutes.  Last AWV 11/16/18; please schedule at anytime with Denisa O'Brien-Blaney at [facility].

## 2020-02-09 ENCOUNTER — Telehealth: Payer: Self-pay | Admitting: Internal Medicine

## 2020-02-09 DIAGNOSIS — R2689 Other abnormalities of gait and mobility: Secondary | ICD-10-CM

## 2020-02-09 NOTE — Telephone Encounter (Signed)
  Your referral is in process as requested.  Our referral coordinator will call you when the appointment has been made.  If you do not hear from our office in a week,   Please call us back.  Regards,   Kenlee Maler, MD     

## 2020-02-09 NOTE — Telephone Encounter (Signed)
Patient called in wanting Dr.Tullo to write prescription for physical therapy for walking gait and balance the name of place is [facility] fax number is [phone]

## 2020-02-10 NOTE — Telephone Encounter (Signed)
Spoke with pt to let her know that the referral has been placed. Pt gave a verbal understanding.

## 2020-02-25 DIAGNOSIS — G912 (Idiopathic) normal pressure hydrocephalus: Secondary | ICD-10-CM | POA: Insufficient documentation

## 2020-04-03 ENCOUNTER — Ambulatory Visit: Payer: Medicare HMO | Admitting: Cardiology

## 2020-04-03 ENCOUNTER — Other Ambulatory Visit: Payer: Self-pay

## 2020-04-03 ENCOUNTER — Encounter: Payer: Self-pay | Admitting: Cardiology

## 2020-04-03 VITALS — BP 100/72 | HR 83 | Ht 65.5 in | Wt 133.0 lb

## 2020-04-03 DIAGNOSIS — Z01818 Encounter for other preprocedural examination: Secondary | ICD-10-CM | POA: Diagnosis not present

## 2020-04-03 DIAGNOSIS — I361 Nonrheumatic tricuspid (valve) insufficiency: Secondary | ICD-10-CM | POA: Diagnosis not present

## 2020-04-03 DIAGNOSIS — I2510 Atherosclerotic heart disease of native coronary artery without angina pectoris: Secondary | ICD-10-CM | POA: Diagnosis not present

## 2020-04-03 MED ORDER — ASPIRIN EC 81 MG PO TBEC: 81.0000 mg | DELAYED_RELEASE_TABLET | Freq: Every day | ORAL | 5 refills | Status: AC

## 2020-04-03 MED ORDER — ATORVASTATIN CALCIUM 20 MG PO TABS: 20.0000 mg | ORAL_TABLET | Freq: Every day | ORAL | 5 refills | Status: DC

## 2020-04-03 NOTE — Patient Instructions (Signed)
Medication Instructions:  Your physician has recommended you make the following change in your medication:   1.  START taking Aspirin 81 MG: take one tab by mouth daily.  2.  START taking Lipitor 20 MG: take one tab by mouth daily.   *If you need a refill on your cardiac medications before your next appointment, please call your pharmacy*   Lab Work:  Your physician recommends that you return for a FASTING lipid profile: in 5 months (09/06/20)  - You will need to be fasting. Please do not have anything to eat or drink after midnight the morning you have the lab work. You may only have water or black coffee with no cream or sugar. - Please go to the [facility]. You will check in at the front desk to the right as you walk into the atrium. Valet Parking is offered if needed. - No appointment needed. You may go any day between 7 am and 6 pm.    Testing/Procedures: None Ordered   Follow-Up: At [facility], you and your health needs are our priority.  As part of our continuing mission to provide you with exceptional heart care, we have created designated Provider Care Teams.  These Care Teams include your primary Cardiologist (physician) and Advanced Practice Providers (APPs -  Physician Assistants and Nurse Practitioners) who all work together to provide you with the care you need, when you need it.  We recommend signing up for the patient portal called "MyChart".  Sign up information is provided on this After Visit Summary.  MyChart is used to connect with patients for Virtual Visits (Telemedicine).  Patients are able to view lab/test results, encounter notes, upcoming appointments, etc.  Non-urgent messages can be sent to your provider as well.   To learn more about what you can do with MyChart, go to [email or web address].    Your next appointment:   6 month(s)  The format for your next appointment:   In Person  Provider:   Debbe Odea, MD   Other  Instructions

## 2020-04-03 NOTE — Progress Notes (Signed)
Cardiology Office Note:    Date:  04/03/2020   ID:  Martha Wilson, DOB Mar 20, 1951, MRN [id number]  PCP:  Martha Mc, MD  [place] Cardiologist:  No primary care provider on file.  [facility] Electrophysiologist:  None   Referring MD: Martha Mc, MD   Chief Complaint  Patient presents with  . Other    ABN EKG. Meds reviewed verbally with pt.    History of Present Illness:    Martha Wilson is a 69 y.o. female with a hx CAD (lhc 2014 20% LAD), tricuspid regurgitation who presents for preop evaluation.  Patient has normal pressure hydrocephalus, VP shunt is being planned.  She denies chest pain, shortness of breath, edema.  Previous cardiologist was in [place].  She still gets yearly echocardiogram from cardiology seen [place].  She is being monitored for tricuspid valve insufficiency with serial echocardiograms.    Per reports in EMR, left heart cath August 2014 20% proximal LAD lesion, mild diffuse disease in RCA.  Apparently has a history of tricuspid valve prolapse with mild RV dilatation.  Outside echo report 01/2020 showed normal systolic function, EF [id number].  Moderately enlarged RV, RV normal systolic function, RVSP 32 mmHg.  Past Medical History:  Diagnosis Date  . Allergies   . Arthritis    right knee  . Hypothyroidism   . MVP (mitral valve prolapse)   . Osteopenia   . Palpitations    "stress related"  . Skin cancer   . Tricuspid valve prolapse   . Wears dentures    partial upper and lower    Past Surgical History:  Procedure Laterality Date  . BREAST BIOPSY  1980's   pt states she had a bx in the 80's, not sure of side, benign  . breat biopsy    . CATARACT EXTRACTION W/PHACO Right 09/22/2018   Procedure: CATARACT EXTRACTION PHACO AND INTRAOCULAR LENS PLACEMENT ([place])  RIGHT panooptix;  Surgeon: Birder Robson, MD;  Location: [place];  Service: Ophthalmology;  Laterality: Right;  . CATARACT EXTRACTION W/PHACO Left 10/06/2018    Procedure: CATARACT EXTRACTION PHACO AND INTRAOCULAR LENS PLACEMENT ([place]) LEFT PANOPTIX LENS;  Surgeon: Birder Robson, MD;  Location: [place];  Service: Ophthalmology;  Laterality: Left;  . COLONOSCOPY    . COLONOSCOPY WITH PROPOFOL N/A 11/19/2018   Procedure: COLONOSCOPY WITH PROPOFOL;  Surgeon: Jonathon Bellows, MD;  Location: [facility];  Service: Gastroenterology;  Laterality: N/A;  . ORIF FOOT FRACTURE      Current Medications: Current Meds  Medication Sig  . aspirin EC 81 MG tablet Take 1 tablet (81 mg total) by mouth daily. Swallow whole.  Marland Kitchen atorvastatin (LIPITOR) 20 MG tablet Take 1 tablet (20 mg total) by mouth daily.  . methimazole (TAPAZOLE) 5 MG tablet Take 5 mg by mouth daily.  . Multiple Vitamins-Minerals (PRESERVISION AREDS 2) CAPS Take by mouth.     Allergies:   Bee venom, Penicillins, and Adhesive [tape]   Social History   Socioeconomic History  . Marital status: Single    Spouse name: Not on file  . Number of children: Not on file  . Years of education: Not on file  . Highest education level: Not on file  Occupational History  . Not on file  Tobacco Use  . Smoking status: Current Some Day Smoker    Packs/day: 0.25    Years: 35.00    Pack years: 8.75    Types: Cigarettes  . Smokeless tobacco: Never Used  . Tobacco  comment: "occasional" cigarette  Vaping Use  . Vaping Use: Never used  Substance and Sexual Activity  . Alcohol use: Yes    Alcohol/week: 3.0 standard drinks    Types: 3 Glasses of wine per week    Comment: none last 24hrs  . Drug use: Never  . Sexual activity: Not Currently  Other Topics Concern  . Not on file  Social History Narrative  . Not on file   Social Determinants of Health   Financial Resource Strain: Not on file  Food Insecurity: Not on file  Transportation Needs: Not on file  Physical Activity: Not on file  Stress: Not on file  Social Connections: Not on file     Family History: The patient's family  history includes Early death in her brother; Heart attack in her father; Thyroid disease in her brother.  ROS:   Please see the history of present illness.     All other systems reviewed and are negative.  EKGs/Labs/Other Studies Reviewed:    The following studies were reviewed today: Echocardiogram 01/10/2020 1. Normal left ventricular chamber size. Normal wall motion and systolic  function. The biplane LVEF is 63%.  2. Moderately enlarged right ventricle with normal systolic function.  3. The tricuspid valve is not well-visualized. There is an eccentric jet of  tricuspid regurgitation directed towards the septum. Visually, it only  appears mild in severity but may be underestimated due to incomplete  visualization.  4. The estimated right ventricular systolic pressure is 32 mmHg.  5. Inferior vena cava is not well visualized but appears normal in  dimension. The hepatic veins were not visualized.  6. When directly compared to the prior study from 01/11/2019, the TR appears  similar in severity.    EKG:  EKG is  ordered today.  The ekg ordered today demonstrates normal sinus rhythm  Recent Labs: 11/16/2019: ALT 21; BUN 13; Creatinine, Ser 0.98; Magnesium 1.9; Potassium 4.3; Sodium 139; TSH 2.11 12/21/2019: Hemoglobin 14.7; Platelets 291.0  Recent Lipid Panel    Component Value Date/Time   CHOL 196 11/16/2019 0912   TRIG 136.0 11/16/2019 0912   HDL 75.10 11/16/2019 0912   CHOLHDL 3 11/16/2019 0912   VLDL 27.2 11/16/2019 0912   LDLCALC 94 11/16/2019 0912     Risk Assessment/Calculations:      Physical Exam:    VS:  BP 100/72 (BP Location: Right Arm, Patient Position: Sitting, Cuff Size: Normal)   Pulse 83   Ht 5' 5.5" (1.664 m)   Wt 133 lb (60.3 kg)   SpO2 98%   BMI 21.80 kg/m     Wt Readings from Last 3 Encounters:  04/03/20 133 lb (60.3 kg)  11/30/19 129 lb (58.5 kg)  11/16/19 129 lb (58.5 kg)     GEN:  Well nourished, well developed in no  acute distress HEENT: Normal NECK: No JVD; No carotid bruits LYMPHATICS: No lymphadenopathy CARDIAC: RRR, no murmurs, rubs, gallops RESPIRATORY:  Clear to auscultation without rales, wheezing or rhonchi  ABDOMEN: Soft, non-tender, non-distended MUSCULOSKELETAL:  No edema; No deformity  SKIN: Warm and dry NEUROLOGIC:  Alert and oriented x 3 PSYCHIATRIC:  Normal affect   ASSESSMENT:    1. Pre-op exam   2. Coronary artery disease involving native coronary artery of native heart without angina pectoris   3. Nonrheumatic tricuspid valve regurgitation    PLAN:    In order of problems listed above:  1. Preop eval, VP shunt.  Patient denies any chest pain or shortness  of breath.  Last echocardiogram with normal function no significant valvular abnormalities.  Left heart cath 2014 with minimal CAD.  No additional testing required or indicated prior to surgical procedure.  Patient can proceed with procedure from a cardiac perspective without any additional testing. 2. Nonobstructive CAD, minimal diffuse RCA, 20% proximal LAD.  Start aspirin 81 mg, Lipitor 20 mg daily.  Goal LDL less than 70.  Repeat fasting lipid profile in 5 to 6 months 3. History of tricuspid regurgitation, moderate RV dilatation, monitor with serial echocardiograms yearly.  Next echo should be after October 2022.  Follow-up in 6 months.   Medication Adjustments/Labs and Tests Ordered: Current medicines are reviewed at length with the patient today.  Concerns regarding medicines are outlined above.  Orders Placed This Encounter  Procedures  . Lipid panel  . EKG 12-Lead   Meds ordered this encounter  Medications  . aspirin EC 81 MG tablet    Sig: Take 1 tablet (81 mg total) by mouth daily. Swallow whole.    Dispense:  30 tablet    Refill:  5  . atorvastatin (LIPITOR) 20 MG tablet    Sig: Take 1 tablet (20 mg total) by mouth daily.    Dispense:  30 tablet    Refill:  5    Patient Instructions  Medication  Instructions:  Your physician has recommended you make the following change in your medication:   1.  START taking Aspirin 81 MG: take one tab by mouth daily.  2.  START taking Lipitor 20 MG: take one tab by mouth daily.   *If you need a refill on your cardiac medications before your next appointment, please call your pharmacy*   Lab Work:  Your physician recommends that you return for a FASTING lipid profile: in 5 months (09/06/20)  - You will need to be fasting. Please do not have anything to eat or drink after midnight the morning you have the lab work. You may only have water or black coffee with no cream or sugar. - Please go to the [facility]. You will check in at the front desk to the right as you walk into the atrium. Valet Parking is offered if needed. - No appointment needed. You may go any day between 7 am and 6 pm.    Testing/Procedures: None Ordered   Follow-Up: At [facility], you and your health needs are our priority.  As part of our continuing mission to provide you with exceptional heart care, we have created designated Provider Care Teams.  These Care Teams include your primary Cardiologist (physician) and Advanced Practice Providers (APPs -  Physician Assistants and Nurse Practitioners) who all work together to provide you with the care you need, when you need it.  We recommend signing up for the patient portal called "MyChart".  Sign up information is provided on this After Visit Summary.  MyChart is used to connect with patients for Virtual Visits (Telemedicine).  Patients are able to view lab/test results, encounter notes, upcoming appointments, etc.  Non-urgent messages can be sent to your provider as well.   To learn more about what you can do with MyChart, go to [email or web address].    Your next appointment:   6 month(s)  The format for your next appointment:   In Person  Provider:   Kate Sable, MD   Other Instructions       Signed, Kate Sable, MD  04/03/2020 4:43 PM    [place]

## 2020-04-17 ENCOUNTER — Telehealth: Payer: Self-pay | Admitting: Internal Medicine

## 2020-04-17 DIAGNOSIS — E0590 Thyrotoxicosis, unspecified without thyrotoxic crisis or storm: Secondary | ICD-10-CM

## 2020-04-17 NOTE — Telephone Encounter (Signed)
Rejection Reason - Patient did not respond - Unable to contact patient to reschedule appointment. Please confirm that patient does desire an appointment with endocrinology. If so, please place a new referral for this patient." [facility] said on Apr 17, 2020 3:03 PM  "Appointment with Dr. Honor Junes on 03/30/2020 cancelled as Dr. Honor Junes will be out of the office. Pt was contacted on 03/13/20 and 03/27/20 but has not rescheduled this appointment." [facility] said on Apr 04, 2020 8:34 AM

## 2020-04-18 NOTE — Telephone Encounter (Signed)
You have requested a referral endocrinology.  I was happy to do so,  but please be advised that the referral process is time consuming and involves work on the part of at least 3 staff members:  your physician/provider,  our referral coordinator,  and the scheduler in the office to which the referral is made.  Since you did not return the call from the scheduler,  the referral is going to be closed if they do not hear from you by the end of the week.    You will need to schedule another office visit with me before I will start the process over.  Regards,   Deborra Medina, MD

## 2020-04-18 NOTE — Telephone Encounter (Signed)
Spoke with pt and she stated that she is unsure as to whether she wants to keep the endocrinology referral. Pt was advised that if she did she would need to call them byt the end of this week to schedule an appt or the referral will be closed.

## 2020-04-26 ENCOUNTER — Ambulatory Visit: Payer: Medicare HMO | Admitting: Internal Medicine

## 2020-04-26 ENCOUNTER — Encounter: Payer: Self-pay | Admitting: Internal Medicine

## 2020-04-26 ENCOUNTER — Telehealth: Payer: Self-pay | Admitting: Internal Medicine

## 2020-04-26 ENCOUNTER — Other Ambulatory Visit: Payer: Self-pay

## 2020-04-26 VITALS — BP 92/60 | HR 75 | Temp 98.5°F | Resp 15 | Ht 65.5 in | Wt 132.0 lb

## 2020-04-26 DIAGNOSIS — G912 (Idiopathic) normal pressure hydrocephalus: Secondary | ICD-10-CM | POA: Diagnosis not present

## 2020-04-26 DIAGNOSIS — R8271 Bacteriuria: Secondary | ICD-10-CM

## 2020-04-26 DIAGNOSIS — R8281 Pyuria: Secondary | ICD-10-CM | POA: Insufficient documentation

## 2020-04-26 DIAGNOSIS — R51 Headache with orthostatic component, not elsewhere classified: Secondary | ICD-10-CM | POA: Insufficient documentation

## 2020-04-26 MED ORDER — FLUCONAZOLE 150 MG PO TABS: 150.0000 mg | ORAL_TABLET | Freq: Every day | ORAL | 0 refills | Status: DC

## 2020-04-26 NOTE — Progress Notes (Signed)
Subjective:  Patient ID: Martha Wilson, female    DOB: 1950/05/09  Age: 70 y.o. MRN: [id number]  CC: The primary encounter diagnosis was Asymptomatic bacteriuria. 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  HPI Martha Wilson presents for follow up on an abnormal urinalysis  During eval and treatment for NPH at [facility]    This visit occurred during the SARS-CoV-2 public health emergency.  Safety protocols were in place, including screening questions prior to the visit, additional usage of staff PPE, and extensive cleaning of exam room while observing appropriate contact time as indicated for disinfecting solutions.   Martha Wilson is a 70 y.o. female with PMH hyperthyroidism, tricuspid insufficiency, CAD, who was recently found to have cerebral ventriculomegaly during evaluation by Dr Izora Ribas for worsening gait ataxia and dizziness. She had attempted PT without improvement and her balance worsened to the point where she is now using a cane to ambulate. Per Dr. Rhea Bleacher documentation, she had an evaluation with PT back in October of 2021 and her Tinetti was 21/28 and December was 17/28. She was also endorsing urinary frequency without incontinence.   Diagnosed with  NPH, she presentation  to [facility] on 04/11/2019 for a Lumbar Drain Trial, which was then rescheduled for 04/17/2020 in order to hold patient's ASA prior to LD placement.  Hospital Course: The patient presented on 04/17/2020 after suspending aspirin for one week and underwent a lumbar drain trial placement without complication for management of NPH diagnosed recently in the setting of gait instability and recurrent falls.    Baseline Tinetti was 18/20, which was decreased from Tinetti from 04/10/2020 one week prior of 20/28. Baseline SLUMS score was 28/30. Serial PT/OT checks continued for 3 days with Tinetti scores of 24 and 24. And a  SLUMS scores of 30 and 30. Based on her initial presentation and improvement in symptoms, she will be scheduled for a VP shunt when elective surgeries are able to be scheduled again.   She has been having a recurrent  positionally dependent moderate to severe headache since discharge. The headache is  Relieved by lying supine and on her side.  Aggravated by bending over.   (Her LD was removed on 04/19/2020. An absorbable buddy suture was in place. CSF was sent prior to drain removal for culture).  Admission labwork demonstrated urinalysis with 3+ leukocytes, so patient was started on cipro 500mg  BID x5 days and urine culture sent. Urine culture showed contamination. However, she was to complete a 5 days course of antibiotics. Close outpatient follow up with her PCP was in place for the following week for re-evaluation.  Patient states that she did not have a foley catheterization during the hospitalization .   CSF culture was negative for bacteria  Developed vaginal itching yesterday .  Did not take probiotics during the 5 days of cipro which she finished on Jan 15 Has an external stitch in lower spine that will be removed today by dr Theodore Demark nurse  .     Outpatient Medications Prior to Visit  Medication Sig Dispense Refill  . methimazole (TAPAZOLE) 5 MG tablet Take 5 mg by mouth daily.    . Multiple Vitamins-Minerals (PRESERVISION AREDS 2) CAPS Take by mouth.    Marland Kitchen aspirin EC 81 MG tablet Take 1 tablet (81 mg total) by mouth daily. Swallow whole. (Patient not taking: Reported on 04/26/2020) 30 tablet 5  . atorvastatin (LIPITOR) 20 MG tablet Take 1 tablet (20  mg total) by mouth daily. (Patient not taking: Reported on 04/26/2020) 30 tablet 5   No facility-administered medications prior to visit.    Review of Systems;  Patient denies headache, fevers, malaise, unintentional weight loss, skin rash, eye pain, sinus congestion and sinus pain, sore throat, dysphagia,  hemoptysis , cough, dyspnea, wheezing,  chest pain, palpitations, orthopnea, edema, abdominal pain, nausea, melena, diarrhea, constipation, flank pain, dysuria, hematuria, urinary  Frequency, nocturia, numbness, tingling, seizures,  Focal weakness, Loss of consciousness,  Tremor, insomnia, depression, anxiety, and suicidal ideation.      Objective:  BP 92/60 (BP Location: Left Arm, Patient Position: Sitting, Cuff Size: Normal)   Pulse 75   Temp 98.5 F (36.9 C) (Oral)   Resp 15   Ht 5' 5.5" (1.664 m)   Wt 132 lb (59.9 kg)   SpO2 98%   BMI 21.63 kg/m   BP Readings from Last 3 Encounters:  04/26/20 92/60  04/03/20 100/72  11/30/19 98/78    Wt Readings from Last 3 Encounters:  04/26/20 132 lb (59.9 kg)  04/03/20 133 lb (60.3 kg)  11/30/19 129 lb (58.5 kg)    General appearance: alert, cooperative and appears stated age Ears: normal TM's and external ear canals both ears Throat: lips, mucosa, and tongue normal; teeth and gums normal Neck: no adenopathy, no carotid bruit, supple, symmetrical, trachea midline and thyroid not enlarged, symmetric, no tenderness/mass/nodules Back: symmetric, no curvature. Midline surgical scar with external suture in place. No redness swelling or drainage . Lungs: clear to auscultation bilaterally Heart: regular rate and rhythm, S1, S2 normal, no murmur, click, rub or gallop Abdomen: soft, non-tender; bowel sounds normal; no masses,  no organomegaly Pulses: 2+ and symmetric Skin: Skin color, texture, turgor normal. No rashes or lesions Lymph nodes: Cervical, supraclavicular, and axillary nodes normal.  Lab Results  Component Value Date   HGBA1C 5.8 11/16/2019    Lab Results  Component Value Date   CREATININE 0.98 11/16/2019    Lab Results  Component Value Date   WBC 7.9 12/21/2019   HGB 14.7 12/21/2019   HCT 43.0 12/21/2019   PLT 291.0 12/21/2019   GLUCOSE 92 11/16/2019   CHOL 196 11/16/2019   TRIG 136.0 11/16/2019   HDL 75.10 11/16/2019   LDLCALC 94 11/16/2019   ALT 21  11/16/2019   AST 20 11/16/2019   NA 139 11/16/2019   K 4.3 11/16/2019   CL 103 11/16/2019   CREATININE 0.98 11/16/2019   BUN 13 11/16/2019   CO2 26 11/16/2019   TSH 2.11 11/16/2019   HGBA1C 5.8 11/16/2019    MR BRAIN WO CONTRAST  Result Date: 12/27/2019 CLINICAL DATA:  Unsteady gait EXAM: MRI HEAD WITHOUT CONTRAST MRI CERVICAL SPINE WITHOUT CONTRAST TECHNIQUE: Multiplanar, multiecho pulse sequences of the brain and surrounding structures, and cervical spine, to include the craniocervical junction and cervicothoracic junction, were obtained without intravenous contrast. COMPARISON:  None. FINDINGS: MRI HEAD FINDINGS Brain: Moderate cerebral atrophy with ex vacuo dilatation. Mild chronic microvascular ischemic changes. No midline shift, mass lesion or extra-axial fluid collection. Lateral and third ventricular dilatation is out of proportion to cerebral volume loss. No intracranial hemorrhage. No diffusion-weighted signal abnormality. Vascular: Preserved major intracranial flow voids. Skull and upper cervical spine: Normal marrow signal. Sinuses/Orbits: Normal orbits. Sequela of bilateral lens replacement. Clear paranasal sinuses. No mastoid effusion. Other: None. MRI CERVICAL SPINE FINDINGS Alignment: Straightening of lordosis. Minimal grade 1 C3-4 and C7-T1 anterolisthesis. Minimal grade 1 C4-5 retrolisthesis. Vertebrae: No focal osseous lesion. Modic  type 1 endplate degenerative changes at the C4-5 level. Cord: Normal signal and morphology. Posterior Fossa, vertebral arteries: Please see MRI head. Disc levels: Multilevel desiccation, osteophytosis and disc space loss most prominent at the C4-6 levels. C2-3: Bilateral facet hypertrophy. Patent spinal canal and neural foramen. C3-4: Disc osteophyte complex with small central protrusion, uncovertebral and facet degenerative spurring. Mild spinal canal and bilateral neural foraminal narrowing. C4-5: Disc osteophyte complex with uncovertebral and facet  degenerative spurring. Mild to moderate spinal canal, moderate right and mild left neural foraminal narrowing. C5-6: Disc osteophyte complex with uncovertebral and bilateral facet hypertrophy. Mild to moderate spinal canal, mild right and moderate left neural foraminal narrowing. C6-7: Left predominant uncovertebral and facet hypertrophy. Mild left neural foraminal narrowing. Patent spinal canal and right neural foramen. C7-T1: No significant disc bulge, spinal canal or neural foraminal narrowing. Paraspinal tissues: Negative. IMPRESSION: MRI HEAD: No acute intracranial process. Diffuse cerebral atrophy and chronic microvascular ischemic changes. Lateral and third ventricular dilatation out of proportion to cerebral volume loss and suspicious for normal pressure hydrocephalus. Consider dedicated CSF flow study for further evaluation. MRI CERVICAL SPINE: Mild to moderate spinal canal and bilateral neural foraminal narrowing at the C4-5 and C5-6 levels. Mild spinal canal and bilateral neural foraminal narrowing at C3-4. Mild left C6-7 neural foraminal narrowing. These results will be called to the ordering clinician or representative by the Radiologist Assistant, and communication documented in the PACS or [employer]. Electronically Signed   By: Primitivo Gauze M.D.   On: 12/27/2019 16:36   MR CERVICAL SPINE WO CONTRAST  Result Date: 12/27/2019 CLINICAL DATA:  Unsteady gait EXAM: MRI HEAD WITHOUT CONTRAST MRI CERVICAL SPINE WITHOUT CONTRAST TECHNIQUE: Multiplanar, multiecho pulse sequences of the brain and surrounding structures, and cervical spine, to include the craniocervical junction and cervicothoracic junction, were obtained without intravenous contrast. COMPARISON:  None. FINDINGS: MRI HEAD FINDINGS Brain: Moderate cerebral atrophy with ex vacuo dilatation. Mild chronic microvascular ischemic changes. No midline shift, mass lesion or extra-axial fluid collection. Lateral and third ventricular  dilatation is out of proportion to cerebral volume loss. No intracranial hemorrhage. No diffusion-weighted signal abnormality. Vascular: Preserved major intracranial flow voids. Skull and upper cervical spine: Normal marrow signal. Sinuses/Orbits: Normal orbits. Sequela of bilateral lens replacement. Clear paranasal sinuses. No mastoid effusion. Other: None. MRI CERVICAL SPINE FINDINGS Alignment: Straightening of lordosis. Minimal grade 1 C3-4 and C7-T1 anterolisthesis. Minimal grade 1 C4-5 retrolisthesis. Vertebrae: No focal osseous lesion. Modic type 1 endplate degenerative changes at the C4-5 level. Cord: Normal signal and morphology. Posterior Fossa, vertebral arteries: Please see MRI head. Disc levels: Multilevel desiccation, osteophytosis and disc space loss most prominent at the C4-6 levels. C2-3: Bilateral facet hypertrophy. Patent spinal canal and neural foramen. C3-4: Disc osteophyte complex with small central protrusion, uncovertebral and facet degenerative spurring. Mild spinal canal and bilateral neural foraminal narrowing. C4-5: Disc osteophyte complex with uncovertebral and facet degenerative spurring. Mild to moderate spinal canal, moderate right and mild left neural foraminal narrowing. C5-6: Disc osteophyte complex with uncovertebral and bilateral facet hypertrophy. Mild to moderate spinal canal, mild right and moderate left neural foraminal narrowing. C6-7: Left predominant uncovertebral and facet hypertrophy. Mild left neural foraminal narrowing. Patent spinal canal and right neural foramen. C7-T1: No significant disc bulge, spinal canal or neural foraminal narrowing. Paraspinal tissues: Negative. IMPRESSION: MRI HEAD: No acute intracranial process. Diffuse cerebral atrophy and chronic microvascular ischemic changes. Lateral and third ventricular dilatation out of proportion to cerebral volume loss and suspicious for normal pressure  hydrocephalus. Consider dedicated CSF flow study for further  evaluation. MRI CERVICAL SPINE: Mild to moderate spinal canal and bilateral neural foraminal narrowing at the C4-5 and C5-6 levels. Mild spinal canal and bilateral neural foraminal narrowing at C3-4. Mild left C6-7 neural foraminal narrowing. These results will be called to the ordering clinician or representative by the Radiologist Assistant, and communication documented in the PACS or [employer]. Electronically Signed   By: Primitivo Gauze M.D.   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 She is awaiting placement of VP shunt       Positional headache    Secondary to recent Lumbar drain.  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  She was treated on the basis of pyuria with antibiotic for several  Days due to her recent CSF puncture and now has vaginal itching .  Empiric fluconazole ordered, and repeat  UAculture ordered        Other Visit Diagnoses    Asymptomatic bacteriuria    -  Primary   Relevant Orders   Urinalysis, Routine w reflex microscopic   Urine Culture      I am having Martha Wilson start on fluconazole. I am also having her maintain her methimazole, PreserVision AREDS 2, aspirin EC, and atorvastatin.  Meds ordered this encounter  Medications  . fluconazole (DIFLUCAN) 150 MG tablet    Sig: Take 1 tablet (150 mg total) by mouth daily.    Dispense:  2 tablet    Refill:  0    There are no discontinued medications.  Follow-up: No follow-ups on file.   [name], MD

## 2020-04-26 NOTE — Assessment & Plan Note (Signed)
Secondary to recent Lumbar drain.  Blood patch offered today by Dr Rudene Christians, but deferred

## 2020-04-26 NOTE — Assessment & Plan Note (Signed)
Mixed flora noted in culture.  She was treated on the basis of pyuria with antibiotic for several  Days due to her recent CSF puncture and now has vaginal itching .  Empiric fluconazole ordered, and repeat  UAculture ordered

## 2020-04-26 NOTE — Patient Instructions (Addendum)
Suspend the aspirin until your procedure has been done,  To avoid delays  Fluconazole 150 mg daily for 2 days  For the itching   You can add up to 2000 mg of acetominophen (tylenol) every day safely  In divided doses (500 mg every 6 hours  Or 1000 mg every 12 hours.)  If the urinalysis and culture suggest infection,  We will treat  Ok to take leisurely walks in the house , but no bending over,  Squatting,  Strenuous house   I agree with starting the crestor twice a week .  If you tolerate the dosing,  Increase  Gradually until you are taking it 7 days per  Week   You can add a little salt to your diet .  It will increase your blood pressure a little bit  You have an external stitch  In your lower spine.  Let's keep it covered  Until you talk  To your  neurologist

## 2020-04-26 NOTE — Telephone Encounter (Signed)
Patient called and wanted to apologize to Dr. Derrel Nip for taking a phone call from another doctor. She felt bad for doing that to Dr. Derrel Nip, but her stitches are out.

## 2020-04-26 NOTE — Assessment & Plan Note (Signed)
Diagnosed by MRi and lumbar puncture. She is awaiting placement of VP shunt

## 2020-04-27 LAB — URINALYSIS, ROUTINE W REFLEX MICROSCOPIC
Bilirubin Urine: NEGATIVE
Hgb urine dipstick: NEGATIVE
Ketones, ur: NEGATIVE
Leukocytes,Ua: NEGATIVE
Nitrite: NEGATIVE
RBC / HPF: NONE SEEN (ref 0–?)
Specific Gravity, Urine: 1.015 (ref 1.000–1.030)
Total Protein, Urine: NEGATIVE
Urine Glucose: NEGATIVE
Urobilinogen, UA: 0.2 (ref 0.0–1.0)
pH: 5.5 (ref 5.0–8.0)

## 2020-04-27 LAB — URINE CULTURE
MICRO NUMBER:: 11433583
Result:: NO GROWTH
SPECIMEN QUALITY:: ADEQUATE

## 2020-04-28 ENCOUNTER — Telehealth: Payer: Self-pay

## 2020-04-28 NOTE — Telephone Encounter (Signed)
LMTCB in regards to lab results.  

## 2020-05-03 ENCOUNTER — Other Ambulatory Visit: Payer: Self-pay | Admitting: Neurosurgery

## 2020-05-19 ENCOUNTER — Other Ambulatory Visit: Payer: Self-pay

## 2020-05-19 ENCOUNTER — Encounter
Admission: RE | Admit: 2020-05-19 | Discharge: 2020-05-19 | Disposition: A | Discharge status: Home / Self Care | Payer: Medicare HMO | Source: Ambulatory Visit | Attending: Neurosurgery | Admitting: Neurosurgery

## 2020-05-19 DIAGNOSIS — Z01812 Encounter for preprocedural laboratory examination: Secondary | ICD-10-CM | POA: Insufficient documentation

## 2020-05-19 HISTORY — DX: Personal history of Methicillin resistant Staphylococcus aureus infection: Z86.14

## 2020-05-19 HISTORY — DX: Headache, unspecified: R51.9

## 2020-05-19 LAB — SURGICAL PCR SCREEN
MRSA, PCR: NEGATIVE
Staphylococcus aureus: NEGATIVE

## 2020-05-19 LAB — TYPE AND SCREEN
ABO/RH(D): A POS
Antibody Screen: NEGATIVE

## 2020-05-19 NOTE — Patient Instructions (Addendum)
Your procedure is scheduled on:05-31-20 WEDNESDAY Report to the Registration Desk on the 1st floor of the [facility]-Then proceed to the 2nd floor Surgery Desk in the Lane To find out your arrival time, please call [phone] between 1PM - 3PM on:05-30-20 TUESDAY  REMEMBER: Instructions that are not followed completely may result in serious medical risk, up to and including death; or upon the discretion of your surgeon and anesthesiologist your surgery may need to be rescheduled.  Do not eat food after midnight the night before surgery.  No gum chewing, lozengers or hard candies.  You may however, drink CLEAR liquids up to 2 hours before you are scheduled to arrive for your surgery. Do not drink anything within 2 hours of your scheduled arrival time.  Clear liquids include: - water  - apple juice without pulp - gatorade  - black coffee or tea (Do NOT add milk or creamers to the coffee or tea) Do NOT drink anything that is not on this list.  TAKE THESE MEDICATIONS THE MORNING OF SURGERY WITH A SIP OF WATER: -NONE  Follow recommendations from Cardiologist, Pulmonologist or PCP regarding stopping Aspirin, Coumadin, Plavix, Eliquis, Pradaxa, or Pletal-ASPIRIN WAS STOPPED ON 04-09-20   One week prior to surgery: Stop Anti-inflammatories (NSAIDS) such as Advil, Aleve, Ibuprofen, Motrin, Naproxen, Naprosyn and Aspirin based products such as Excedrin, Goodys Powder, BC Powder-OK TO TAKE TYLENOL IF NEEDED  Stop ANY OVER THE COUNTER supplements until after surgery-STOP YOUR PRESERVISION, VITAMIN B 6 AND TURMERIC 7 DAYS PRIOR TO SURGERY-YOU MAY RESUME AFTER SURGERY (However, you may continue taking Vitamin D, Vitamin B12, and magnesium up until the day before surgery.)  No Alcohol for 24 hours before or after surgery.  No Smoking including e-cigarettes for 24 hours prior to surgery.  No chewable tobacco products for at least 6 hours prior to surgery.  No nicotine patches on the day  of surgery.  Do not use any "recreational" drugs for at least a week prior to your surgery.  Please be advised that the combination of cocaine and anesthesia may have negative outcomes, up to and including death. If you test positive for cocaine, your surgery will be cancelled.  On the morning of surgery brush your teeth with toothpaste and water, you may rinse your mouth with mouthwash if you wish. Do not swallow any toothpaste or mouthwash.  Do not wear jewelry, make-up, hairpins, clips or nail polish.  Do not wear lotions, powders, or perfumes.   Do not shave body from the neck down 48 hours prior to surgery just in case you cut yourself which could leave a site for infection.  Also, freshly shaved skin may become irritated if using the CHG soap.  Contact lenses, hearing aids and dentures may not be worn into surgery.  Do not bring valuables to the hospital. [facility] is not responsible for any missing/lost belongings or valuables.   Use CHG Soap as directed on instruction sheet.  Notify your doctor if there is any change in your medical condition (cold, fever, infection).  Wear comfortable clothing (specific to your surgery type) to the hospital.  Plan for stool softeners for home use; pain medications have a tendency to cause constipation. You can also help prevent constipation by eating foods high in fiber such as fruits and vegetables and drinking plenty of fluids as your diet allows.  After surgery, you can help prevent lung complications by doing breathing exercises.  Take deep breaths and cough every 1-2 hours.  Your doctor may order a device called an Incentive Spirometer to help you take deep breaths. When coughing or sneezing, hold a pillow firmly against your incision with both hands. This is called "splinting." Doing this helps protect your incision. It also decreases belly discomfort.  If you are being admitted to the hospital overnight, leave your suitcase in the  car. After surgery it may be brought to your room.  If you are being discharged the day of surgery, you will not be allowed to drive home. You will need a responsible adult (18 years or older) to drive you home and stay with you that night.   If you are taking public transportation, you will need to have a responsible adult (18 years or older) with you. Please confirm with your physician that it is acceptable to use public transportation.   Please call the [facility] at [phone] if you have any questions about these instructions.  Visitation Policy:  Patients undergoing a surgery or procedure may have one family member or support person with them as long as that person is not COVID-19 positive or experiencing its symptoms.  That person may remain in the waiting area during the procedure.  Inpatient Visitation:    Visiting hours are 7 a.m. to 8 p.m. Patients will be allowed one visitor. The visitor may change daily. The visitor must pass COVID-19 screenings, use hand sanitizer when entering and exiting the patient's room and wear a mask at all times, including in the patient's room. Patients must also wear a mask when staff or their visitor are in the room. Masking is required regardless of vaccination status. Systemwide, no visitors 17 or younger.

## 2020-05-22 ENCOUNTER — Encounter: Payer: Self-pay | Admitting: Neurosurgery

## 2020-05-22 NOTE — Progress Notes (Signed)
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 Clinical nursing staff has updated patient's PMH/PSHx, current medication list, and drug allergies/intolerances to ensure comprehensive history available to assist in medical decision making as it pertains to the aforementioned surgical procedure and anticipated anesthetic course.   Clinical Discussion:  Martha Wilson is a 70 y.o. female who is submitted for pre-surgical anesthesia review and clearance prior to her undergoing the above procedure. Patient is a Current Smoker (8.75 pack years). Pertinent PMH includes: Valvular heart disease (MVP, TVP), stress-induced palpitations, hyperthyroidism, normal pressure hydrocephalus, OA.  Patient previously followed by cardiology in [place] Claybon Jabs, MD).  Patient referred to local cardiologist to establish care and for preoperative clearance.  Patient was seen on 04/03/2020 by local cardiology provider Martha Lah, MD); notes reviewed.  At the time of her clinic visit, patient doing well overall from a cardiovascular perspective.  She denies chest pain, shortness of breath, PND, orthopnea, palpitations, peripheral edema, vertiginous symptoms, and presyncope/syncope.  Patient with PMH (+) for valvular heart disease.  She reports that she undergoes annual echocardiography evaluations in order to monitor her mitral  and tricuspid valve regurgitation.  Diagnostic left heart catheterization performed and 11/2012 revealed mild diffuse disease in the RCA and a 20% proximal stenosis of the LAD.  Last TTE performed on 01/10/2020 revealed normal left ventricular systolic function with a biplane LVEF of 63%.  Despite mild enlargement of the right ventricle, there was normal RV systolic function with an RVSP of 32 mmHg noted (see full interpretation of cardiovascular test below).  ECG performed in the office revealed NSR with SA at a rate of 83 bpm; no evidence of ST or T wave abnormalities.  Blood pressure well controlled at 100/72.  Given patient's diagnosis of nonobstructive CAD, she was started on daily low-dose ASA and a statin for risk factor modification; goal LDL <70.  FLP will be rechecked in 5 to 6 months.  No other changes were made to patient's medication regimen.  Patient follow-up with cardiology in 6 months or sooner if needed.  Patient to undergo VP shunt insertion for treatment of her normal pressure hydrocephalus on 05/31/2020 with Dr. Meade Maw.  Given patient's past medical history significant for cardiovascular disease, presurgical cardiac clearance was sought by the PAT team.  Per cardiology, "patient denies chest pain or shortness of breath.  Last echocardiogram with normal function and no significant valvular abnormalities.  LHC in 2014 with minimal CAD.  No additional testing required or indicated prior to surgical procedure.  Patient may proceed with planned procedural course from a cardiac perspective without any additional testing at this time".  This patient is on daily antiplatelet therapy at this point.  She has been instructed on recommendations for holding her daily low dose ASA prior to her procedure.  Patient advising that she previously stopped this medication for procedure that she had done at [facility] and never restarted  it.  Patient has not taken ASA dose since 04/09/2020.  Patient denies  previous perioperative complications with anesthesia in the past. In review of the available records, it is noted that patient underwent a general anesthetic course here (ASA II) in 11/2018 without documented complications.   Vitals with BMI 05/19/2020 04/26/2020 04/03/2020  Height 5' 5.5" 5' 5.5" -  Weight 135 lbs 2 oz 132 lbs -  BMI 80.99 83.38 -  Systolic 250 92 539  Diastolic 83 60 72  Pulse 79 75 -    Providers/Specialists:   NOTE: Primary physician provider listed below. Patient may have been seen by APP or partner within same practice.   PROVIDER ROLE / SPECIALTY LAST Dola Factor, MD Neurosurgery  05/03/2020  [name], MD Primary Care Provider  04/26/2020  Kate Sable, MD Cardiology  04/03/2020   Allergies:  Bee venom, Latex, Penicillins, and Adhesive [tape]  Current Home Medications:   No current facility-administered medications for this encounter.   Marland Kitchen acetaminophen (TYLENOL) 500 MG tablet  . Cyanocobalamin (B-12) 2500 MCG TABS  . Magnesium 400 MG CAPS  . methimazole (TAPAZOLE) 5 MG tablet  . Multiple Vitamins-Minerals (PRESERVISION AREDS 2) CAPS  . Polyvinyl Alcohol-Povidone (REFRESH OP)  . pyridOXINE (VITAMIN B-6) 100 MG tablet  . Turmeric 500 MG CAPS  . aspirin EC 81 MG tablet  . atorvastatin (LIPITOR) 20 MG tablet  . cholecalciferol (VITAMIN D3) 25 MCG (1000 UNIT) tablet  . fluconazole (DIFLUCAN) 150 MG tablet  . Lifitegrast (XIIDRA) 5 % SOLN   History:   Past Medical History:  Diagnosis Date  . Allergies   . Arthritis    right knee  . Headache   . History of methicillin resistant staphylococcus aureus (MRSA)   . Hyperthyroidism   . MVP (mitral valve prolapse)   . Normal pressure hydrocephalus (HCC)   . Osteopenia   . Palpitations    "stress related"  . Skin cancer    BASAL CELL  . Tricuspid valve prolapse   . Wears dentures    partial upper and lower   Past Surgical History:  Procedure Laterality Date  . BREAST  BIOPSY  1980's   pt states she had a bx in the 80's, not sure of side, benign  . breat biopsy    . CATARACT EXTRACTION W/PHACO Right 09/22/2018   Procedure: CATARACT EXTRACTION PHACO AND INTRAOCULAR LENS PLACEMENT ([place])  RIGHT panooptix;  Surgeon: Birder Robson, MD;  Location: [place];  Service: Ophthalmology;  Laterality: Right;  . CATARACT EXTRACTION W/PHACO Left 10/06/2018   Procedure: CATARACT EXTRACTION PHACO AND INTRAOCULAR LENS PLACEMENT ([place]) LEFT PANOPTIX LENS;  Surgeon: Birder Robson, MD;  Location: [place];  Service: Ophthalmology;  Laterality: Left;  . COLONOSCOPY    . COLONOSCOPY WITH PROPOFOL N/A 11/19/2018   Procedure: COLONOSCOPY WITH PROPOFOL;  Surgeon: Jonathon Bellows, MD;  Location: [facility] ENDOSCOPY;  Service: Gastroenterology;  Laterality: N/A;  . ORIF FOOT FRACTURE     Family History  Problem Relation Age of Onset  . Heart attack Father   . Thyroid disease Brother   . Early death Brother    Social History   Tobacco Use  . Smoking status: Current Every Day Smoker    Packs/day: 0.25    Years: 35.00    Pack years: 8.75    Types: Cigarettes  . Smokeless tobacco: Never Used  . Tobacco comment: "occasional" cigarette  Vaping Use  . Vaping Use: Never used  Substance Use Topics  .  Alcohol use: Yes    Alcohol/week: 3.0 standard drinks    Types: 3 Glasses of wine per week    Comment: OCC  . Drug use: Never    Pertinent Clinical Results:  LABS: Labs reviewed: Acceptable for surgery.      No visits with results within 3 Day(s) from this visit.  Latest known visit with results is:  Hospital Outpatient Visit on 05/19/2020  Component Date Value Ref Range Status  . MRSA, PCR 05/19/2020 NEGATIVE  NEGATIVE Final  . Staphylococcus aureus 05/19/2020 NEGATIVE  NEGATIVE Final   Comment: (NOTE) The Xpert SA Assay (FDA approved for NASAL specimens in patients 98 years of age and older), is one component of a comprehensive surveillance program.  It is not intended to diagnose infection nor to guide or monitor treatment. Performed at [facility], [address] [place], [place] [address]   . ABO/RH(D) 05/19/2020 A POS   Final  . Antibody Screen 05/19/2020 NEG   Final  . Sample Expiration 05/19/2020 06/02/2020,2359   Final  . Extend sample reason 05/19/2020    Final                   Value:NO TRANSFUSIONS OR PREGNANCY IN THE PAST 3 MONTHS Performed at [facility], [address] [place], [place] [address]     ECG: Date: 04/03/2021 Time ECG obtained: 1411 PM Rate: 83 bpm Rhythm:  SR with SA Axis (leads I and aVF): Left axis deviation Intervals: PR 128 ms. QRS 68 ms. QTc 451 ms. ST segment and T wave changes: No evidence of acute ST segment elevation or depression Comparison: Similar to previous tracing obtained on 11/30/2019   IMAGING / PROCEDURES: ECHOCARDIOGRAM performed on 01/10/2020 1. Normal left ventricular chamber size.  2. Normal wall motion and systolic function.   3. The biplane LVEF is 63%.  4. Moderately enlarged right ventricle with normal systolic function.  5. The tricuspid valve is not well-visualized.  There is an eccentric jet of  tricuspid regurgitation directed towards the septum.  Visually, it only appears mild in severity but may be underestimated due to incomplete visualization.  6. The estimated right ventricular systolic pressure is 32 mmHg.  7. Inferior vena cava is not well visualized but appears normal in dimension.   8. The hepatic veins were not visualized.  9. When directly compared to the prior study from 01/11/2019, the TR appears similar in severity.   MRI HEAD WITHOUT CONTRAST performed on 12/27/2019 1. No acute intracranial process 2. Diffuse cerebral atrophy and chronic microvascular ischemic changes 3. Lateral and third ventricular dilatation at a proportion to cerebral volume loss and suspicious for normal pressure hydrocephalus consider dedicated CSF flow  study for further evaluation  MRI CERVICAL SPINE WITHOUT CONTRAST performed on 12/27/2019 1. Mild to moderate spinal canal and bilateral neuroforaminal narrowing at the C4-C5 and C5-C6 levels 2. Mild spinal canal and bilateral foraminal narrowing at C3-C4 3. Mild left C6-C7 neuroforaminal narrowing   Impression and Plan:  Martha Wilson has been referred for pre-anesthesia review and clearance prior to her undergoing the planned anesthetic and procedural courses. Available labs, pertinent testing, and imaging results were personally reviewed by me. This patient has been appropriately cleared by cardiology with an overall ACCEPTABLE risk of significant perioperative cardiovascular complications.  Based on clinical review performed today (05/22/20), barring any significant acute changes in the patient's overall condition, it is anticipated that she will be able to proceed with the planned surgical intervention.  Any acute changes in clinical condition may necessitate her procedure being postponed and/or cancelled. Pre-surgical instructions were reviewed with the patient during her PAT appointment and questions were fielded by PAT clinical staff.  Honor Loh, MSN, APRN, FNP-C, CEN [facility]  Peri-operative Services Nurse Practitioner Phone: [phone] 05/22/20 4:47 PM  NOTE: This note has been prepared using Dragon dictation software. Despite my best ability to proofread, there is always the potential that unintentional transcriptional errors may still occur from this process.

## 2020-05-29 ENCOUNTER — Other Ambulatory Visit: Payer: Self-pay

## 2020-05-29 ENCOUNTER — Other Ambulatory Visit
Admission: RE | Admit: 2020-05-29 | Discharge: 2020-05-29 | Disposition: A | Discharge status: Home / Self Care | Payer: Medicare HMO | Source: Ambulatory Visit | Attending: Neurosurgery | Admitting: Neurosurgery

## 2020-05-29 DIAGNOSIS — Z20822 Contact with and (suspected) exposure to covid-19: Secondary | ICD-10-CM | POA: Insufficient documentation

## 2020-05-29 DIAGNOSIS — Z01812 Encounter for preprocedural laboratory examination: Secondary | ICD-10-CM | POA: Insufficient documentation

## 2020-05-29 LAB — SARS CORONAVIRUS 2 (TAT 6-24 HRS): SARS Coronavirus 2: NEGATIVE

## 2020-05-31 ENCOUNTER — Inpatient Hospital Stay
Admission: RE | Admit: 2020-05-31 | Discharge: 2020-06-01 | DRG: 033 | Disposition: A | Discharge status: Home / Self Care | Payer: Medicare HMO | Attending: Neurosurgery | Admitting: Neurosurgery

## 2020-05-31 ENCOUNTER — Encounter: Payer: Self-pay | Admitting: Neurosurgery

## 2020-05-31 ENCOUNTER — Other Ambulatory Visit: Payer: Self-pay

## 2020-05-31 ENCOUNTER — Inpatient Hospital Stay: Payer: Medicare HMO

## 2020-05-31 ENCOUNTER — Encounter
Admission: RE | Disposition: A | Discharge status: Home / Self Care | Payer: Self-pay | Source: Home / Self Care | Attending: Neurosurgery

## 2020-05-31 ENCOUNTER — Inpatient Hospital Stay: Payer: Medicare HMO | Admitting: Urgent Care

## 2020-05-31 DIAGNOSIS — Z8349 Family history of other endocrine, nutritional and metabolic diseases: Secondary | ICD-10-CM | POA: Diagnosis not present

## 2020-05-31 DIAGNOSIS — Z79899 Other long term (current) drug therapy: Secondary | ICD-10-CM | POA: Diagnosis not present

## 2020-05-31 DIAGNOSIS — G912 (Idiopathic) normal pressure hydrocephalus: Secondary | ICD-10-CM | POA: Diagnosis present

## 2020-05-31 DIAGNOSIS — Z91048 Other nonmedicinal substance allergy status: Secondary | ICD-10-CM | POA: Diagnosis not present

## 2020-05-31 DIAGNOSIS — Z20822 Contact with and (suspected) exposure to covid-19: Secondary | ICD-10-CM | POA: Diagnosis present

## 2020-05-31 DIAGNOSIS — Z8249 Family history of ischemic heart disease and other diseases of the circulatory system: Secondary | ICD-10-CM | POA: Diagnosis not present

## 2020-05-31 DIAGNOSIS — Z982 Presence of cerebrospinal fluid drainage device: Secondary | ICD-10-CM

## 2020-05-31 DIAGNOSIS — Z91030 Bee allergy status: Secondary | ICD-10-CM | POA: Diagnosis not present

## 2020-05-31 DIAGNOSIS — F17210 Nicotine dependence, cigarettes, uncomplicated: Secondary | ICD-10-CM | POA: Diagnosis present

## 2020-05-31 DIAGNOSIS — Z880 Allergy status to penicillin: Secondary | ICD-10-CM | POA: Diagnosis not present

## 2020-05-31 DIAGNOSIS — E0590 Thyrotoxicosis, unspecified without thyrotoxic crisis or storm: Secondary | ICD-10-CM | POA: Diagnosis present

## 2020-05-31 HISTORY — PX: VENTRICULOPERITONEAL SHUNT: SHX204

## 2020-05-31 HISTORY — PX: APPLICATION OF CRANIAL NAVIGATION: SHX6578

## 2020-05-31 HISTORY — DX: Thyrotoxicosis, unspecified without thyrotoxic crisis or storm: E05.90

## 2020-05-31 HISTORY — DX: (Idiopathic) normal pressure hydrocephalus: G91.2

## 2020-05-31 LAB — GLUCOSE, CAPILLARY
Glucose-Capillary: 128 mg/dL — ABNORMAL HIGH (ref 70–99)
Glucose-Capillary: 141 mg/dL — ABNORMAL HIGH (ref 70–99)

## 2020-05-31 LAB — ABO/RH: ABO/RH(D): A POS

## 2020-05-31 IMAGING — DX DG SKULL 1-3V
2 series · 2 of 2 positions shown · non-contrast
Comparison: None.

CLINICAL DATA: Follow-up shunt insertion.

EXAM:
SKULL - 1-3 VIEW

[skull calldwell]
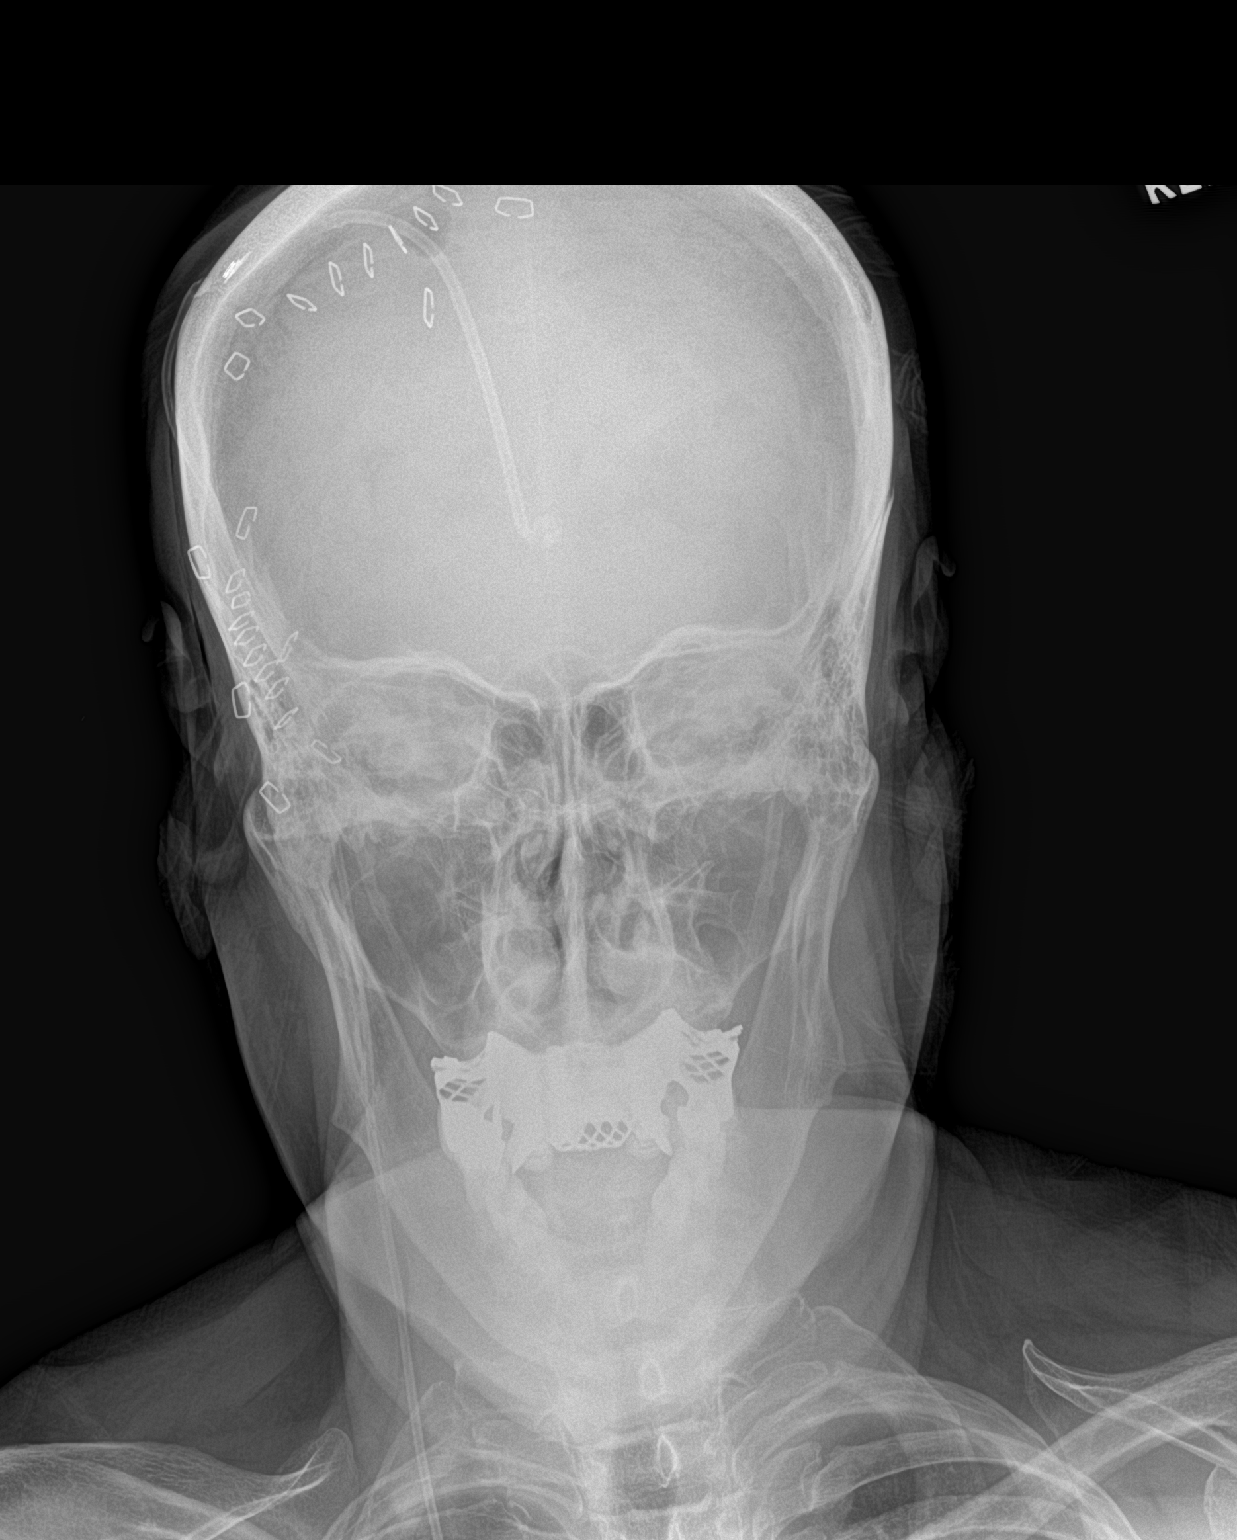

[skull lat]
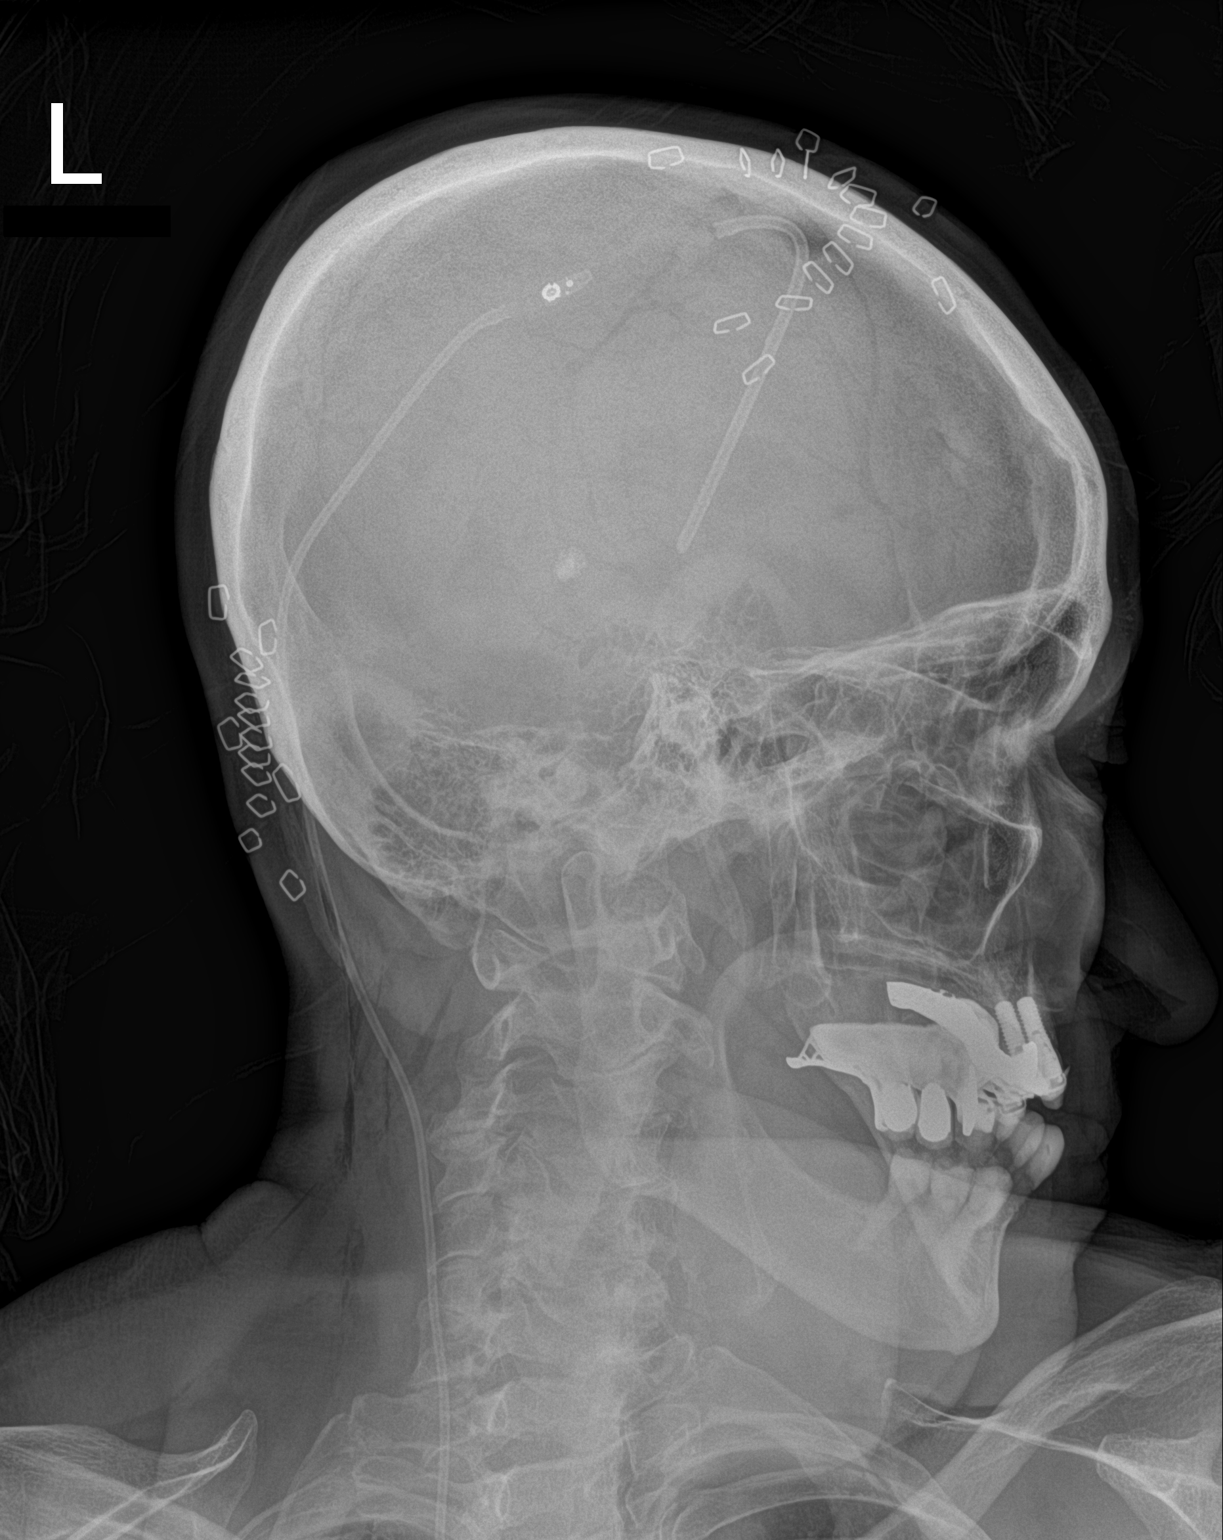

[2 of 2 positions shown; findings below may reference images not displayed]

FINDINGS: VP shunt placed from the right. Tip just to the right of midline. No
sign of shunt kink or discontinuity.
IMPRESSION: No sign of shunt kink or discontinuity.

## 2020-05-31 IMAGING — DX DG ABDOMEN 1V
1 series · 1 of 1 positions shown · non-contrast
Comparison: None.

CLINICAL DATA: VP shunt

EXAM:
ABDOMEN - 1 VIEW

[abdomen supine]
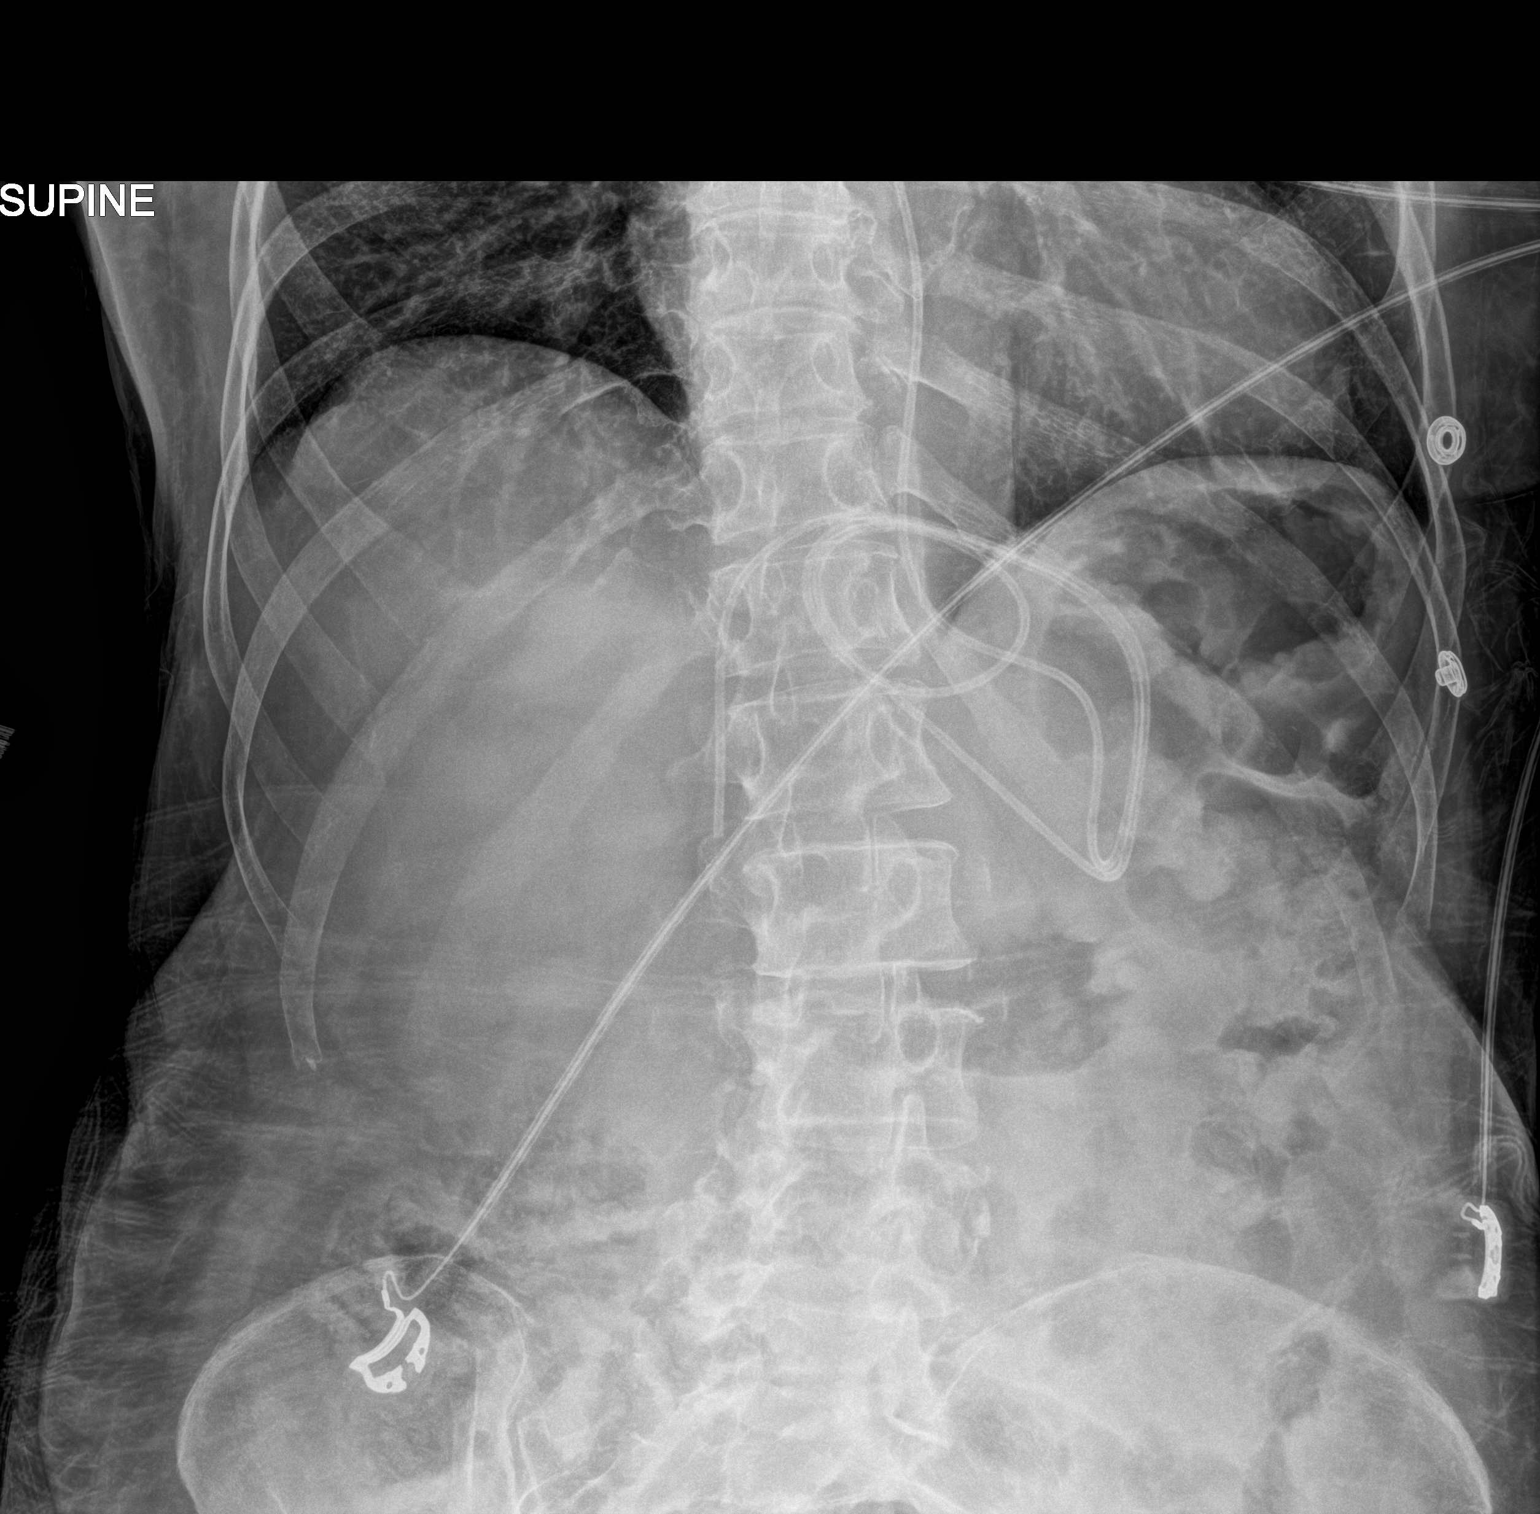

[1 of 1 positions shown; findings below may reference images not displayed]

FINDINGS: Shunt to reaches the abdomen and shows multiple loops in the upper
central midline. No evidence of kink or discontinuity.
IMPRESSION: No kink or discontinuity. Shunt loops in the upper central midline.

## 2020-05-31 IMAGING — DX DG NECK SOFT TISSUE
1 series · 1 of 1 positions shown · non-contrast
Comparison: None.

CLINICAL DATA: Shunt placement

EXAM:
NECK SOFT TISSUES - 1+ VIEW

[neck ap]
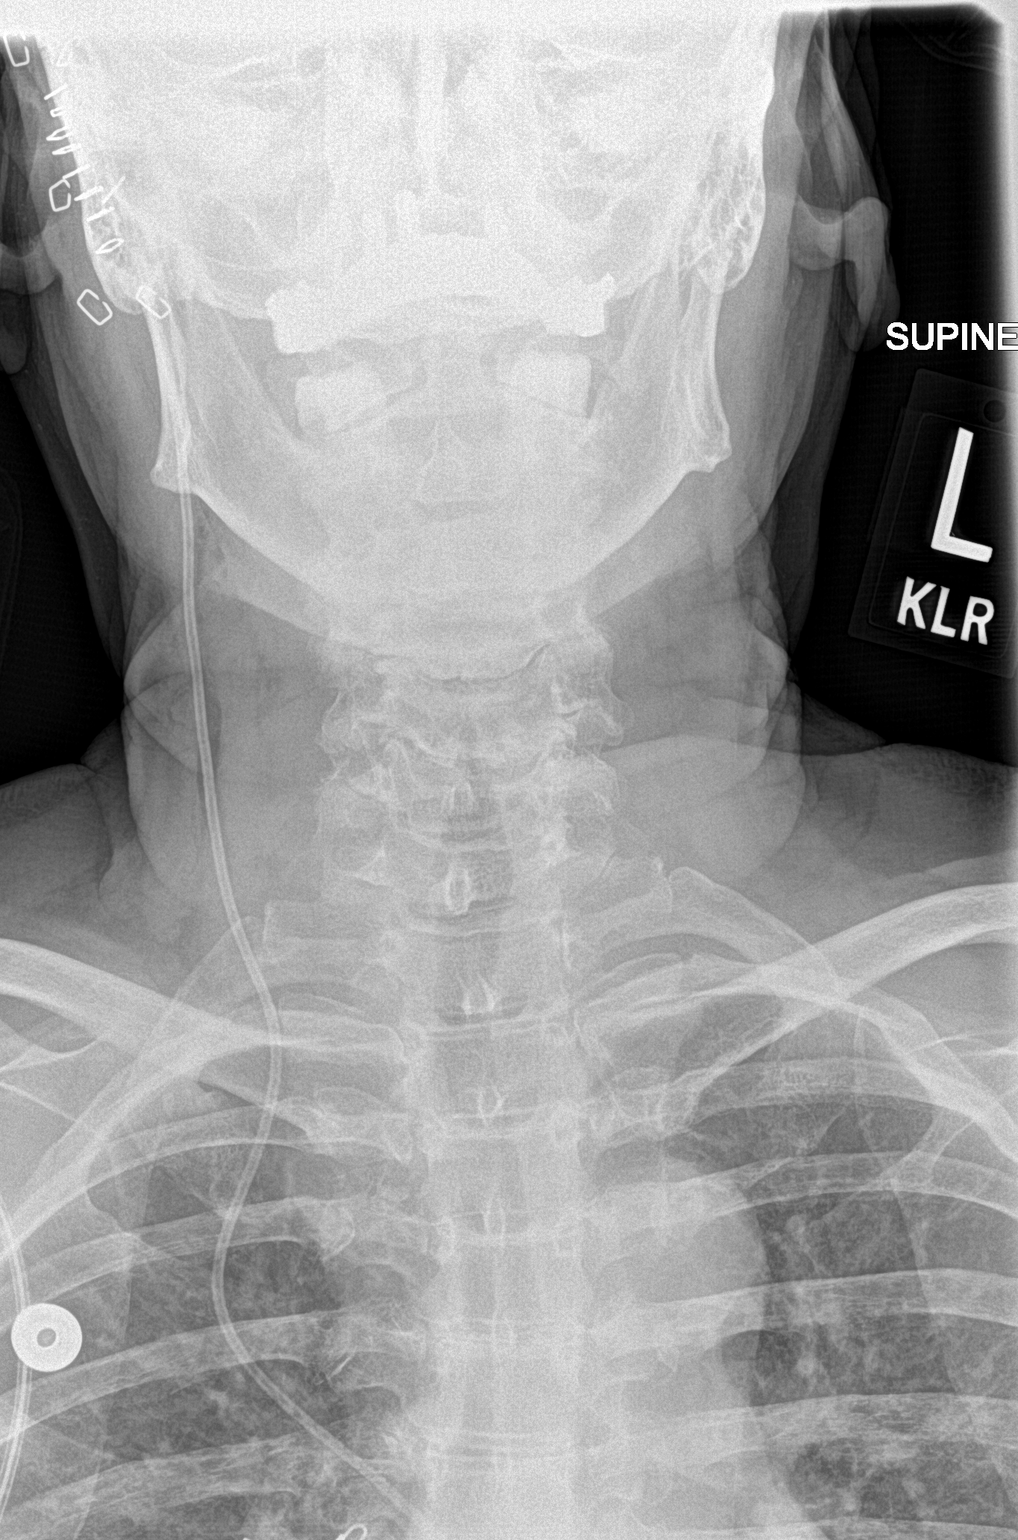

[1 of 1 positions shown; findings below may reference images not displayed]

FINDINGS: Single view shows the shunt traversing the right neck without kink
or discontinuity.
IMPRESSION: Shunt traversing the right neck without kink or discontinuity.

## 2020-05-31 IMAGING — DX DG CHEST 1V
1 series · 1 of 1 positions shown · non-contrast
Comparison: None.

CLINICAL DATA: VP shunt placement.

EXAM:
CHEST  1 VIEW

[chest ap]
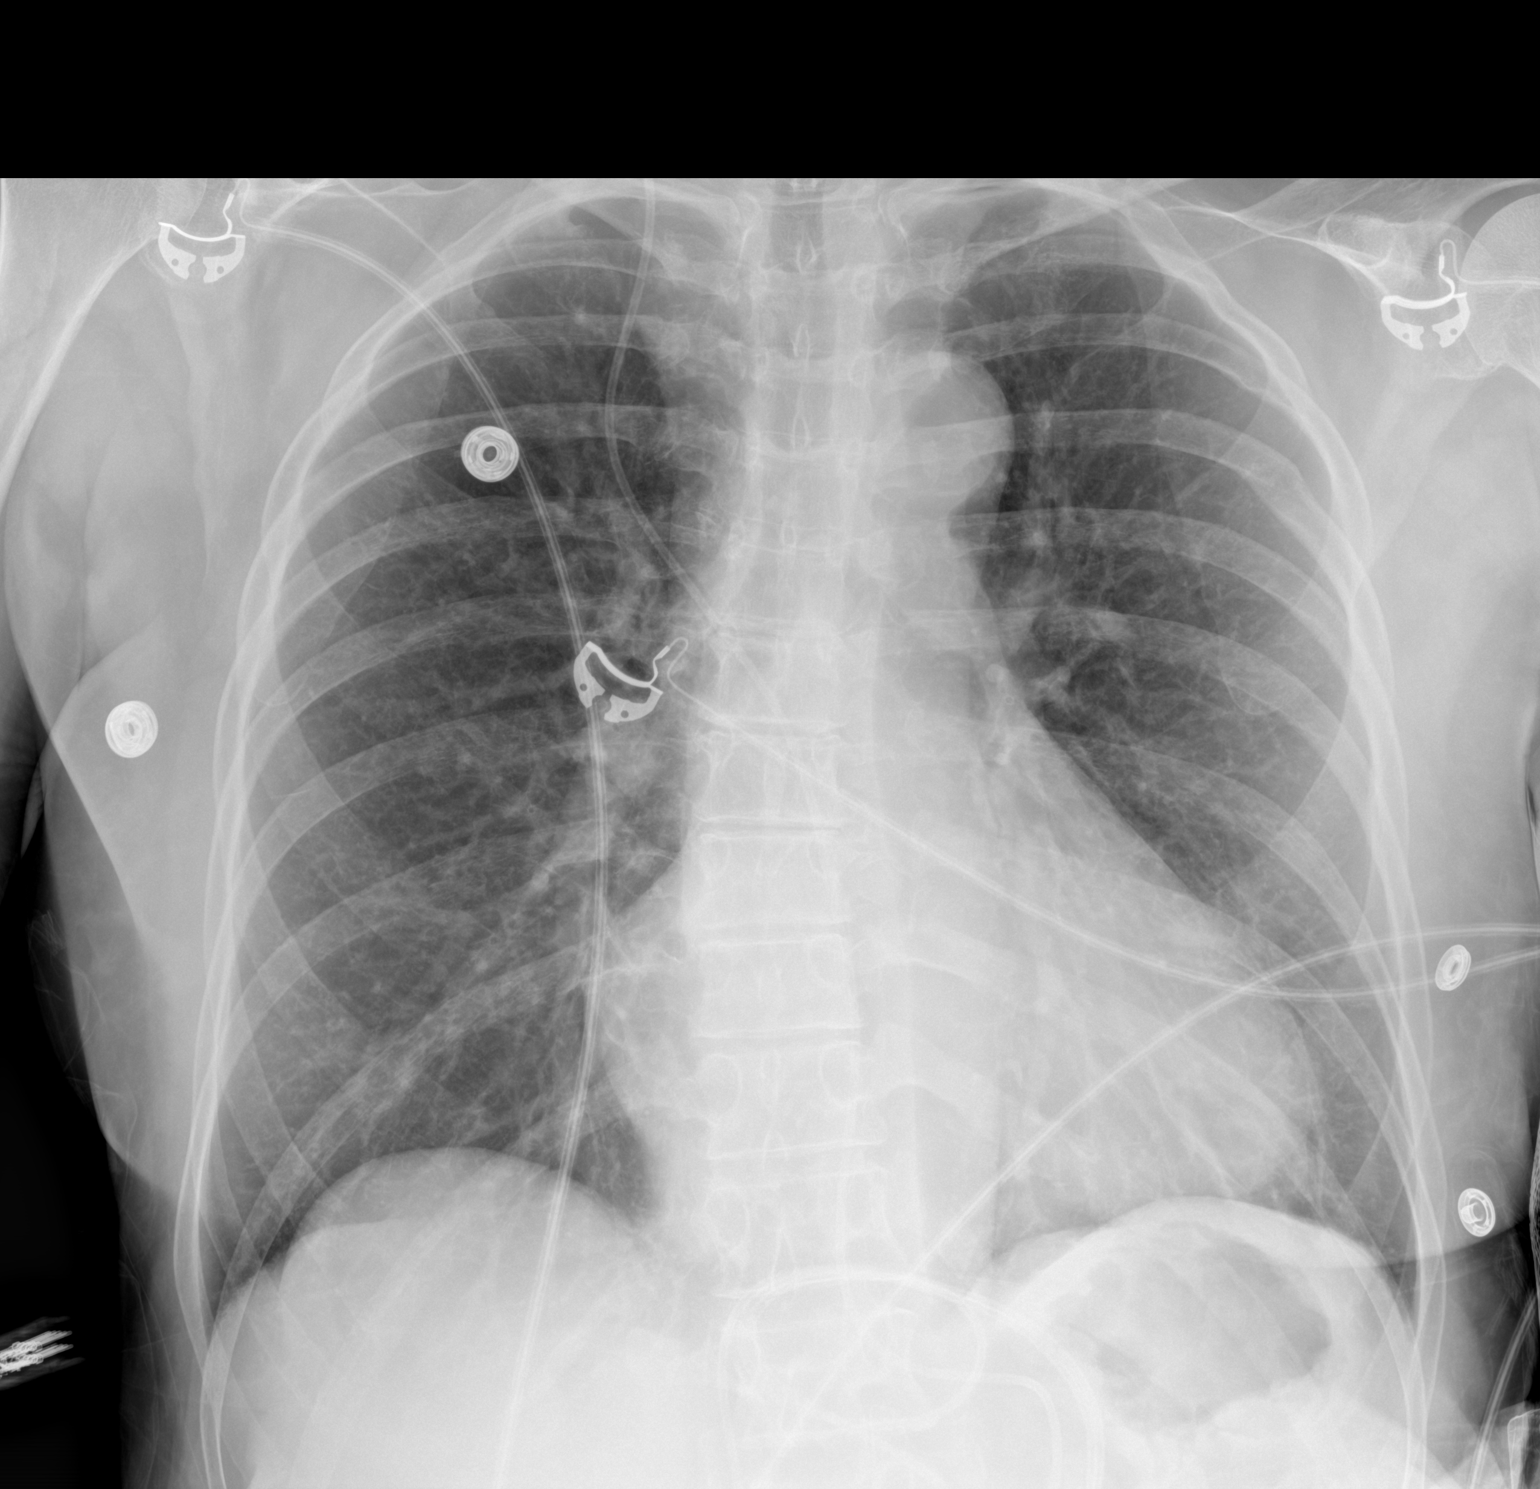

[1 of 1 positions shown; findings below may reference images not displayed]

FINDINGS: Shunt tube overlies the right upper chest and midline lower chest.
No kink or discontinuity in the chest region.
IMPRESSION: No kink or discontinuity in the chest region.

## 2020-05-31 SURGERY — SHUNT INSERTION VENTRICULAR-PERITONEAL
Anesthesia: General | Laterality: Right

## 2020-05-31 MED ORDER — ONDANSETRON HCL 4 MG/2ML IJ SOLN
INTRAMUSCULAR | Status: AC
  Filled 2020-05-31: qty 2

## 2020-05-31 MED ORDER — DEXAMETHASONE SODIUM PHOSPHATE 10 MG/ML IJ SOLN
INTRAMUSCULAR | Status: AC
  Filled 2020-05-31: qty 1

## 2020-05-31 MED ORDER — FENTANYL CITRATE (PF) 100 MCG/2ML IJ SOLN
INTRAMUSCULAR | Status: AC
  Filled 2020-05-31: qty 2

## 2020-05-31 MED ORDER — ONDANSETRON HCL 4 MG/2ML IJ SOLN: 4.0000 mg | INTRAMUSCULAR | Status: DC | PRN

## 2020-05-31 MED ORDER — SENNOSIDES-DOCUSATE SODIUM 8.6-50 MG PO TABS: 1.0000 | ORAL_TABLET | Freq: Every evening | ORAL | Status: DC | PRN

## 2020-05-31 MED ORDER — CHLORHEXIDINE GLUCONATE 0.12 % MT SOLN
OROMUCOSAL | Status: AC
  Administered 2020-05-31: 15 mL via OROMUCOSAL
  Filled 2020-05-31: qty 15

## 2020-05-31 MED ORDER — FENTANYL CITRATE (PF) 100 MCG/2ML IJ SOLN: 25.0000 ug | INTRAMUSCULAR | Status: DC | PRN

## 2020-05-31 MED ORDER — ROCURONIUM BROMIDE 10 MG/ML (PF) SYRINGE
PREFILLED_SYRINGE | INTRAVENOUS | Status: AC
  Filled 2020-05-31: qty 10

## 2020-05-31 MED ORDER — POTASSIUM CHLORIDE IN NACL 20-0.9 MEQ/L-% IV SOLN
INTRAVENOUS | Status: DC
  Filled 2020-05-31 (×3): qty 1000

## 2020-05-31 MED ORDER — ACETAMINOPHEN 10 MG/ML IV SOLN
INTRAVENOUS | Status: AC
  Filled 2020-05-31: qty 100

## 2020-05-31 MED ORDER — LIDOCAINE HCL (PF) 2 % IJ SOLN
INTRAMUSCULAR | Status: AC
  Filled 2020-05-31: qty 5

## 2020-05-31 MED ORDER — SUGAMMADEX SODIUM 200 MG/2ML IV SOLN
INTRAVENOUS | Status: DC | PRN
  Administered 2020-05-31: 400 mg via INTRAVENOUS

## 2020-05-31 MED ORDER — FENTANYL CITRATE (PF) 100 MCG/2ML IJ SOLN
INTRAMUSCULAR | Status: DC | PRN
  Administered 2020-05-31 (×3): 50 ug via INTRAVENOUS
  Administered 2020-05-31 (×2): 25 ug via INTRAVENOUS

## 2020-05-31 MED ORDER — ACETAMINOPHEN 650 MG RE SUPP: 650.0000 mg | RECTAL | Status: DC | PRN

## 2020-05-31 MED ORDER — PROPOFOL 10 MG/ML IV BOLUS
INTRAVENOUS | Status: DC | PRN
  Administered 2020-05-31: 40 mg via INTRAVENOUS
  Administered 2020-05-31: 150 mg via INTRAVENOUS
  Administered 2020-05-31: 40 mg via INTRAVENOUS

## 2020-05-31 MED ORDER — LIDOCAINE-EPINEPHRINE 1 %-1:100000 IJ SOLN
INTRAMUSCULAR | Status: DC | PRN
  Administered 2020-05-31: 9 mL

## 2020-05-31 MED ORDER — FAMOTIDINE 20 MG PO TABS: 20.0000 mg | ORAL_TABLET | Freq: Once | ORAL | Status: AC

## 2020-05-31 MED ORDER — TURMERIC 500 MG PO CAPS: 500.0000 mg | ORAL_CAPSULE | Freq: Every day | ORAL | Status: DC

## 2020-05-31 MED ORDER — VANCOMYCIN HCL IN DEXTROSE 1-5 GM/200ML-% IV SOLN: 1000.0000 mg | Freq: Once | INTRAVENOUS | Status: AC

## 2020-05-31 MED ORDER — OXYCODONE HCL 5 MG/5ML PO SOLN: 5.0000 mg | Freq: Once | ORAL | Status: DC | PRN

## 2020-05-31 MED ORDER — VITAMIN B-6 50 MG PO TABS
100.0000 mg | ORAL_TABLET | Freq: Every day | ORAL | Status: DC
  Administered 2020-06-01: 100 mg via ORAL
  Filled 2020-05-31: qty 2

## 2020-05-31 MED ORDER — GELATIN ABSORBABLE 12-7 MM EX MISC
CUTANEOUS | Status: DC | PRN
  Administered 2020-05-31: 1 via TOPICAL

## 2020-05-31 MED ORDER — ONDANSETRON HCL 4 MG PO TABS: 4.0000 mg | ORAL_TABLET | ORAL | Status: DC | PRN

## 2020-05-31 MED ORDER — DEXAMETHASONE SODIUM PHOSPHATE 10 MG/ML IJ SOLN
INTRAMUSCULAR | Status: DC | PRN
  Administered 2020-05-31: 10 mg via INTRAVENOUS

## 2020-05-31 MED ORDER — ACETAMINOPHEN 10 MG/ML IV SOLN
INTRAVENOUS | Status: DC | PRN
  Administered 2020-05-31: 1000 mg via INTRAVENOUS

## 2020-05-31 MED ORDER — ACETAMINOPHEN 500 MG PO TABS
1000.0000 mg | ORAL_TABLET | Freq: Four times a day (QID) | ORAL | Status: DC | PRN
  Administered 2020-06-01: 1000 mg via ORAL
  Filled 2020-05-31: qty 2

## 2020-05-31 MED ORDER — HYDROCODONE-ACETAMINOPHEN 5-325 MG PO TABS
1.0000 | ORAL_TABLET | ORAL | Status: DC | PRN
  Administered 2020-05-31 – 2020-06-01 (×4): 1 via ORAL
  Filled 2020-05-31 (×5): qty 1

## 2020-05-31 MED ORDER — MAGNESIUM 400 MG PO CAPS: 400.0000 mg | ORAL_CAPSULE | Freq: Every day | ORAL | Status: DC

## 2020-05-31 MED ORDER — LACTATED RINGERS IV SOLN: INTRAVENOUS | Status: DC

## 2020-05-31 MED ORDER — PRESERVISION AREDS 2 PO CAPS: 1.0000 | ORAL_CAPSULE | Freq: Two times a day (BID) | ORAL | Status: DC

## 2020-05-31 MED ORDER — METHIMAZOLE 5 MG PO TABS
2.5000 mg | ORAL_TABLET | Freq: Every day | ORAL | Status: DC
  Administered 2020-05-31: 2.5 mg via ORAL
  Filled 2020-05-31 (×2): qty 1

## 2020-05-31 MED ORDER — OXYCODONE HCL 5 MG PO TABS: 5.0000 mg | ORAL_TABLET | Freq: Once | ORAL | Status: DC | PRN

## 2020-05-31 MED ORDER — DEXMEDETOMIDINE (PRECEDEX) IN NS 20 MCG/5ML (4 MCG/ML) IV SYRINGE
PREFILLED_SYRINGE | INTRAVENOUS | Status: DC | PRN
  Administered 2020-05-31: 8 ug via INTRAVENOUS

## 2020-05-31 MED ORDER — CHLORHEXIDINE GLUCONATE CLOTH 2 % EX PADS: 6.0000 | MEDICATED_PAD | Freq: Every day | CUTANEOUS | Status: DC

## 2020-05-31 MED ORDER — THROMBIN 5000 UNITS EX SOLR
CUTANEOUS | Status: DC | PRN
  Administered 2020-05-31: 5000 [IU] via TOPICAL

## 2020-05-31 MED ORDER — ONDANSETRON HCL 4 MG/2ML IJ SOLN
INTRAMUSCULAR | Status: DC | PRN
  Administered 2020-05-31 (×2): 4 mg via INTRAVENOUS

## 2020-05-31 MED ORDER — OCUVITE-LUTEIN PO CAPS
1.0000 | ORAL_CAPSULE | Freq: Two times a day (BID) | ORAL | Status: DC
  Administered 2020-05-31 – 2020-06-01 (×2): 1 via ORAL
  Filled 2020-05-31 (×3): qty 1

## 2020-05-31 MED ORDER — ORAL CARE MOUTH RINSE: 15.0000 mL | Freq: Once | OROMUCOSAL | Status: AC

## 2020-05-31 MED ORDER — MEPERIDINE HCL 50 MG/ML IJ SOLN: 6.2500 mg | INTRAMUSCULAR | Status: DC | PRN

## 2020-05-31 MED ORDER — MAGNESIUM OXIDE 400 (241.3 MG) MG PO TABS
400.0000 mg | ORAL_TABLET | Freq: Every day | ORAL | Status: DC
  Administered 2020-06-01: 400 mg via ORAL
  Filled 2020-05-31: qty 1

## 2020-05-31 MED ORDER — ACETAMINOPHEN 325 MG PO TABS
650.0000 mg | ORAL_TABLET | ORAL | Status: DC | PRN
  Administered 2020-05-31: 650 mg via ORAL
  Filled 2020-05-31: qty 2

## 2020-05-31 MED ORDER — FAMOTIDINE 20 MG PO TABS
ORAL_TABLET | ORAL | Status: AC
  Administered 2020-05-31: 20 mg via ORAL
  Filled 2020-05-31: qty 1

## 2020-05-31 MED ORDER — PROMETHAZINE HCL 25 MG/ML IJ SOLN: 6.2500 mg | INTRAMUSCULAR | Status: DC | PRN

## 2020-05-31 MED ORDER — VITAMIN B-12 1000 MCG PO TABS
2500.0000 ug | ORAL_TABLET | Freq: Every day | ORAL | Status: DC
  Administered 2020-06-01: 2500 ug via ORAL
  Filled 2020-05-31: qty 3

## 2020-05-31 MED ORDER — CHLORHEXIDINE GLUCONATE 0.12 % MT SOLN: 15.0000 mL | Freq: Once | OROMUCOSAL | Status: AC

## 2020-05-31 MED ORDER — PROMETHAZINE HCL 25 MG PO TABS
12.5000 mg | ORAL_TABLET | ORAL | Status: DC | PRN
  Filled 2020-05-31: qty 1

## 2020-05-31 MED ORDER — ROCURONIUM BROMIDE 100 MG/10ML IV SOLN
INTRAVENOUS | Status: DC | PRN
  Administered 2020-05-31: 20 mg via INTRAVENOUS
  Administered 2020-05-31: 10 mg via INTRAVENOUS
  Administered 2020-05-31: 40 mg via INTRAVENOUS
  Administered 2020-05-31: 10 mg via INTRAVENOUS
  Administered 2020-05-31: 20 mg via INTRAVENOUS

## 2020-05-31 MED ORDER — VANCOMYCIN HCL IN DEXTROSE 1-5 GM/200ML-% IV SOLN
INTRAVENOUS | Status: AC
  Administered 2020-05-31: 1000 mg via INTRAVENOUS
  Filled 2020-05-31: qty 200

## 2020-05-31 MED ORDER — LIDOCAINE HCL (CARDIAC) PF 100 MG/5ML IV SOSY
PREFILLED_SYRINGE | INTRAVENOUS | Status: DC | PRN
  Administered 2020-05-31: 50 mg via INTRAVENOUS

## 2020-05-31 MED ORDER — LABETALOL HCL 5 MG/ML IV SOLN
10.0000 mg | INTRAVENOUS | Status: DC | PRN
Start: 2020-05-31 — End: 2020-06-01

## 2020-05-31 SURGICAL SUPPLY — 68 items
BLADE CLIPPER SURG (BLADE) ×3 IMPLANT
BLADE SURG 15 STRL LF DISP TIS (BLADE) ×2 IMPLANT
BLADE SURG 15 STRL SS (BLADE) ×1
BOOT SUTURE AID YELLOW STND (SUTURE) ×3 IMPLANT
BUR ACORN 7.5 PRECISION (BURR) ×3 IMPLANT
CATH VENT BACTISEAL SHUNT SYS (SHUNT) ×3 IMPLANT
CHLORAPREP W/TINT 26 (MISCELLANEOUS) ×9 IMPLANT
COTTON BALL STRL MEDIUM (GAUZE/BANDAGES/DRESSINGS) ×6 IMPLANT
COUNTER NEEDLE 20/40 LG (NEEDLE) ×3 IMPLANT
COVER WAND RF STERILE (DRAPES) ×3 IMPLANT
DERMABOND ADVANCED (GAUZE/BANDAGES/DRESSINGS) ×1
DERMABOND ADVANCED .7 DNX12 (GAUZE/BANDAGES/DRESSINGS) ×2 IMPLANT
DRAPE LAPAROTOMY 100X77 ABD (DRAPES) ×3 IMPLANT
DRAPE POUCH INSTRU U-SHP 10X18 (DRAPES) ×6 IMPLANT
DRAPE SURG 17X11 SM STRL (DRAPES) ×30 IMPLANT
DRAPE SURG IRRIG POUCH 19X23 (DRAPES) ×3 IMPLANT
DRAPE WARM FLUID 44X44 (DRAPES) IMPLANT
DRSG TEGADERM 4X4.75 (GAUZE/BANDAGES/DRESSINGS) ×6 IMPLANT
DRSG TELFA 3X8 NADH (GAUZE/BANDAGES/DRESSINGS) ×3 IMPLANT
DRSG TELFA 4X3 1S NADH ST (GAUZE/BANDAGES/DRESSINGS) ×3 IMPLANT
ELECT CAUTERY BLADE TIP 2.5 (TIP) ×3
ELECT COATED BLADE 2.86 ST (ELECTRODE) ×3 IMPLANT
ELECT REM PT RETURN 9FT ADLT (ELECTROSURGICAL) ×3
ELECTRODE CAUTERY BLDE TIP 2.5 (TIP) ×2 IMPLANT
ELECTRODE REM PT RTRN 9FT ADLT (ELECTROSURGICAL) ×2 IMPLANT
GLOVE SURG SYN 8.5  E (GLOVE) ×3
GLOVE SURG SYN 8.5 E (GLOVE) ×6 IMPLANT
GOWN SRG XL LVL 3 NONREINFORCE (GOWNS) ×2 IMPLANT
GOWN STRL NON-REIN TWL XL LVL3 (GOWNS) ×1
GRADUATE 1200CC STRL 31836 (MISCELLANEOUS) ×6 IMPLANT
HEMOSTAT SURGICEL 2X14 (HEMOSTASIS) ×3 IMPLANT
KIT TURNOVER KIT A (KITS) ×3 IMPLANT
L-HOOK LAP DISP 36CM (ELECTROSURGICAL) ×3
LHOOK LAP DISP 36CM (ELECTROSURGICAL) ×2 IMPLANT
MANIFOLD NEPTUNE II (INSTRUMENTS) ×3 IMPLANT
MARKER SKIN DUAL TIP RULER LAB (MISCELLANEOUS) ×6 IMPLANT
NDL INSUFF ACCESS 14 VERSASTEP (NEEDLE) ×3 IMPLANT
NEEDLE INSUFFLATION 14GA 120MM (NEEDLE) ×3 IMPLANT
NS IRRIG 1000ML POUR BTL (IV SOLUTION) ×6 IMPLANT
PACK CRANIOTOMY CUSTOM (CUSTOM PROCEDURE TRAY) ×3 IMPLANT
PACK LAMINECTOMY NEURO (CUSTOM PROCEDURE TRAY) ×3 IMPLANT
PAD ARMBOARD 7.5X6 YLW CONV (MISCELLANEOUS) ×6 IMPLANT
PAD PREP 24X41 OB/GYN DISP (PERSONAL CARE ITEMS) ×3 IMPLANT
PASSER CATH 65CM DISP (NEUROSURGERY SUPPLIES) IMPLANT
PASSER CATH SHUNT 55CM (INSTRUMENTS) ×6 IMPLANT
SET TUBE SMOKE EVAC HIGH FLOW (TUBING) ×3 IMPLANT
SHEATH PERITONEAL INTRO 61 (SHEATH) ×6 IMPLANT
SHEET NEURO XL SOL CTL (MISCELLANEOUS) ×3 IMPLANT
SLEEVE ENDOPATH XCEL 5M (ENDOMECHANICALS) ×3 IMPLANT
STAPLER SKIN PROX 35W (STAPLE) ×15 IMPLANT
STYLET DISP PRECALIBRATE (MISCELLANEOUS) ×3 IMPLANT
SURGIFLO W/THROMBIN 8M KIT (HEMOSTASIS) ×3 IMPLANT
SUT MNCRL 4-0 (SUTURE) ×2
SUT MNCRL 4-0 27XMFL (SUTURE) ×4
SUT MNCRL AB 3-0 PS2 27 (SUTURE) ×3 IMPLANT
SUT POLYSORB 3-0 18 V-20 (SUTURE) ×9 IMPLANT
SUT SILK 2 0 (SUTURE) ×1
SUT SILK 2 0SH CR/8 30 (SUTURE) ×6 IMPLANT
SUT SILK 2-0 18XBRD TIE 12 (SUTURE) ×2 IMPLANT
SUT VICRYL 2-0 SH 8X27 (SUTURE) ×9 IMPLANT
SUTURE MNCRL 4-0 27XMF (SUTURE) ×4 IMPLANT
TAPE CLOTH 3X10 WHT NS LF (GAUZE/BANDAGES/DRESSINGS) ×6 IMPLANT
TOWEL OR 17X26 4PK STRL BLUE (TOWEL DISPOSABLE) ×9 IMPLANT
TRAY FOLEY MTR SLVR 16FR STAT (SET/KITS/TRAYS/PACK) IMPLANT
TROCAR XCEL NON-BLD 5MMX100MML (ENDOMECHANICALS) ×3 IMPLANT
TUBING CONNECTING 10 (TUBING) ×3 IMPLANT
VALVE PROGRAM HAKIM  SYS (Valve) ×1 IMPLANT
VALVE PROGRAM HAKIM SYS (Valve) ×2 IMPLANT

## 2020-05-31 NOTE — Progress Notes (Signed)
Brief Pharmacy Note  Consult for vancomycin for surgical prophylaxis. Vancomycin 1 g IV to be given within 1 hour of procedure. Order entered.  Dorena Bodo, PharmD

## 2020-05-31 NOTE — Anesthesia Preprocedure Evaluation (Signed)
Anesthesia Evaluation  Patient identified by MRN, date of birth, ID band Patient awake    Reviewed: Allergy & Precautions, NPO status , Patient's Chart, lab work & pertinent test results  History of Anesthesia Complications Negative for: history of anesthetic complications  Airway Mallampati: II  TM Distance: >3 FB Neck ROM: Full    Dental  (+) Partial Lower, Partial Upper   Pulmonary neg sleep apnea, neg COPD, Current Smoker and Patient abstained from smoking.,    breath sounds clear to auscultation- rhonchi (-) wheezing      Cardiovascular Exercise Tolerance: Good (-) hypertension(-) CAD, (-) Past MI, (-) Cardiac Stents and (-) CABG  Rhythm:Regular Rate:Normal - Systolic murmurs and - Diastolic murmurs    Neuro/Psych  Headaches, Balance/gait problems negative psych ROS   GI/Hepatic negative GI ROS, Neg liver ROS,   Endo/Other  neg diabetesHyperthyroidism   Renal/GU negative Renal ROS     Musculoskeletal  (+) Arthritis ,   Abdominal (+) - obese,   Peds  Hematology negative hematology ROS (+)   Anesthesia Other Findings Past Medical History: No date: Allergies No date: Arthritis     Comment:  right knee No date: Headache No date: History of methicillin resistant staphylococcus aureus (MRSA) No date: Hyperthyroidism No date: MVP (mitral valve prolapse) No date: Normal pressure hydrocephalus (HCC) No date: Osteopenia No date: Palpitations     Comment:  "stress related" No date: Skin cancer     Comment:  BASAL CELL No date: Tricuspid valve prolapse No date: Wears dentures     Comment:  partial upper and lower   Reproductive/Obstetrics                             Anesthesia Physical Anesthesia Plan  ASA: III  Anesthesia Plan: General   Post-op Pain Management:    Induction: Intravenous  PONV Risk Score and Plan: 1 and Ondansetron and Dexamethasone  Airway Management  Planned: Oral ETT  Additional Equipment:   Intra-op Plan:   Post-operative Plan: Extubation in OR  Informed Consent: I have reviewed the patients History and Physical, chart, labs and discussed the procedure including the risks, benefits and alternatives for the proposed anesthesia with the patient or authorized representative who has indicated his/her understanding and acceptance.     Dental advisory given  Plan Discussed with: CRNA and Anesthesiologist  Anesthesia Plan Comments:         Anesthesia Quick Evaluation

## 2020-05-31 NOTE — Anesthesia Procedure Notes (Signed)
Procedure Name: Intubation Date/Time: 05/31/2020 1:00 PM Performed by: Lerry Liner, CRNA Pre-anesthesia Checklist: Patient identified, Emergency Drugs available, Suction available and Patient being monitored Patient Re-evaluated:Patient Re-evaluated prior to induction Oxygen Delivery Method: Circle system utilized Preoxygenation: Pre-oxygenation with 100% oxygen Induction Type: IV induction Ventilation: Mask ventilation without difficulty Laryngoscope Size: McGraph and 3 Grade View: Grade I Tube type: Oral Tube size: 7.0 mm Number of attempts: 1 Airway Equipment and Method: Stylet and Oral airway Placement Confirmation: ETT inserted through vocal cords under direct vision,  positive ETCO2 and breath sounds checked- equal and bilateral Secured at: 21 cm Tube secured with: Tape Dental Injury: Teeth and Oropharynx as per pre-operative assessment

## 2020-05-31 NOTE — Op Note (Addendum)
Preoperative diagnosis: Normal pressure hydrocephalus  Postoperative diagnosis: Same Procedure: Laparoscopic guided ventriculoperitoneal shunt insertion   Anesthesia: GETA  Co-Surgeon: Elayne Guerin and [employer]  Wound Classification: clean   Specimen: None  Complications: None  Estimated Blood Loss: 3 mL   Indications: Patient is a 70 y.o. female  presented with above.  Neurosurgery requested assistance from general surgery in placing the shunt in the peritoneal cavity using minimally invasive technique.   Findings: 1.  Normal intra-abdominal anatomy  2.  Successful placement of ventriculoperitoneal catheter with confirmation of CSF fluid draining within the peritoneal cavity  Description of procedure: The patient was prepped and draped by the neurosurgeon.  Timeout was performed indicating correct procedure.  Below is the description of the intra-abdominal component of the procedure.  Please see neurosurgeon's note for additional information regarding the rest of the procedure.  5 mm skin incision made at Palmer's point and Veress needle inserted and after confirming and feeling 2 clicks, saline drop test was performed to confirm needle tip within the peritoneal cavity.  Gas insufflation started and confirmed no sharp increase in flow pressure, and abdominal cavity insufflated up to 15 mm mercury.  Needle removed and 5 mm trocar placed through the same incision via the Optiview technique.  Reinsertion of the camera through the port site noted no injury to the surrounding structures upon insertion of the port.  Inspection of the abdominal cavity noted no adhesions or additional anatomical abnormalities.  Point was chosen in the right lower quadrant for an additional 5 mm port, which was placed under direct vision after infusing the area with local anesthesia.    Attention was then turned to the end of the tunneling site in the subxiphoid area where the catheter tip was present.   Tip was grasped with Kellys and the abdominal cavity was entered through the subxiphoid incision site under direct visualization.  Tip of the shunt was then grasped with laparoscopic graspers and the majority of the shunt was placed within the abdominal cavity until no longer visible outside the skin incisions.  CSF fluid was noted to be freely flowing through the shunt within the abdominal cavity.  The shunt was placed atop the liver and the right lower quadrant port was removed under direct visualization.  No evidence of active bleeding after port removal. Abdomen then desufflates.  The subxiphoid, Palmer's point, and right lower quadrant incisions were then closed using interrupted 4-0 Monocryl, dressed with Dermabond.

## 2020-05-31 NOTE — Transfer of Care (Signed)
Immediate Anesthesia Transfer of Care Note  Patient: Martha Wilson  Procedure(s) Performed: SHUNT INSERTION VENTRICULAR-PERITONEAL (Right ) APPLICATION OF CRANIAL NAVIGATION (N/A )  Patient Location: PACU  Anesthesia Type:General  Level of Consciousness: drowsy  Airway & Oxygen Therapy: Patient Spontanous Breathing and Patient connected to face mask oxygen  Post-op Assessment: Report given to RN  Post vital signs: stable  Last Vitals:  Vitals Value Taken Time  BP 133/91 05/31/20 1506  Temp    Pulse 65 05/31/20 1508  Resp 20 05/31/20 1508  SpO2 100 % 05/31/20 1508  Vitals shown include unvalidated device data.  Last Pain:  Vitals:   05/31/20 1114  TempSrc: Temporal  PainSc: 0-No pain         Complications: No complications documented.

## 2020-05-31 NOTE — H&P (Signed)
I have reviewed and confirmed my history and physical from 05/03/2020 with no additions or changes. Plan for ventriculoperitoneal shunt placement.  Risks and benefits reviewed.  Heart sounds normal no MRG. Chest Clear to Auscultation Bilaterally.

## 2020-05-31 NOTE — Op Note (Signed)
Indications: [name] is a 70 yo female who presented with normal pressure hydrocephalus.  She had a successful lumbar drain trial, and opted for surgical intervention.  Findings: successfully draining ventriculoperitoneal shunt  Preoperative Diagnosis: Normal Pressure Hydrocephalus Postoperative Diagnosis: same   EBL: 25 ml IVF: 700 ml Drains: none Disposition: Extubated and Stable to PACU Complications: none  No foley catheter was placed.   Preoperative Note:   Risks of surgery discussed include: infection, bleeding, stroke, coma, death, paralysis, CSF leak, nerve/spinal cord injury, numbness, tingling, weakness, complex regional pain syndrome, recurrent stenosis and/or disc herniation, vascular injury, development of instability, neck/back pain, need for further surgery, persistent symptoms, development of deformity, and the risks of anesthesia. The patient understood these risks and agreed to proceed.  Operative Note:   1) R frontal ventriculoperitoneal shunt placement using cranial navigation.  The patient was then brought from the preoperative center with intravenous access established.  The patient underwent general anesthesia and endotracheal tube intubation, then was positioned on the regular bed.  The head was clipped, and she was positioned with a bump under the right shoulder.  The stereotactic imaging system was registered to the patient.    The head and belly were prepped.  The drapes were placed.  A semilunar was planned.  The incision was injected with local anesthetic after timeout was performed.  The cranial incision was opened.  The pericranium was exposed.  The skull was exposed at the entry site.  The drill was used to make a burr hole.  A pocket was made in the subgaleal plane.  The peritoneal tubing was attached to the [facility] shunt valve set at 160.  The tunneler was used to connect to a position behind the ear.  A passing incision was made in this  location, then the shunt apparatus was tunneled to this location.  The tunneler was then used to pass underneath the skin to the subxyphoid area.  A linear incision was made, then the shunt tubing was passed to this incision.  The valve was placed into the pocket.  At this point, the navigation system was used to placed the bactiseal ventricular catheter into the right frontal horn.  Brisk clear CSF flow was noted.  The catheter was attached to the valve and secured, then the area was irrigated.  Dr. Lysle Pearl acted as co-surgeon for placement of the tubing into the abdomen laparscopically.    The flow was confirmed, then we moved to closure.  The cranial incisions were closed with 2-0 vicryl and staples.  Dr. Lysle Pearl closed the belly.  All counts were correct.  No complications noted.     I performed the entire procedure with the assistance of Lisabeth Pick DO as a co-surgeon.  Meade Maw MD

## 2020-06-01 ENCOUNTER — Encounter: Payer: Self-pay | Admitting: Neurosurgery

## 2020-06-01 NOTE — Progress Notes (Signed)
    Attending Progress Note  History: Martha Wilson is here for normal pressure hydrocephalus.  06/01/20: POD1 from VPS placement.  Stable overnight.  Some LUQ pain last night, resolved.  Overall doing well. No HA/N/V.  Physical Exam: Vitals:   06/01/20 0530 06/01/20 0600  BP: 116/65 112/68  Pulse: (!) 55 (!) 55  Resp: 15 15  Temp:    SpO2: 96% 95%    AA Ox3 CNI  Strength:5/5 throughout BUE and BLE  Data:  No results for input(s): NA, K, CL, CO2, BUN, CREATININE, LABGLOM, GLUCOSE, CALCIUM in the last 168 hours. No results for input(s): AST, ALT, ALKPHOS in the last 168 hours.  Invalid input(s): TBILI   No results for input(s): WBC, HGB, HCT, PLT in the last 168 hours. No results for input(s): APTT, INR in the last 168 hours.       Other tests/results: shunt xrays show good placement  Assessment/Plan:  [name] is doing well from VPS placement.  - mobilize - pain control - DVT prophylaxis - Will change dressing later today.   Meade Maw MD, [facility] Department of Neurosurgery

## 2020-06-01 NOTE — Discharge Instructions (Signed)
  Your surgeon has performed an operation for hydrocephalus. Many times, patients feel better immediately after surgery and can "overdo it." Even if you feel well, it is important that you follow these activity guidelines. If you do not let yourself heal properly from the surgery, you can increase the chance of a post-operative problem. The following are instructions to help in your recovery once you have been discharged from the hospital.  * Do not take anti-inflammatory medications for 3 days after surgery (naproxen [Aleve], ibuprofen [Advil, Motrin], celecoxib [Celebrex], etc.)  Activity    No bending, lifting, or twisting ("BLT"). Avoid lifting objects heavier than 10 pounds (gallon milk jug).  Where possible, avoid household activities that involve lifting, bending, pushing, or pulling such as laundry, vacuuming, grocery shopping, and childcare. Try to arrange for help from friends and family for these activities while you heal.  Increase physical activity slowly as tolerated.  Taking short walks is encouraged, but avoid strenuous exercise. Do not jog, run, bicycle, lift weights, or participate in any other exercises unless specifically allowed by your doctor. Avoid prolonged sitting, including car rides.  Talk to your doctor before resuming sexual activity.  You should not drive until cleared by your doctor.  Until released by your doctor, you should not return to work or school.  You should rest at home and let your body heal.   You may shower two days after your surgery.  However, keep your cranial ("head") incisions dry until the staples are removed. After showering, lightly dab your belly incisions dry. Do not take a tub bath or go swimming for 3 weeks, or until approved by your doctor at your follow-up appointment.  If you smoke, we strongly recommend that you quit.  Smoking has been proven to interfere with normal healing in your back and will dramatically reduce the success rate of  your surgery. Please contact [facility] (800-QUIT-NOW) and use the resources at www.[facility].com for assistance in stopping smoking.  Surgical Incision   If you have a dressing on your incision, you may remove it three days after your surgery. Keep your incision area clean and dry.  If you have staples or stitches on your incision, you should have a follow up scheduled for removal.   Diet            You may return to your usual diet. Be sure to stay hydrated.  When to Contact us  Although your surgery and recovery will likely be uneventful, you may have some residual numbness, aches, and pains. This is normal and should improve in the next few weeks.  However, should you experience any of the following, contact us immediately: . New numbness or weakness . Pain that is progressively getting worse, and is not relieved by your pain medications or rest . Bleeding, redness, swelling, pain, or drainage from surgical incision . Chills or flu-like symptoms . Fever greater than 101.0 F (38.3 C) . Problems with bowel or bladder functions . Difficulty breathing or shortness of breath . Warmth, tenderness, or swelling in your calf  Contact Information . During office hours (Monday-Friday 9 am to 5 pm), please call your physician at [phone] . After hours and weekends, please call [phone] and speak with the answering service, who will contact the doctor on call.  If that fails, call the Rachel Operator at [phone] and ask for the Neurosurgery Resident On Call  . For a life-threatening emergency, call 911

## 2020-06-01 NOTE — Discharge Summary (Signed)
Physician Discharge Summary  Patient ID: Martha Wilson MRN: [id number] DOB/AGE: 07/08/1950 70 y.o.  Admit date: 05/31/2020 Discharge date: 06/01/2020  Admission Diagnoses: Normal Pressure Hydrocephalus  Discharge Diagnoses:  Active Problems:   NPH (normal pressure hydrocephalus) [facility])   Discharged Condition: good  Hospital Course:  Ms. Hem is a 70 yo female who presented with hydrocephalus.  She had a ventriculoperitoneal shunt placed, and was observed overnight.  She did very well and was stable for discharge on POD1.  Consults: None  Significant Diagnostic Studies: radiology: xrays showing good placement  Treatments: surgery: R VPS placement  Discharge Exam: Blood pressure 117/77, pulse 68, temperature 98.4 F (36.9 C), temperature source Oral, resp. rate (!) 25, height 5\' 5"  (1.651 m), weight 71.1 kg, SpO2 97 %. 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   aspirin EC 81 MG tablet Take 1 tablet (81 mg total) by mouth daily. Swallow whole.   atorvastatin 20 MG tablet Commonly known as: LIPITOR Take 1 tablet (20 mg total) by mouth daily.   B-12 2500 MCG Tabs Take 2,500 mcg by mouth daily.   cholecalciferol 25 MCG (1000 UNIT) tablet Commonly known as: VITAMIN D3 Take 1,000 Units by mouth daily.   fluconazole 150 MG tablet Commonly known as: DIFLUCAN Take 1 tablet (150 mg total) by mouth daily.   Magnesium 400 MG Caps Take 400 mg by mouth daily.   methimazole 5 MG tablet Commonly known as: TAPAZOLE Take 2.5 mg by  mouth at bedtime.   PreserVision AREDS 2 Caps Take 1 capsule by mouth 2 (two) times daily.   pyridOXINE 100 MG tablet Commonly known as: VITAMIN B-6 Take 100 mg by mouth daily.   REFRESH OP Place 1 drop into both eyes 2 (two) times daily as needed (dry eye).   Turmeric 500 MG Caps Take 500 mg by mouth daily.   Xiidra 5 % Soln Generic drug: Lifitegrast Place 1 drop into both eyes 2 (two) times daily.       Follow-up Information    Meade Maw, MD Follow up.   Specialty: Neurosurgery Why: already scheduled Contact information: [place] [address] [phone]               Signed: Meade Maw 06/01/2020, 12:15 PM

## 2020-06-01 NOTE — Progress Notes (Signed)
Patient is resting comfortably and is in no distress. Patient was admitted to the ICU post VP shunt placement for monitoring. Her neuro status has been assessed hourly and they have been WDL. Earlier in the shift, the patient complained of left-sided chest pain that worsened with inspiration. An EKG was completed on day shift which was normal. The pain was most likely muskuloskeletal in nature, and administration of pain medication and rest provided relief. Later on in the shift, the patient complained of pain on the right side of her head related to the staples used to close the incision. She did not feel as if this pain was internal or as if it was worsening. Pain medication was administered once again to assist with relief, and she is resting without complications. Aside from mild bradycardia (lowest HR seen was 45, non-sustained) the patient's vital signs are WDL. Will continue to monitor.  Cameron Ali, RN

## 2020-06-01 NOTE — Anesthesia Postprocedure Evaluation (Signed)
Anesthesia Post Note  Patient: Film/video editor  Procedure(s) Performed: SHUNT INSERTION VENTRICULAR-PERITONEAL (Right ) APPLICATION OF CRANIAL NAVIGATION (N/A )  Patient location during evaluation: PACU Anesthesia Type: General Level of consciousness: awake and alert Pain management: pain level controlled Vital Signs Assessment: post-procedure vital signs reviewed and stable Respiratory status: spontaneous breathing and respiratory function stable Cardiovascular status: stable Anesthetic complications: no   No complications documented.   Last Vitals:  Vitals:   06/01/20 0530 06/01/20 0600  BP: 116/65 112/68  Pulse: (!) 55 (!) 55  Resp: 15 15  Temp:    SpO2: 96% 95%    Last Pain:  Vitals:   06/01/20 0400  TempSrc: Oral  PainSc: 0-No pain                 Martha Wilson K

## 2020-06-01 NOTE — Progress Notes (Signed)
RN explained discharge instructions to pt and friend at bedside, verbalized understanding. IVs removed. Assisted to dress. Wheeled to medical mall entrance and assisted into friends private vehicle.

## 2020-06-01 NOTE — Progress Notes (Addendum)
Pt A&OX4, VSS. Neuro WDL. Walked around nursing station with stand by no contact assist. Symmetrical strength in all extremities.

## 2020-06-06 DIAGNOSIS — Z982 Presence of cerebrospinal fluid drainage device: Secondary | ICD-10-CM

## 2020-06-06 HISTORY — DX: Presence of cerebrospinal fluid drainage device: Z98.2

## 2020-06-21 ENCOUNTER — Encounter: Payer: Self-pay | Admitting: Internal Medicine

## 2020-06-21 DIAGNOSIS — Z982 Presence of cerebrospinal fluid drainage device: Secondary | ICD-10-CM | POA: Insufficient documentation

## 2020-07-04 ENCOUNTER — Ambulatory Visit: Payer: Medicare HMO | Admitting: Podiatry

## 2020-07-04 ENCOUNTER — Encounter: Payer: Self-pay | Admitting: Podiatry

## 2020-07-04 ENCOUNTER — Other Ambulatory Visit: Payer: Self-pay

## 2020-07-04 DIAGNOSIS — B351 Tinea unguium: Secondary | ICD-10-CM | POA: Diagnosis not present

## 2020-07-04 DIAGNOSIS — M79674 Pain in right toe(s): Secondary | ICD-10-CM

## 2020-07-04 DIAGNOSIS — L603 Nail dystrophy: Secondary | ICD-10-CM | POA: Diagnosis not present

## 2020-07-04 DIAGNOSIS — M79675 Pain in left toe(s): Secondary | ICD-10-CM

## 2020-07-04 NOTE — Progress Notes (Signed)
Subjective:  Patient ID: Martha Wilson, female    DOB: [birth year],  MRN: [id number]  Chief Complaint  Patient presents with  . Nail Problem    Nail trim RFC    70 y.o. female presents with the above complaint.  Patient presents with thickened elongated dystrophic toenails x10.  Pain to palpation.  They are not able to do with her themselves.  They would like for [place] to debride them down.  She states that she would also like to discuss treatment options for the dystrophic mycotic left hallux.  She would like to know about all the all the options available for treating fungus.  She has not tried anything.  She is hesitant to try things.  She denies any other acute complaints.  Review of Systems: Negative except as noted in the HPI. Denies N/V/F/Ch.  Past Medical History:  Diagnosis Date  . Allergies   . Arthritis    right knee  . Headache   . History of methicillin resistant staphylococcus aureus (MRSA)   . Hyperthyroidism   . MVP (mitral valve prolapse)   . Normal pressure hydrocephalus (HCC)   . Osteopenia   . Palpitations    "stress related"  . Skin cancer    BASAL CELL  . 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 Tricuspid valve prolapse   . Wears dentures    partial upper and lower    Current Outpatient Medications:  .  acetaminophen (TYLENOL) 500 MG tablet, Take 1,000 mg by mouth every 6 (six) hours as needed for moderate pain or headache., Disp: , Rfl:  .  aspirin EC 81 MG tablet, Take 1 tablet (81 mg total) by mouth daily. Swallow whole., Disp: 30 tablet, Rfl: 5 .  atorvastatin (LIPITOR) 20 MG tablet, Take 1 tablet (20 mg total) by mouth daily. (Patient not taking: Reported on 05/19/2020), Disp: 30 tablet, Rfl: 5 .  cholecalciferol (VITAMIN D3) 25 MCG (1000 UNIT) tablet, Take 1,000 Units by mouth daily., Disp: , Rfl:  .  Cyanocobalamin (B-12) 2500 MCG TABS, Take 2,500 mcg by mouth daily., Disp: , Rfl:  .  fluconazole  (DIFLUCAN) 150 MG tablet, Take 1 tablet (150 mg total) by mouth daily. (Patient not taking: Reported on 05/10/2020), Disp: 2 tablet, Rfl: 0 .  Lifitegrast (XIIDRA) 5 % SOLN, Place 1 drop into both eyes 2 (two) times daily., Disp: , Rfl:  .  Magnesium 400 MG CAPS, Take 400 mg by mouth daily., Disp: , Rfl:  .  methimazole (TAPAZOLE) 5 MG tablet, Take 2.5 mg by mouth at bedtime., Disp: , Rfl:  .  Multiple Vitamins-Minerals (PRESERVISION AREDS 2) CAPS, Take 1 capsule by mouth 2 (two) times daily., Disp: , Rfl:  .  Polyvinyl Alcohol-Povidone (REFRESH OP), Place 1 drop into both eyes 2 (two) times daily as needed (dry eye)., Disp: , Rfl:  .  pyridOXINE (VITAMIN B-6) 100 MG tablet, Take 100 mg by mouth daily., Disp: , Rfl:  .  Turmeric 500 MG CAPS, Take 500 mg by mouth daily., Disp: , Rfl:   Social History   Tobacco Use  Smoking Status Current Every Day Smoker  . Packs/day: 0.25  . Years: 35.00  . Pack years: 8.75  . Types: Cigarettes  Smokeless Tobacco Never Used  Tobacco Comment   "occasional" cigarette    Allergies  Allergen Reactions  . Bee Venom Anaphylaxis  . Latex Itching  . Penicillins     Paralysis-high school with mono-legs became  paralyzed after taking penicillin  . Adhesive [Tape] Rash    Band-aids   Objective:  There were no vitals filed for this visit. There is no height or weight on file to calculate BMI. Constitutional Well developed. Well nourished.  Vascular Dorsalis pedis pulses palpable bilaterally. Posterior tibial pulses palpable bilaterally. Capillary refill normal to all digits.  No cyanosis or clubbing noted. Pedal hair growth normal.  Neurologic Normal speech. Oriented to person, place, and time. Epicritic sensation to light touch grossly present bilaterally.  Dermatologic  dystrophic mycotic nail noted to the left hallux that seems to be slightly more severe than the ones listed below.  Mild pain on palpation. Nail examination shows thickened elongated  dystrophic mycotic nails x10.  Pain on palpation Skin within normal limits  Orthopedic: Normal joint ROM without pain or crepitus bilaterally. No visible deformities. No bony tenderness.   Radiographs: None Assessment:   1. Pain due to onychomycosis of toenails of both feet   2. Onychodystrophy    Plan:  Patient was evaluated and treated and all questions answered.  Left hallux nail dystrophy -Educated the patient on the etiology of onychomycosis and various treatment options associated with improving the fungal load.  I explained to the patient that there is 3 treatment options available to treat the onychomycosis including topical, p.o., laser treatment.  Patient elected to undergo no treatment options for now.  She just wanted to get more information on the dystrophic nature of the nail.  For now we will hold off on any treatment options  Onychomycosis with pain  -Nails palliatively debrided as below. -Educated on self-care  Procedure: Nail Debridement Rationale: pain  Type of Debridement: manual, sharp debridement. Instrumentation: Nail nipper, rotary burr. Number of Nails: 10  Procedures and Treatment: Consent by patient was obtained for treatment procedures. The patient understood the discussion of treatment and procedures well. All questions were answered thoroughly reviewed. Debridement of mycotic and hypertrophic toenails, 1 through 5 bilateral and clearing of subungual debris. No ulceration, no infection noted.  Return Visit-Office Procedure: Patient instructed to return to the office for a follow up visit 3 months for continued evaluation and treatment.  Boneta Lucks, DPM    No follow-ups on file.   No follow-ups on file.

## 2020-07-26 ENCOUNTER — Ambulatory Visit: Payer: Medicare HMO

## 2020-09-07 ENCOUNTER — Telehealth: Payer: Self-pay | Admitting: Internal Medicine

## 2020-09-07 NOTE — Telephone Encounter (Signed)
Left message for patient to call back and schedule Medicare Annual Wellness Visit (AWV) in office.   If not able to come in office, please offer to do virtually or by telephone.   Last AWV:11/16/2018   Please schedule at anytime with Nurse Health Advisor.

## 2020-09-11 ENCOUNTER — Telehealth: Payer: Self-pay | Admitting: Internal Medicine

## 2020-09-11 NOTE — Telephone Encounter (Signed)
Left message for patient to call back and schedule Medicare Annual Wellness Visit (AWV) in office.   If not able to come in office, please offer to do virtually or by telephone.   Last AWV:11/16/2018   Please schedule at anytime with Nurse Health Advisor.

## 2020-09-18 ENCOUNTER — Other Ambulatory Visit: Payer: Self-pay

## 2020-09-18 ENCOUNTER — Other Ambulatory Visit: Payer: Self-pay | Admitting: Surgery

## 2020-09-18 ENCOUNTER — Ambulatory Visit
Admission: RE | Admit: 2020-09-18 | Discharge: 2020-09-18 | Disposition: A | Discharge status: Home / Self Care | Payer: Medicare HMO | Source: Ambulatory Visit | Attending: Surgery | Admitting: Surgery

## 2020-09-18 DIAGNOSIS — R1011 Right upper quadrant pain: Secondary | ICD-10-CM

## 2020-09-18 IMAGING — US US ABDOMEN LIMITED
1 series · 14 of 25 positions shown · non-contrast
Comparison: None.

CLINICAL DATA: Right upper quadrant abdominal pain for 12 weeks.

EXAM:
ULTRASOUND ABDOMEN LIMITED RIGHT UPPER QUADRANT

[Series 1: us abdomen limited · 0.14mm/px · 14 of 45 slices shown]
[im 1/45]
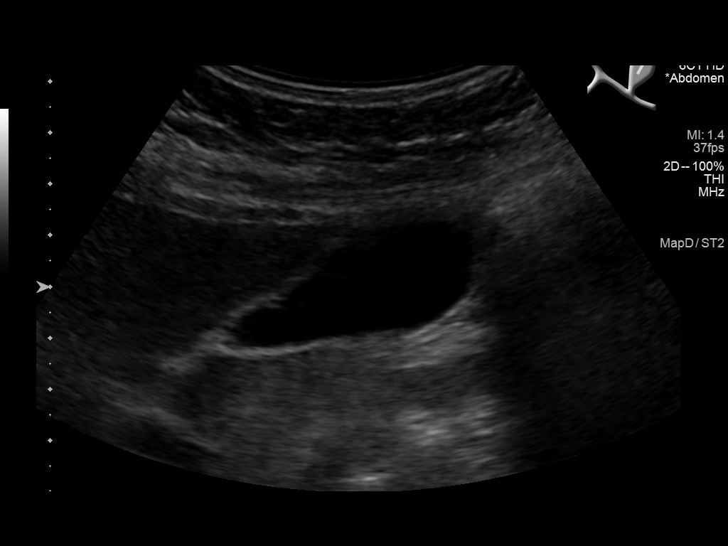
[im 4/45]
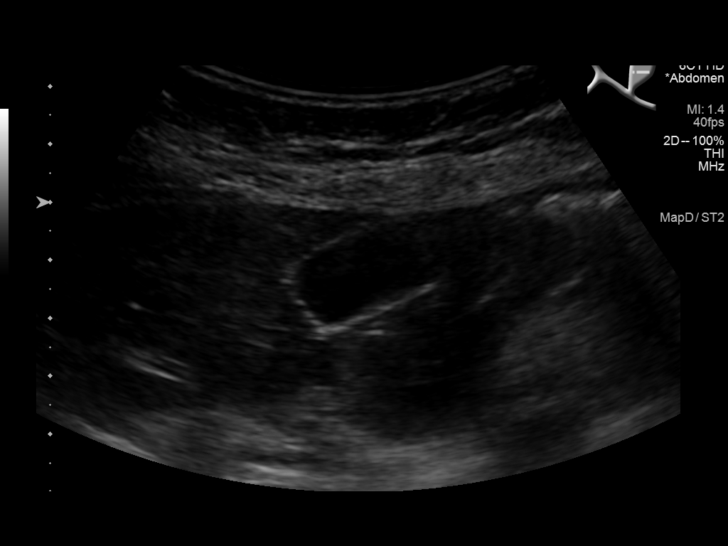
[im 8/45]
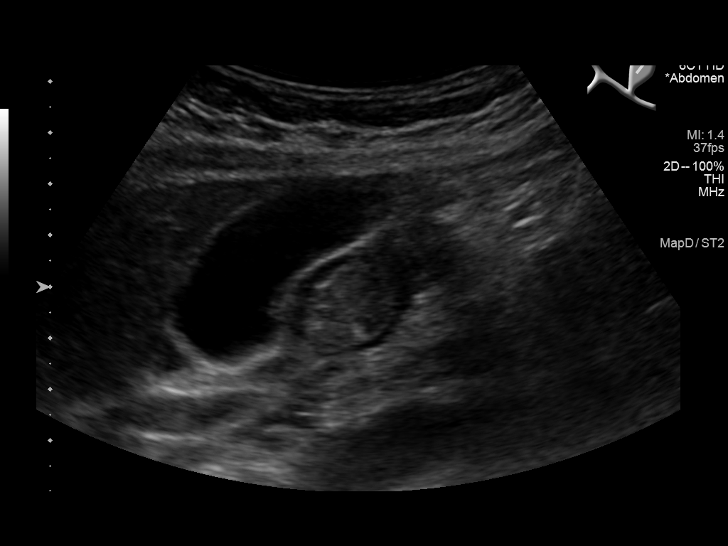
[im 12/45]
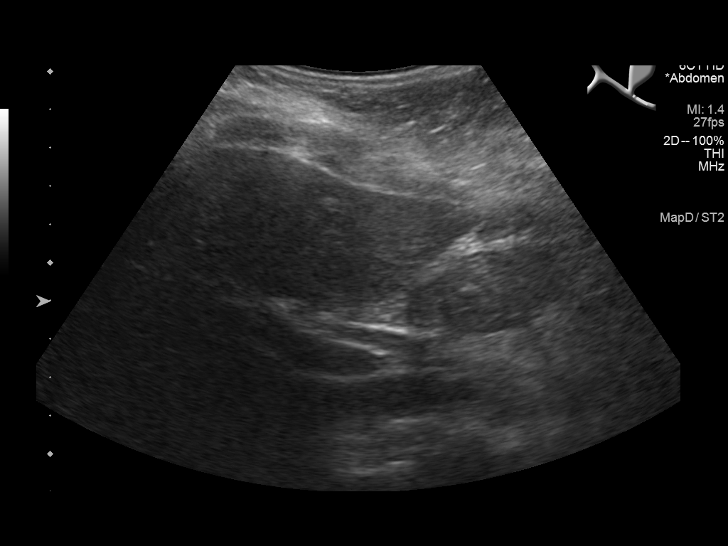
[im 15/45]
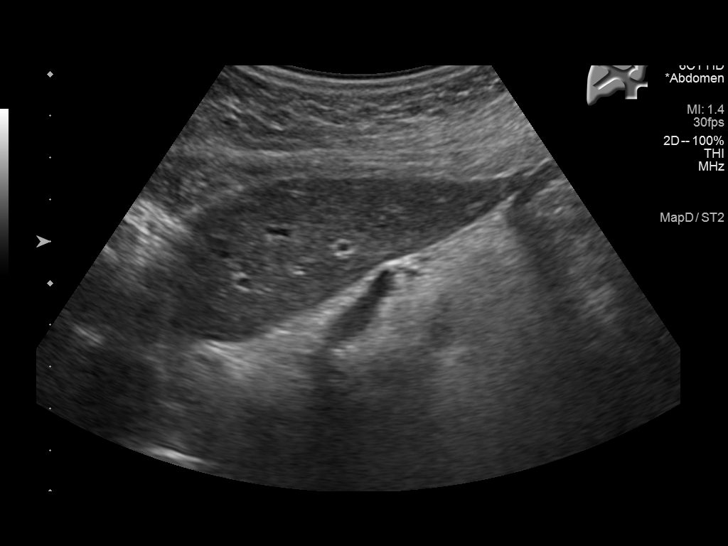
[im 17/45]
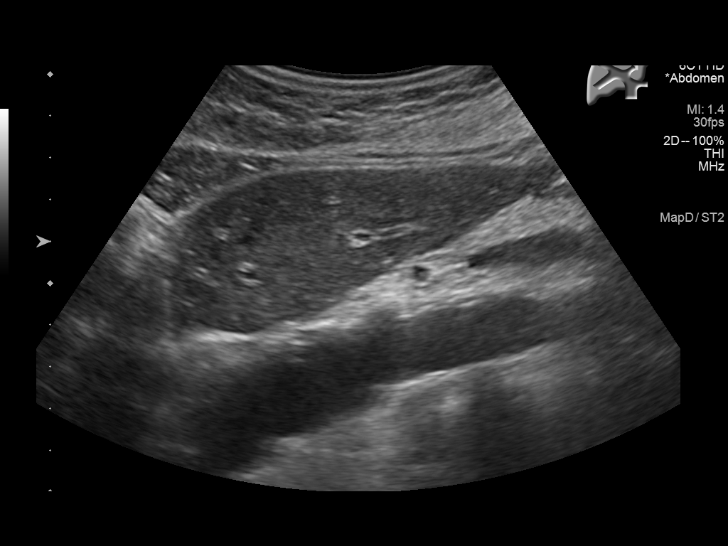
[im 21/45]
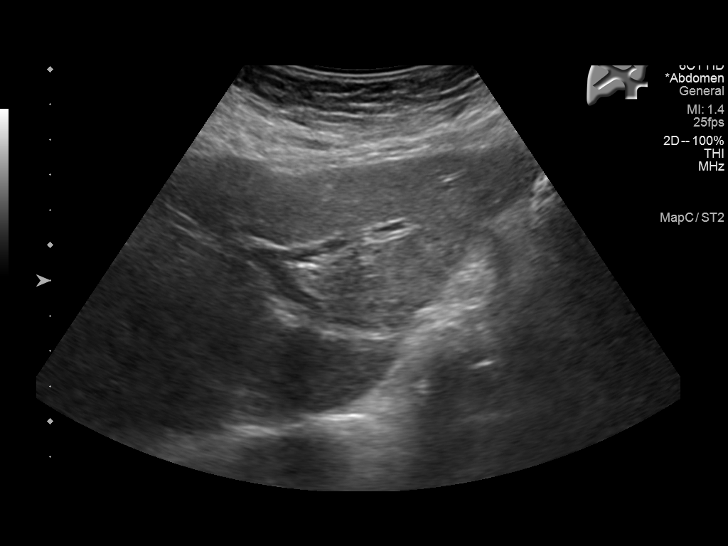
[im 24/45]
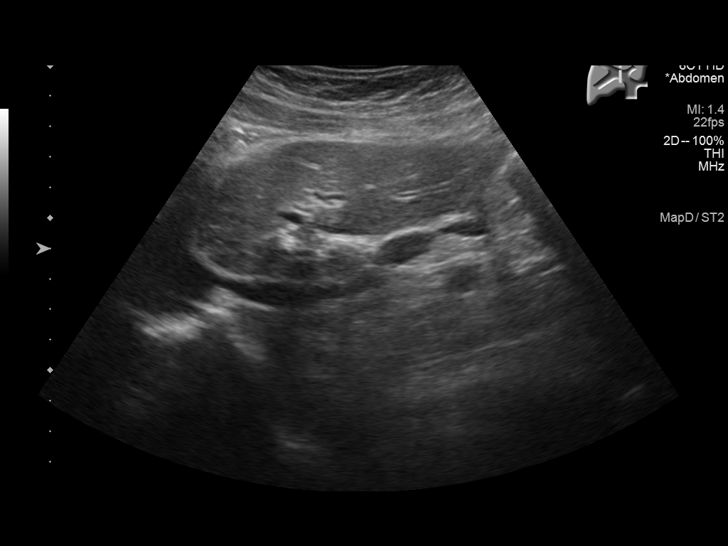
[im 28/45]
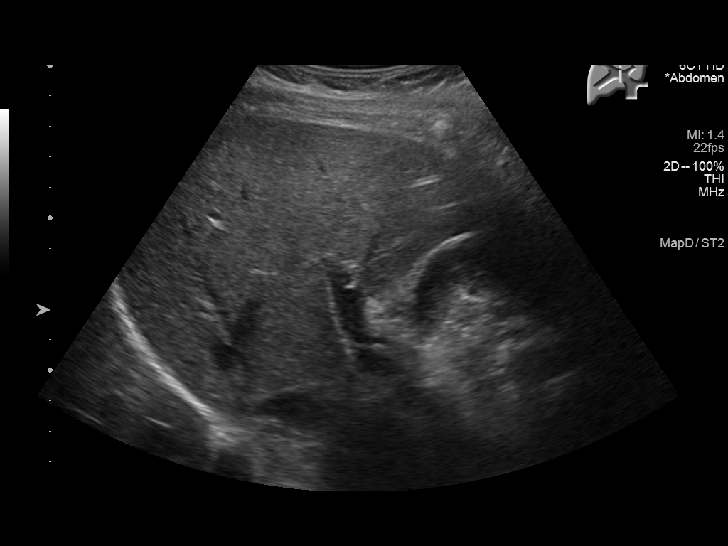
[im 30/45]
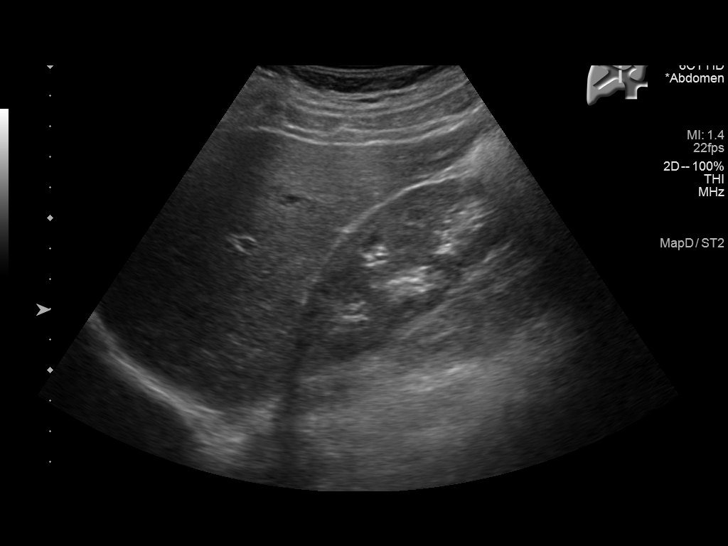
[im 34/45]
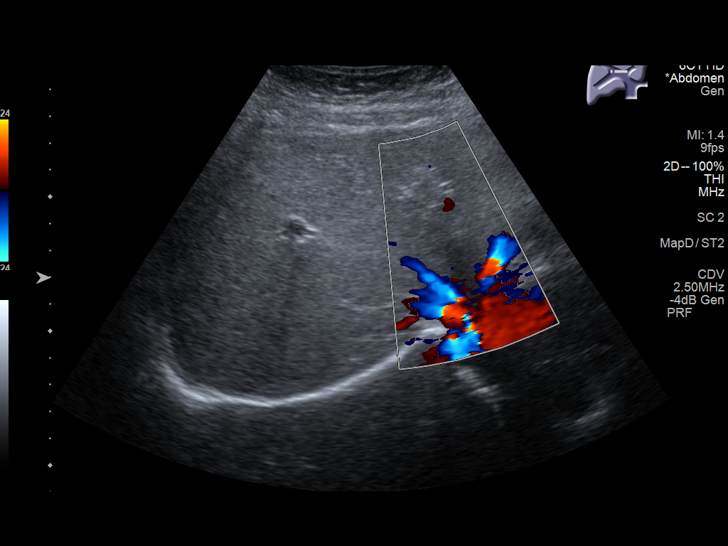
[im 37/45]
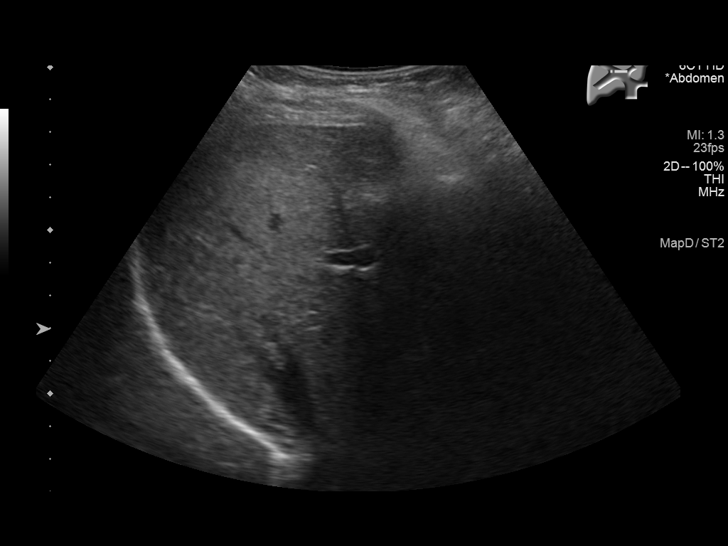
[im 41/45]
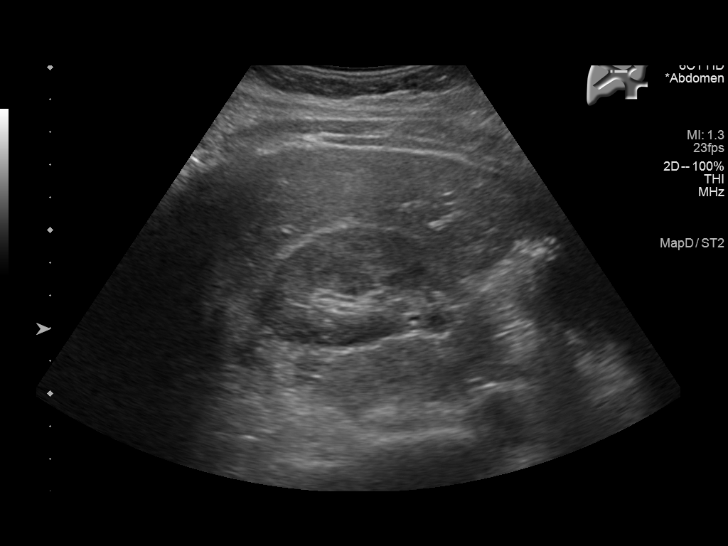
[im 45/45]
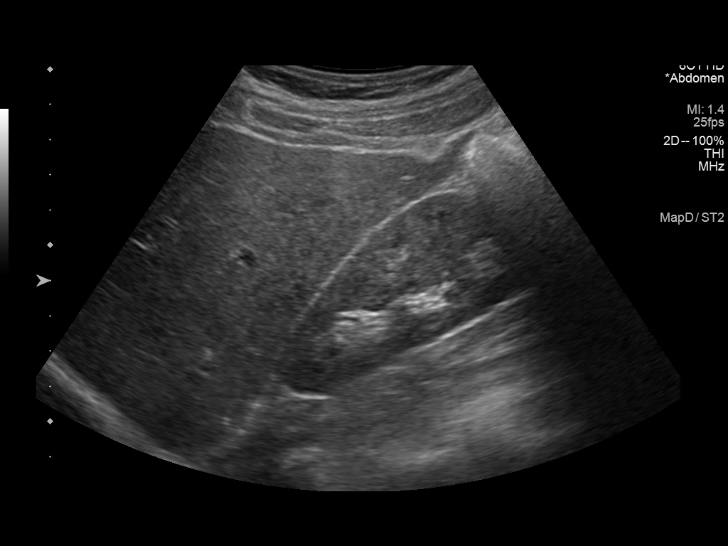

[14 of 25 positions shown; findings below may reference images not displayed]

FINDINGS: Gallbladder:

No gallstones or wall thickening visualized. No sonographic Murphy
sign noted by sonographer.

Common bile duct:

Diameter: 3 mm

Liver:

No focal lesion identified. Within normal limits in parenchymal
echogenicity. Portal vein is patent on color Doppler imaging with
normal direction of blood flow towards the liver.

Other: None.
IMPRESSION: Normal right upper quadrant abdominal sonogram.  No cholelithiasis.

## 2020-09-27 ENCOUNTER — Telehealth: Payer: Self-pay | Admitting: Internal Medicine

## 2020-09-27 NOTE — Telephone Encounter (Signed)
Left message for patient to call back and schedule Medicare Annual Wellness Visit (AWV) in office.   If not able to come in office, please offer to do virtually or by telephone.   Last AWV:11/16/2018  Please schedule at anytime with Nurse Health Advisor.

## 2020-10-13 ENCOUNTER — Telehealth: Payer: Self-pay

## 2020-10-13 NOTE — Telephone Encounter (Signed)
Spoke with patient to remind her to go to the [facility] before her appointment with Dr. Garen Lah on 10/16/20 to get fasting lipid panel drawn. Patient expresses gratitude for the reminder call and states she will go tomorrow.

## 2020-10-16 ENCOUNTER — Ambulatory Visit: Payer: Medicare HMO | Admitting: Cardiology

## 2020-10-16 ENCOUNTER — Telehealth: Payer: Self-pay

## 2020-10-16 ENCOUNTER — Encounter: Payer: Self-pay | Admitting: Cardiology

## 2020-10-16 ENCOUNTER — Other Ambulatory Visit: Payer: Self-pay

## 2020-10-16 VITALS — BP 118/78 | HR 77 | Ht 65.0 in | Wt 132.0 lb

## 2020-10-16 DIAGNOSIS — I2510 Atherosclerotic heart disease of native coronary artery without angina pectoris: Secondary | ICD-10-CM | POA: Diagnosis not present

## 2020-10-16 DIAGNOSIS — I361 Nonrheumatic tricuspid (valve) insufficiency: Secondary | ICD-10-CM

## 2020-10-16 NOTE — Patient Instructions (Addendum)
Medication Instructions:   Your physician recommends that you continue on your current medications as directed. Please refer to the Current Medication list given to you today.  *If you need a refill on your cardiac medications before your next appointment, please call your pharmacy*   Lab Work:  Your physician recommends that you return for a FASTING lipid profile: at your earliest convenience.  - You will need to be fasting. Please do not have anything to eat or drink after midnight the morning you have the lab work. You may only have water or black coffee with no cream or sugar.  - Please go to the [facility]. You will check in at the front desk to the right as you walk into the atrium. Valet Parking is offered if needed. - No appointment needed. You may go any day between 7 am and 6 pm.     Testing/Procedures:  Your physician has requested that you have an echocardiogram in 5 months. Echocardiography is a painless test that uses sound waves to create images of your heart. It provides your doctor with information about the size and shape of your heart and how well your heart's chambers and valves are working. This procedure takes approximately one hour. There are no restrictions for this procedure.    Follow-Up:  At [facility], you and your health needs are our priority.  As part of our continuing mission to provide you with exceptional heart care, we have created designated Provider Care Teams.  These Care Teams include your primary Cardiologist (physician) and Advanced Practice Providers (APPs -  Physician Assistants and Nurse Practitioners) who all work together to provide you with the care you need, when you need it.  We recommend signing up for the patient portal called "MyChart".  Sign up information is provided on this After Visit Summary.  MyChart is used to connect with patients for Virtual Visits (Telemedicine).  Patients are able to view lab/test results, encounter  notes, upcoming appointments, etc.  Non-urgent messages can be sent to your provider as well.   To learn more about what you can do with MyChart, go to [email or web address].    Your next appointment:   6 month(s)  The format for your next appointment:   In Person  Provider:   You may see Dr. Garen Lah or one of the following Advanced Practice Providers on your designated Care Team:   Murray Hodgkins, NP Christell Faith, PA-C Marrianne Mood, PA-C Cadence Kathlen Mody, [place]   Other Instructions

## 2020-10-16 NOTE — Progress Notes (Signed)
Cardiology Office Note:    Date:  10/16/2020   ID:  Martha Wilson, DOB November 21, 1950, MRN [id number]  PCP:  [name], MD  [place] Cardiologist:  None  [facility] Electrophysiologist:  None   Referring MD: [name], MD   Chief Complaint  Patient presents with   OTher    6 month follow up. Meds reviewed verbally with patient.     History of Present Illness:    Martha Wilson is a 70 y.o. 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    Patient was previously seen for preop evaluation and VP shunting planned for normal pressure hydrocephalus.  She underwent VP shunting successfully on 05/31/2020 at [facility].  She denies chest pain, shortness of breath, edema.  Tolerating aspirin and statin.   Prior notes left heart cath August 2014 20% proximal LAD lesion, mild diffuse disease in RCA.   Apparently has a history of tricuspid valve prolapse with mild RV dilatation.   Outside echo report 01/2020 showed normal systolic function, EF 30%.  Moderately enlarged RV, RV normal systolic function, RVSP 32 mmHg. Previous cardiologist was in [place].  She still gets yearly echocardiogram for tricuspid valve insufficiency with serial echocardiograms.  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   Tricuspid valve prolapse    Wears dentures    partial upper and lower    Past Surgical History:  Procedure Laterality Date   APPLICATION OF CRANIAL NAVIGATION N/A 05/31/2020   Procedure: APPLICATION OF CRANIAL  NAVIGATION;  Surgeon: Meade Maw, MD;  Location: [facility] ORS;  Service: Neurosurgery;  Laterality: N/A;   BREAST BIOPSY  1980's   pt states she had a bx in the 80's, not sure of side, benign   breat biopsy     CATARACT EXTRACTION W/PHACO Right 09/22/2018   Procedure: CATARACT EXTRACTION PHACO AND INTRAOCULAR LENS PLACEMENT ([place])  RIGHT panooptix;  Surgeon: Birder Robson, MD;  Location: [place];  Service: Ophthalmology;  Laterality: Right;   CATARACT EXTRACTION W/PHACO Left 10/06/2018   Procedure: CATARACT EXTRACTION PHACO AND INTRAOCULAR LENS PLACEMENT ([place]) LEFT PANOPTIX LENS;  Surgeon: Birder Robson, MD;  Location: [place];  Service: Ophthalmology;  Laterality: Left;   COLONOSCOPY     COLONOSCOPY WITH PROPOFOL N/A 11/19/2018   Procedure: COLONOSCOPY WITH PROPOFOL;  Surgeon: Jonathon Bellows, MD;  Location: [facility] ENDOSCOPY;  Service: Gastroenterology;  Laterality: N/A;   ORIF FOOT FRACTURE     VENTRICULOPERITONEAL SHUNT Right 05/31/2020   Procedure: SHUNT INSERTION VENTRICULAR-PERITONEAL;  Surgeon: Meade Maw, MD;  Location: [facility] OR[facility];  Service: Neurosurgery;  Laterality: Right;  Dr Lysle Pearl to assist    Current Medications: Current Meds  Medication Sig   acetaminophen (TYLENOL) 500 MG tablet Take 1,000 mg by mouth every 6 (six) hours as needed for moderate pain or headache.   aspirin EC 81 MG tablet Take 1 tablet (81 mg total) by mouth daily. Swallow whole.   atorvastatin (LIPITOR) 20 MG  tablet Take 1 tablet (20 mg total) by mouth daily.   Cyanocobalamin (B-12) 2500 MCG TABS Take 2,500 mcg by mouth daily.   Lifitegrast (XIIDRA) 5 % SOLN Place 1 drop into both eyes 2 (two) times daily.   Magnesium 400 MG CAPS Take 400 mg by mouth daily.   methimazole (TAPAZOLE) 5 MG tablet Take 2.5 mg by mouth at bedtime.   Multiple Vitamins-Minerals (PRESERVISION AREDS 2) CAPS Take 1 capsule by mouth 2 (two) times daily.   pyridOXINE (VITAMIN B-6) 100 MG tablet Take 100 mg  by mouth daily.     Allergies:   Bee venom, Latex, Penicillins, and Adhesive [tape]   Social History   Socioeconomic History   Marital status: Single    Spouse name: Not on file   Number of children: Not on file   Years of education: Not on file   Highest education level: Not on file  Occupational History   Not on file  Tobacco Use   Smoking status: Former    Packs/day: 0.00    Years: 35.00    Pack years: 0.00    Types: Cigarettes   Smokeless tobacco: Never   Tobacco comments:    "occasional" cigarette  Vaping Use   Vaping Use: Never used  Substance and Sexual Activity   Alcohol use: Yes    Alcohol/week: 3.0 standard drinks    Types: 3 Glasses of wine per week    Comment: OCC   Drug use: Never   Sexual activity: Not Currently  Other Topics Concern   Not on file  Social History Narrative   Not on file   Social Determinants of Health   Financial Resource Strain: Not on file  Food Insecurity: Not on file  Transportation Needs: Not on file  Physical Activity: Not on file  Stress: Not on file  Social Connections: Not on file     Family History: The patient's family history includes Early death in her brother; Heart attack in her father; Thyroid disease in her brother.  ROS:   Please see the history of present illness.     All other systems reviewed and are negative.  EKGs/Labs/Other Studies Reviewed:    The following studies were reviewed today: Echocardiogram 01/10/2020   1. Normal left ventricular chamber size. Normal wall motion and systolic  function.  The biplane LVEF is 63%.    2. Moderately enlarged right ventricle with normal systolic function.    3. The tricuspid valve is not well-visualized.  There is an eccentric jet of  tricuspid regurgitation directed towards the septum.  Visually, it only  appears mild in severity but may be underestimated due to incomplete  visualization.    4. The estimated right ventricular systolic pressure is 32 mmHg.     5. Inferior vena cava is not well visualized but appears normal in  dimension.  The hepatic veins were not visualized.    6. When directly compared to the prior study from 01/11/2019, the TR appears  similar in severity.    EKG:  EKG is  ordered today.  The ekg ordered today demonstrates normal sinus rhythm  Recent Labs: 11/16/2019: ALT 21; BUN 13; Creatinine, Ser 0.98; Magnesium 1.9; Potassium 4.3; Sodium 139; TSH 2.11 12/21/2019: Hemoglobin 14.7; Platelets 291.0  Recent Lipid Panel    Component Value Date/Time   CHOL 196 11/16/2019 0912   TRIG 136.0 11/16/2019 0912   HDL 75.10 11/16/2019 0912   CHOLHDL 3 11/16/2019 0912   VLDL 27.2 11/16/2019 0912  [place] 94 11/16/2019 0912     Risk Assessment/Calculations:      Physical Exam:    VS:  BP 118/78 (BP Location: Left Arm, Patient Position: Sitting, Cuff Size: Normal)   Pulse 77   Ht 5\' 5"  (1.651 m)   Wt 132 lb (59.9 kg)   SpO2 98%   BMI 21.97 kg/m     Wt Readings from Last 3 Encounters:  10/16/20 132 lb (59.9 kg)  05/31/20 156 lb 12 oz (71.1 kg)  05/19/20 135 lb 2.3 oz (61.3 kg)     GEN:  Well nourished, well developed in no acute distress HEENT: Normal NECK: No JVD; No carotid bruits LYMPHATICS: No lymphadenopathy CARDIAC: RRR, no murmurs, rubs, gallops RESPIRATORY:  Clear to auscultation without rales, wheezing or rhonchi  ABDOMEN: Soft, non-tender, non-distended MUSCULOSKELETAL:  No edema; No deformity  SKIN: Warm and dry NEUROLOGIC:  Alert and oriented x 3 PSYCHIATRIC:  Normal affect   ASSESSMENT:    1. Coronary artery disease involving native coronary artery of native heart without angina pectoris   2. Nonrheumatic tricuspid valve regurgitation     PLAN:    In order of problems listed above:  Nonobstructive CAD, minimal diffuse RCA, 20% proximal LAD.  Denies chest pain.  Continue aspirin 81 mg, Lipitor 20 mg daily.  Goal LDL less than 70.  Plan to obtain fasting lipid profile. History of tricuspid  regurgitation, moderate RV dilatation, monitor with serial echocardiograms yearly.  Repeat echo in 6 months.  Follow-up in 6 months after repeat echo.   Medication Adjustments/Labs and Tests Ordered: Current medicines are reviewed at length with the patient today.  Concerns regarding medicines are outlined above.  Orders Placed This Encounter  Procedures   Lipid panel   EKG 12-Lead   ECHOCARDIOGRAM COMPLETE    No orders of the defined types were placed in this encounter.   Patient Instructions  Medication Instructions:   Your physician recommends that you continue on your current medications as directed. Please refer to the Current Medication list given to you today.  *If you need a refill on your cardiac medications before your next appointment, please call your pharmacy*   Lab Work:  Your physician recommends that you return for a FASTING lipid profile: at your earliest convenience.  - You will need to be fasting. Please do not have anything to eat or drink after midnight the morning you have the lab work. You may only have water or black coffee with no cream or sugar.  - Please go to the [facility]. You will check in at the front desk to the right as you walk into the atrium. Valet Parking is offered if needed. - No appointment needed. You may go any day between 7 am and 6 pm.     Testing/Procedures:  Your physician has requested that you have an echocardiogram in 5 months. Echocardiography is a painless test that uses sound waves to create images of your heart. It provides your doctor with information about the size and shape of your heart and how well your heart's chambers and valves are working. This procedure takes approximately one hour. There are no restrictions for this procedure.    Follow-Up:  At [facility], you and your health needs are our priority.  As part of our continuing mission to provide you with exceptional heart care, we have created  designated Provider Care Teams.  These Care Teams include your primary Cardiologist (physician) and Advanced Practice Providers (APPs -  Physician Assistants and Nurse Practitioners) who all work together to provide you with the care you need, when you need it.  We recommend signing up for the patient portal called "MyChart".  Sign up information is provided on this After Visit Summary.  MyChart is used to connect with patients for Virtual Visits (Telemedicine).  Patients are able to view lab/test results, encounter notes, upcoming appointments, etc.  Non-urgent messages can be sent to your provider as well.   To learn more about what you can do with MyChart, go to [email or web address].    Your next appointment:   6 month(s)  The format for your next appointment:   In Person  Provider:   You may see Dr. Garen Lah or one of the following Advanced Practice Providers on your designated Care Team:   Murray Hodgkins, NP Christell Faith, PA-C Marrianne Mood, PA-C Cadence Calzada, [place]   Other Instructions    Signed, Kate Sable, MD  10/16/2020 12:28 PM    [place]

## 2020-10-31 ENCOUNTER — Other Ambulatory Visit: Payer: Self-pay

## 2020-10-31 MED ORDER — ATORVASTATIN CALCIUM 20 MG PO TABS: 20.0000 mg | ORAL_TABLET | Freq: Every day | ORAL | 5 refills | Status: DC

## 2020-11-02 ENCOUNTER — Ambulatory Visit: Payer: Medicare HMO | Admitting: Podiatry

## 2020-11-02 ENCOUNTER — Other Ambulatory Visit: Payer: Self-pay

## 2020-11-02 ENCOUNTER — Encounter: Payer: Self-pay | Admitting: Podiatry

## 2020-11-02 DIAGNOSIS — B351 Tinea unguium: Secondary | ICD-10-CM | POA: Diagnosis not present

## 2020-11-02 DIAGNOSIS — M79675 Pain in left toe(s): Secondary | ICD-10-CM

## 2020-11-02 DIAGNOSIS — M79674 Pain in right toe(s): Secondary | ICD-10-CM | POA: Diagnosis not present

## 2020-11-02 NOTE — Progress Notes (Signed)
Subjective:  Patient ID: Martha Wilson, female    DOB: 12/15/1950,  MRN: [id number]  Chief Complaint  Patient presents with   Nail Problem    Nail trim    70 y.o. female presents with the above complaint.  Patient presents with thickened elongated dystrophic toenails x10.  Pain to palpation.  They are not able to do with her themselves.  They would like for [place] to debride them down.  She states that she would also like to discuss treatment options for the dystrophic mycotic left hallux.  She would like to know about all the all the options available for treating fungus.  She has not tried anything.  She is hesitant to try things.  She denies any other acute complaints.  Review of Systems: Negative except as noted in the HPI. Denies N/V/F/Ch.  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 Swallow whole., Disp: 30 tablet, Rfl: 5   atorvastatin (LIPITOR) 20 MG tablet, Take 1 tablet (20 mg total) by mouth daily., Disp: 30 tablet, Rfl: 5   cholecalciferol (VITAMIN D3) 25 MCG (1000 UNIT) tablet, Take 1,000 Units by mouth daily. (Patient not taking: Reported on 10/16/2020), Disp: , Rfl:    Cyanocobalamin (B-12) 2500 MCG TABS, Take 2,500 mcg by mouth daily., Disp: , Rfl:    fluconazole (DIFLUCAN) 150 MG tablet, Take  1 tablet (150 mg total) by mouth daily. (Patient not taking: No sig reported), Disp: 2 tablet, Rfl: 0   Lifitegrast (XIIDRA) 5 % SOLN, Place 1 drop into both eyes 2 (two) times daily., Disp: , Rfl:    Magnesium 400 MG CAPS, Take 400 mg by mouth daily., Disp: , Rfl:    methimazole (TAPAZOLE) 5 MG tablet, Take 2.5 mg by mouth at bedtime., Disp: , Rfl:    Multiple Vitamins-Minerals (PRESERVISION AREDS 2) CAPS, Take 1 capsule by mouth 2 (two) times daily., Disp: , Rfl:    Polyvinyl Alcohol-Povidone (REFRESH OP), Place 1 drop into both eyes 2 (two) times daily as needed (dry eye). (Patient not taking: Reported on 10/16/2020), Disp: , Rfl:    pyridOXINE (VITAMIN B-6) 100 MG tablet, Take 100 mg by mouth daily., Disp: , Rfl:    Turmeric 500 MG CAPS, Take 500 mg by mouth daily. 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 There is no height or weight on file to calculate BMI. Constitutional Well developed. Well nourished.  Vascular Dorsalis pedis pulses palpable bilaterally. Posterior tibial pulses palpable bilaterally. Capillary refill normal to all digits.  No cyanosis or clubbing noted. Pedal hair growth normal.  Neurologic Normal speech. Oriented to person, place, and time. Epicritic sensation to light touch grossly present bilaterally.  Dermatologic  dystrophic mycotic nail noted to the left hallux that seems to be slightly more severe than the ones listed below.  Mild pain on palpation. Nail examination shows  thickened elongated dystrophic mycotic nails x10.  Pain on palpation Skin within normal limits  Orthopedic: Normal joint ROM without pain or crepitus bilaterally. No visible deformities. No bony tenderness.   Radiographs: None Assessment:   1. Pain due to onychomycosis of toenails of both feet     Plan:  Patient was evaluated and treated and all questions answered.  Left hallux nail dystrophy -Educated the patient on the etiology of onychomycosis and various treatment options associated with improving the fungal load.  I explained to the patient that there is 3 treatment options available to treat the onychomycosis including topical, p.o., laser treatment.  Patient elected to undergo no treatment options for now.  She just wanted to get more information on the dystrophic nature of the nail.  For now we will hold off on any treatment options  Onychomycosis with pain  -Nails palliatively debrided as below. -Educated on self-care  Procedure: Nail Debridement Rationale: pain  Type of Debridement: manual, sharp debridement. Instrumentation: Nail nipper, rotary burr. Number of Nails: 10  Procedures and Treatment: Consent by patient was obtained for treatment procedures. The patient understood the discussion of treatment and procedures well. All questions were answered thoroughly reviewed. Debridement of mycotic and hypertrophic toenails, 1 through 5 bilateral and clearing of subungual debris. No ulceration, no infection noted.  Return Visit-Office Procedure: Patient instructed to return to the office for a follow up visit 3 months for continued evaluation and treatment.  Boneta Lucks, DPM    No follow-ups on file.   No follow-ups on file.

## 2020-11-08 ENCOUNTER — Telehealth: Payer: Self-pay | Admitting: Internal Medicine

## 2020-11-08 NOTE — Telephone Encounter (Signed)
Left message for patient to call back and schedule Medicare Annual Wellness Visit (AWV) in office.   If not able to come in office, please offer to do virtually or by telephone.   Last AWV:11/16/2018  Please schedule at anytime with Nurse Health Advisor.

## 2020-11-15 ENCOUNTER — Other Ambulatory Visit
Admission: RE | Admit: 2020-11-15 | Discharge: 2020-11-15 | Disposition: A | Discharge status: Home / Self Care | Payer: Medicare HMO | Attending: Cardiology | Admitting: Cardiology

## 2020-11-15 DIAGNOSIS — I2510 Atherosclerotic heart disease of native coronary artery without angina pectoris: Secondary | ICD-10-CM | POA: Diagnosis present

## 2020-11-15 LAB — LIPID PANEL
Cholesterol: 159 mg/dL (ref 0–200)
HDL: 72 mg/dL (ref >40)
LDL Cholesterol: 71 mg/dL (ref 0–99)
Total CHOL/HDL Ratio: 2.2 RATIO
Triglycerides: 80 mg/dL (ref <150)
VLDL: 16 mg/dL (ref 0–40)

## 2020-11-20 ENCOUNTER — Telehealth: Payer: Self-pay | Admitting: Internal Medicine

## 2020-11-20 DIAGNOSIS — E0590 Thyrotoxicosis, unspecified without thyrotoxic crisis or storm: Secondary | ICD-10-CM

## 2020-11-20 NOTE — Telephone Encounter (Signed)
LMTCB

## 2020-11-20 NOTE — Telephone Encounter (Signed)
PT called to advise that she would like to get the information for the Thyroid Dr again as she has misplaced it. Please advise as to the name and number.

## 2020-11-20 NOTE — Telephone Encounter (Signed)
Patient returned office phone call. 

## 2020-11-20 NOTE — Telephone Encounter (Signed)
Spoke with pt and she would like to have the referral to [facility] resubmitted because she is not going to be able to return to [place] were she was seeing a endocrinologist.

## 2020-11-21 NOTE — Addendum Note (Signed)
Addended by: [name] on: 11/21/2020 08:28 AM   Modules accepted: Orders

## 2020-11-21 NOTE — Telephone Encounter (Signed)
Spoke with pt and she gave a verbal understanding. Pt stated that she would give them a call before getting.

## 2020-11-21 NOTE — Telephone Encounter (Signed)
Called pt to let her know that the referral has been submitted. Pt has scheduled an appt at the pharmacy this evening for a Shingles vaccine and would like to make sure it is okay for her to get it. She is concerned because she has had recent brain surgery.

## 2020-11-29 ENCOUNTER — Telehealth: Payer: Self-pay | Admitting: Internal Medicine

## 2020-11-29 NOTE — Telephone Encounter (Signed)
Pt stated she has not heard from [facility] in regards to her referral that was placed on 11/21/2020.   Spoke with pt and scheduled her for a follow up to discuss the urinary incontinence that she has been experiencing since post brain surgery.

## 2020-11-29 NOTE — Telephone Encounter (Signed)
Patient is calling to check on the status of her recent referral.She is also having some other health issues and would like to have a physical,no openings until 9/26.Please advise.

## 2020-12-01 ENCOUNTER — Telehealth: Payer: Self-pay | Admitting: Cardiology

## 2020-12-01 NOTE — Telephone Encounter (Signed)
Please call with lab results 

## 2020-12-01 NOTE — Telephone Encounter (Signed)
Called patient and informed her that we went her lab results to [facility] on 11/20/20, and that everything was normal.  Patient was appreciative for the call back.

## 2020-12-01 NOTE — Telephone Encounter (Signed)
Noted  

## 2020-12-15 ENCOUNTER — Other Ambulatory Visit: Payer: Self-pay | Admitting: Internal Medicine

## 2020-12-15 DIAGNOSIS — Z1231 Encounter for screening mammogram for malignant neoplasm of breast: Secondary | ICD-10-CM

## 2020-12-25 ENCOUNTER — Other Ambulatory Visit: Payer: Self-pay

## 2020-12-25 ENCOUNTER — Ambulatory Visit
Admission: RE | Admit: 2020-12-25 | Discharge: 2020-12-25 | Disposition: A | Discharge status: Home / Self Care | Payer: Medicare HMO | Source: Ambulatory Visit | Attending: Internal Medicine | Admitting: Internal Medicine

## 2020-12-25 ENCOUNTER — Ambulatory Visit (INDEPENDENT_AMBULATORY_CARE_PROVIDER_SITE_OTHER): Payer: Medicare HMO | Admitting: Internal Medicine

## 2020-12-25 ENCOUNTER — Encounter: Payer: Self-pay | Admitting: Internal Medicine

## 2020-12-25 VITALS — BP 122/70 | HR 83 | Ht 65.0 in | Wt 133.0 lb

## 2020-12-25 DIAGNOSIS — N3946 Mixed incontinence: Secondary | ICD-10-CM | POA: Diagnosis not present

## 2020-12-25 DIAGNOSIS — Z23 Encounter for immunization: Secondary | ICD-10-CM

## 2020-12-25 DIAGNOSIS — Z1231 Encounter for screening mammogram for malignant neoplasm of breast: Secondary | ICD-10-CM | POA: Diagnosis present

## 2020-12-25 DIAGNOSIS — E0590 Thyrotoxicosis, unspecified without thyrotoxic crisis or storm: Secondary | ICD-10-CM

## 2020-12-25 DIAGNOSIS — R1084 Generalized abdominal pain: Secondary | ICD-10-CM

## 2020-12-25 DIAGNOSIS — G912 (Idiopathic) normal pressure hydrocephalus: Secondary | ICD-10-CM

## 2020-12-25 DIAGNOSIS — E559 Vitamin D deficiency, unspecified: Secondary | ICD-10-CM

## 2020-12-25 DIAGNOSIS — Z982 Presence of cerebrospinal fluid drainage device: Secondary | ICD-10-CM

## 2020-12-25 DIAGNOSIS — R51 Headache with orthostatic component, not elsewhere classified: Secondary | ICD-10-CM

## 2020-12-25 DIAGNOSIS — H9313 Tinnitus, bilateral: Secondary | ICD-10-CM

## 2020-12-25 LAB — URINALYSIS, ROUTINE W REFLEX MICROSCOPIC
Bilirubin Urine: NEGATIVE
Ketones, ur: NEGATIVE
Leukocytes,Ua: NEGATIVE
Nitrite: NEGATIVE
Specific Gravity, Urine: 1.02 (ref 1.000–1.030)
Total Protein, Urine: NEGATIVE
Urine Glucose: NEGATIVE
Urobilinogen, UA: 0.2 (ref 0.0–1.0)
WBC, UA: NONE SEEN (ref 0–?)
pH: 5.5 (ref 5.0–8.0)

## 2020-12-25 LAB — COMPREHENSIVE METABOLIC PANEL
ALT: 30 U/L (ref 0–35)
AST: 22 U/L (ref 0–37)
Albumin: 4.1 g/dL (ref 3.5–5.2)
Alkaline Phosphatase: 111 U/L (ref 39–117)
BUN: 19 mg/dL (ref 6–23)
CO2: 26 mEq/L (ref 19–32)
Calcium: 9.3 mg/dL (ref 8.4–10.5)
Chloride: 106 mEq/L (ref 96–112)
Creatinine, Ser: 1.01 mg/dL (ref 0.40–1.20)
GFR: 55.83 mL/min — ABNORMAL LOW (ref >60.00)
Glucose, Bld: 86 mg/dL (ref 70–99)
Potassium: 4.3 mEq/L (ref 3.5–5.1)
Sodium: 140 mEq/L (ref 135–145)
Total Bilirubin: 0.5 mg/dL (ref 0.2–1.2)
Total Protein: 7.2 g/dL (ref 6.0–8.3)

## 2020-12-25 LAB — TSH: TSH: 1.54 u[IU]/mL (ref 0.35–5.50)

## 2020-12-25 LAB — VITAMIN D 25 HYDROXY (VIT D DEFICIENCY, FRACTURES): VITD: 36.46 ng/mL (ref 30.00–100.00)

## 2020-12-25 IMAGING — MG MM DIGITAL SCREENING BILAT W/ TOMO AND CAD
6 of 10 series · 6 of 30 positions shown · non-contrast
Comparison: Previous exam(s).

CLINICAL DATA: Screening.

EXAM:
DIGITAL SCREENING BILATERAL MAMMOGRAM WITH TOMOSYNTHESIS AND CAD
TECHNIQUE: Bilateral screening digital craniocaudal and mediolateral oblique
mammograms were obtained. Bilateral screening digital breast
tomosynthesis was performed. The images were evaluated with
computer-aided detection.

[L MLO synth-2D (1 of 2)]
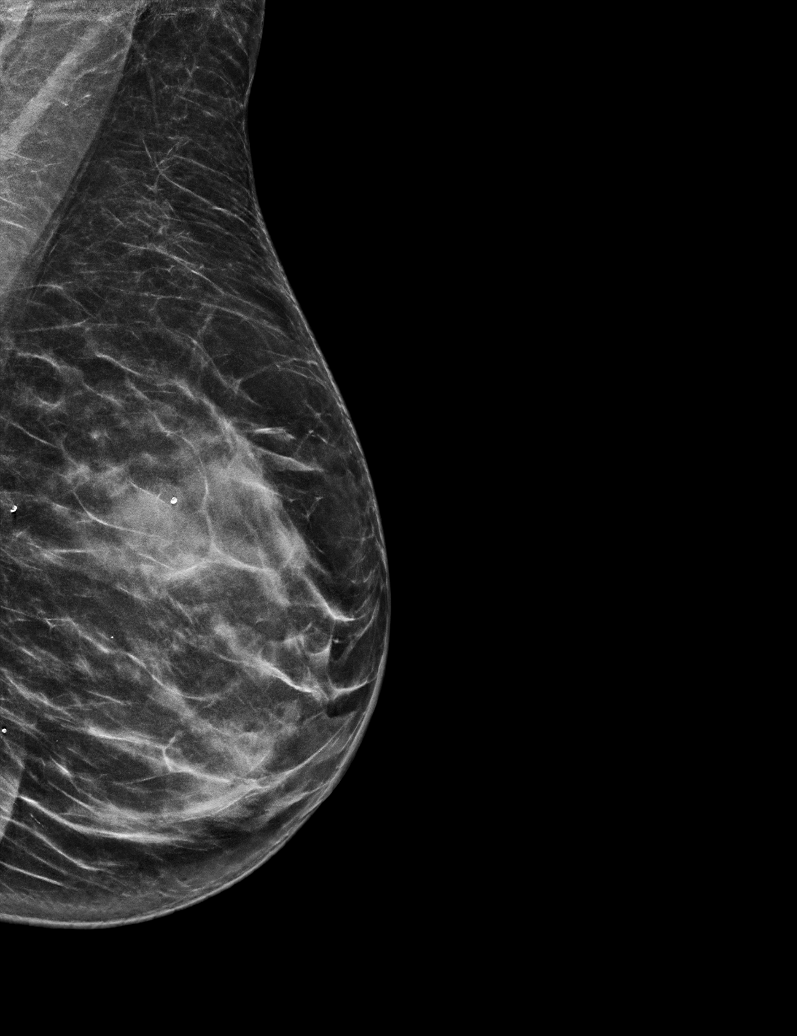

[L MLO synth-2D (2 of 2)]
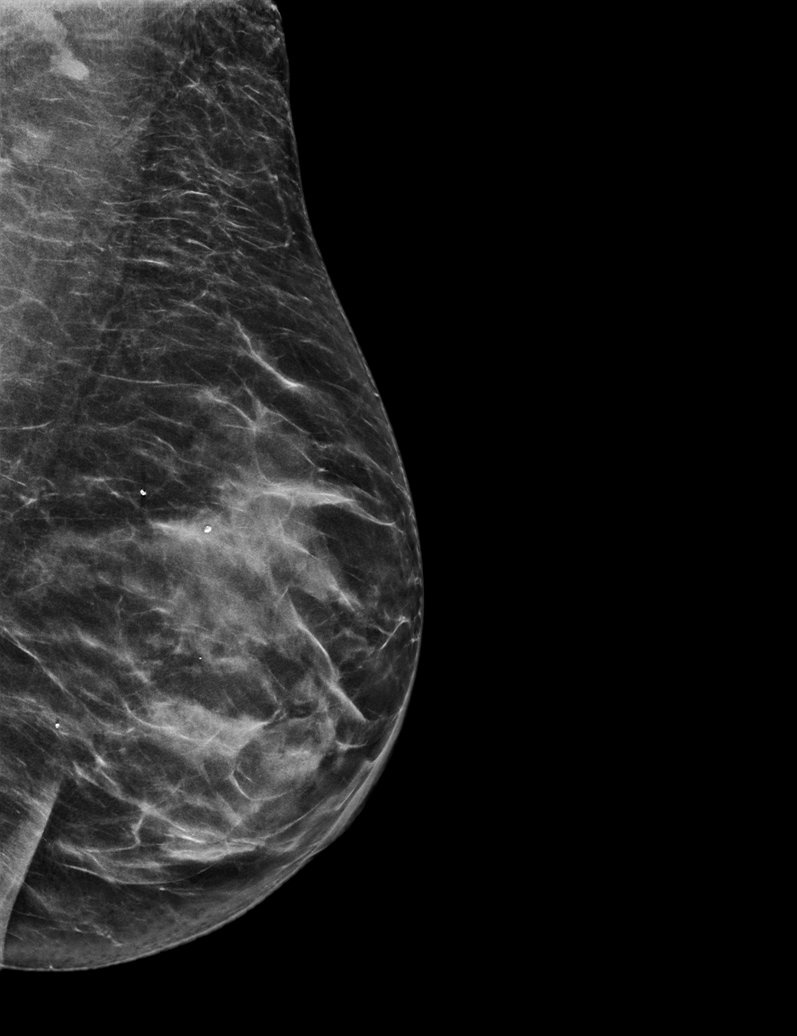

[R MLO synth-2D]
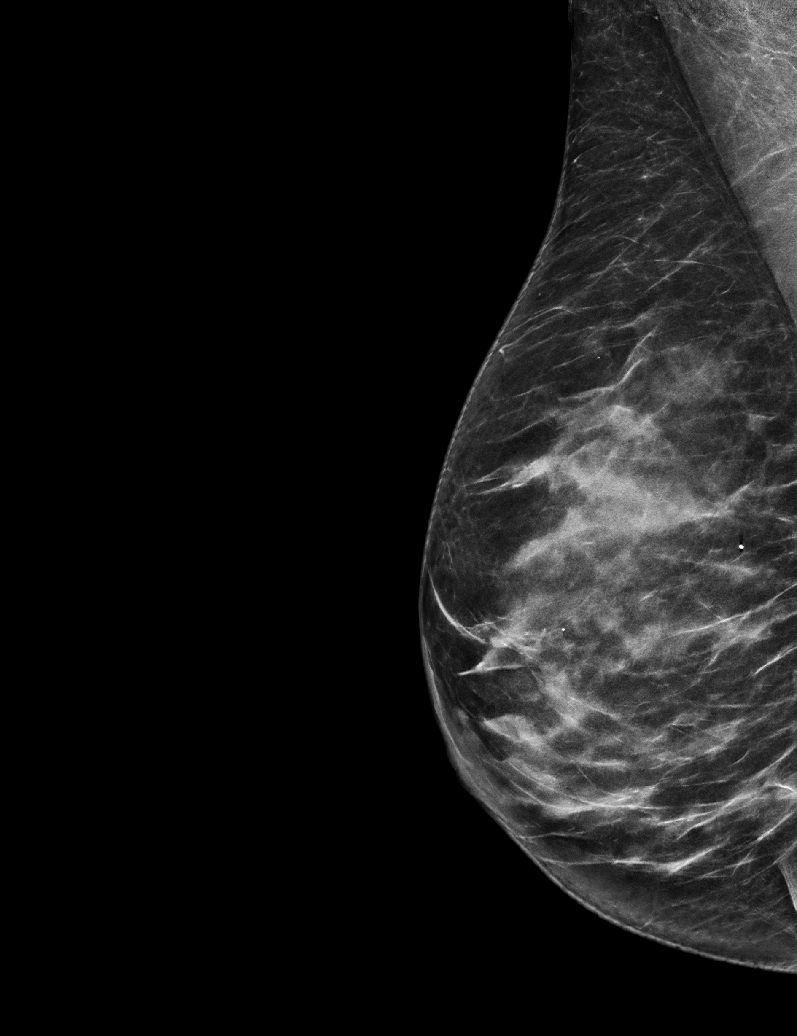

[R CC synth-2D]
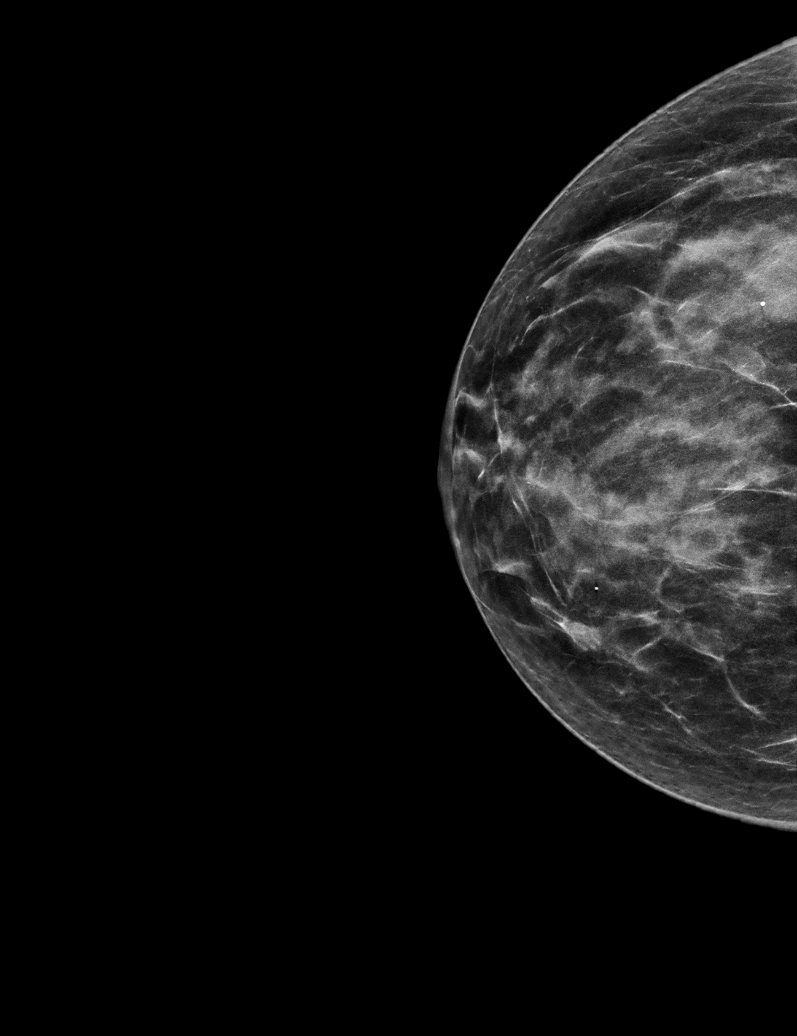

[L CC synth-2D]
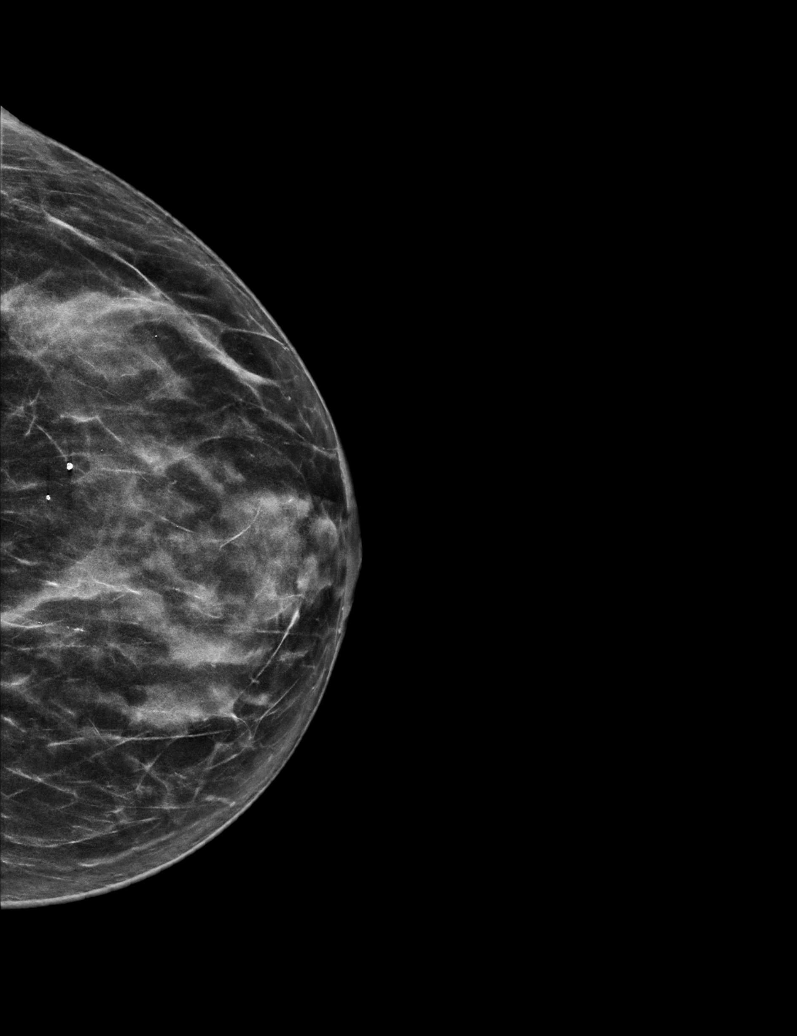

[L MLO tomo · tomo slice 29/57.0]
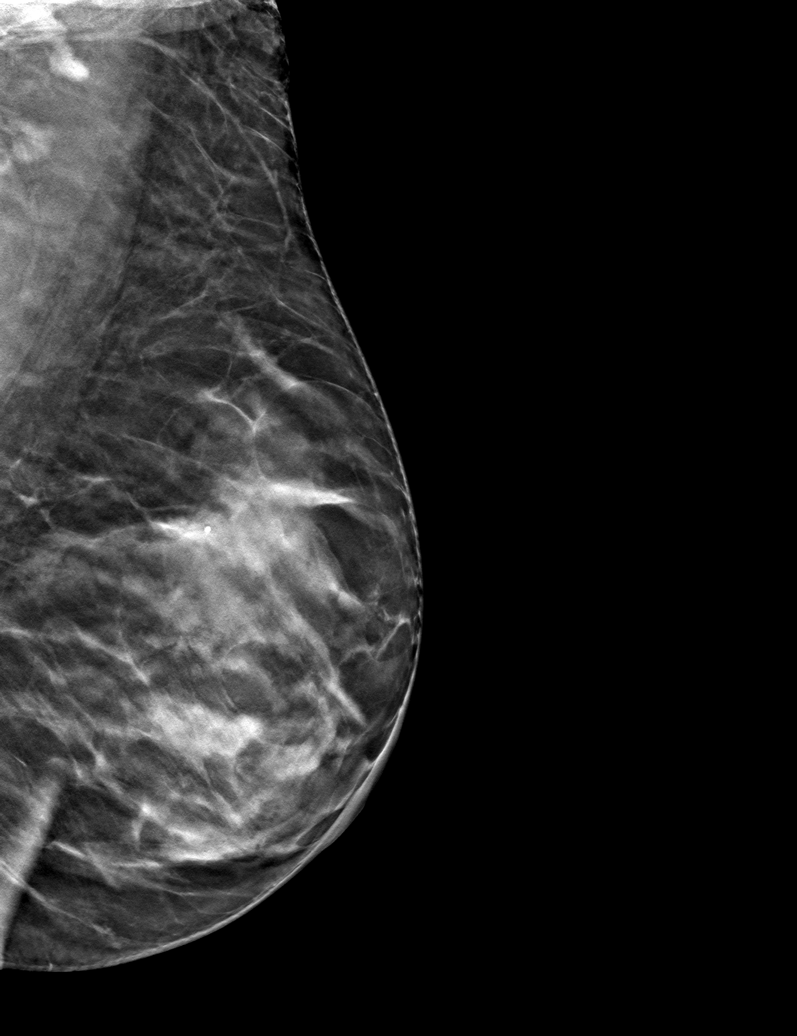

[6 of 30 positions shown; findings below may reference images not displayed]

ACR Breast Density Category c: The breast tissue is heterogeneously
dense, which may obscure small masses.
FINDINGS: In the right breast, possible distortion warrants further
evaluation. In the left breast, no findings suspicious for
malignancy.
IMPRESSION: Further evaluation is suggested for possible distortion in the right
breast.

RECOMMENDATION:
Diagnostic mammogram and possibly ultrasound of the right breast.
(Code:[I7])

The patient will be contacted regarding the findings, and additional
imaging will be scheduled.

BI-RADS CATEGORY  0: Incomplete. Need additional imaging evaluation
and/or prior mammograms for comparison.

## 2020-12-25 NOTE — Progress Notes (Signed)
Subjective:  Patient ID: Martha Wilson, female    DOB: 16-Aug-1950  Age: 70 y.o. MRN: [id number]  CC: The primary encounter diagnosis was Hyperthyroidism. Diagnoses of Vitamin D deficiency, Urge and stress incontinence, Need for immunization against influenza, Need for pneumococcal vaccination, NPH (normal pressure hydrocephalus) ([place]), Status post ventriculoperitoneal shunt, Generalized postprandial abdominal pain, Positional headache, and Tinnitus aurium, bilateral were also pertinent to this visit.  HPI [name] presents for  follow up on multiple chronic issues  This visit occurred during the SARS-CoV-2 public health emergency.  Safety protocols were in place, including screening questions prior to the visit, additional usage of staff PPE, and extensive cleaning of exam room while observing appropriate contact time as indicated for disinfecting solutions.   1) NPH:  finally diagnosed by Dr Rudene Christians during non virtual visit when he noticed her ataxic gait after years of balance symptoms.  She is s/p placement of v/p shunt which was done in Feb 2022 at [facility]  1)  post v/p shunt placement  she reports that for several months she had experienced episodes of a migratory abdominal pain that has been severe at times  and was attributed to constipation by surgeon. The pain initially occurred  postprandially 3 times  daily,  but now occurring on average every 3 days , and less severe when it  does occur .  The pain migrates  was initially in the RUQ , then LUQ,   and sometimes in the suprapubic area.  Notes it is more often occurring after large meals..  better if she eats a smaller meal.     2) She continues to have frequent  dull headaches which she was told would not resolve. Post s/p shunt.    3) Balance has improved with each adjustment of shunt .   Settings have been gradually reduced from 160 to 100.Marland KitchenSymptoms of imbalance improve right away after having the setting reduced. Has a  codman valve. Using a regular cane.   Reviewed at length all of her concerns about the V/P shunt .including the plain abdominal film that was done after placement during admission   4) she has developed urinary urge incontinence  occurring every hour ,  and INFREQUENT bowel incontinence (occurring  only when voiding  or passing gas. stools are formed) .  Treatment options were deferred to me by neurologist.  WANTS TO TRY PELVIC FLOOR TRAINING , however this has only been shown to be effective in managing stress incontinence.  She prefers to avoid medications.     5) Tinnitus:  she has been referred to ENT for  tinnitus described as "POUNDING IN MY EARS"  THAT HAS ruined listening to music and movies.   6) Fear of  dementia:  she is aggressively learning new tasks and skills/  7) Positive depression and anxiety screens:   she denies feeling depressed.  She is fatigued and has diminished appetite . Her mother just celebrated her 100th birthday and she feels very blessed and eager to return to [place] for her annual stay.    8) Insomnia:  aggravated by nocturia 2-3 at the most.  Reading material reviewed. Reads spy novels.  Occasional insomnia.  Not every  night.  Snores,  no prior sleep study. Has tried melatonin at bedtime but not at dinner time.    Outpatient Medications Prior to Visit  Medication Sig Dispense Refill   acetaminophen (TYLENOL) 500 MG tablet Take 1,000 mg by mouth every 6 (six) hours as needed for  moderate pain or headache.     aspirin EC 81 MG tablet Take 1 tablet (81 mg total) by mouth daily. Swallow whole. 30 tablet 5   atorvastatin (LIPITOR) 20 MG tablet Take 1 tablet (20 mg total) by mouth daily. 30 tablet 5   cholecalciferol (VITAMIN D3) 25 MCG (1000 UNIT) tablet Take 1,000 Units by mouth daily. (Patient not taking: Reported on 10/16/2020)     Cyanocobalamin (B-12) 2500 MCG TABS Take 2,500 mcg by mouth daily.     fluconazole (DIFLUCAN) 150 MG tablet Take 1 tablet (150 mg  total) by mouth daily. (Patient not taking: No sig reported) 2 tablet 0   Lifitegrast (XIIDRA) 5 % SOLN Place 1 drop into both eyes 2 (two) times daily.     Magnesium 400 MG CAPS Take 400 mg by mouth daily.     methimazole (TAPAZOLE) 5 MG tablet Take 2.5 mg by mouth at bedtime.     Multiple Vitamins-Minerals (PRESERVISION AREDS 2) CAPS Take 1 capsule by mouth 2 (two) times daily.     Polyvinyl Alcohol-Povidone (REFRESH OP) Place 1 drop into both eyes 2 (two) times daily as needed (dry eye). (Patient not taking: Reported on 10/16/2020)     pyridOXINE (VITAMIN B-6) 100 MG tablet Take 100 mg by mouth daily.     Turmeric 500 MG CAPS Take 500 mg by mouth daily. (Patient not taking: Reported on 10/16/2020)     No facility-administered medications prior to visit.    Review of Systems;  Patient denies headache, fevers, malaise, unintentional weight loss, skin rash, eye pain, sinus congestion and sinus pain, sore throat, dysphagia,  hemoptysis , cough, dyspnea, wheezing, chest pain, palpitations, orthopnea, edema, abdominal pain, nausea, melena, diarrhea, constipation, flank pain, dysuria, hematuria, numbness, tingling, seizures,  Focal weakness, Loss of consciousness,  Tremor, i depression, anxiety, and suicidal ideation.      Objective:  BP 122/70   Pulse 83   Ht '5\' 5"'$  (1.651 m)   Wt 133 lb (60.3 kg)   SpO2 96%   BMI 22.13 kg/m   BP Readings from Last 3 Encounters:  12/25/20 122/70  10/16/20 118/78  06/01/20 95/62    Wt Readings from Last 3 Encounters:  12/25/20 133 lb (60.3 kg)  10/16/20 132 lb (59.9 kg)  05/31/20 156 lb 12 oz (71.1 kg)    General appearance: alert, cooperative and appears stated age Ears: normal TM's and external ear canals both ears Throat: lips, mucosa, and tongue normal; teeth and gums normal Neck: no adenopathy, no carotid bruit, supple, symmetrical, trachea midline and thyroid not enlarged, symmetric, no tenderness/mass/nodules Back: symmetric, no curvature.  ROM normal. No CVA tenderness. Lungs: clear to auscultation bilaterally Heart: regular rate and rhythm, S1, S2 normal, no murmur, click, rub or gallop Abdomen: soft, non-tender; bowel sounds normal; no masses,  no organomegaly Pulses: 2+ and symmetric Skin: Skin color, texture, turgor normal. No rashes or lesions Lymph nodes: Cervical, supraclavicular, and axillary nodes normal.  Lab Results  Component Value Date   HGBA1C 5.8 11/16/2019    Lab Results  Component Value Date   CREATININE 1.01 12/25/2020   CREATININE 0.98 11/16/2019    Lab Results  Component Value Date   WBC 7.9 12/21/2019   HGB 14.7 12/21/2019   HCT 43.0 12/21/2019   PLT 291.0 12/21/2019   GLUCOSE 86 12/25/2020   CHOL 159 11/15/2020   TRIG 80 11/15/2020   HDL 72 11/15/2020   LDLCALC 71 11/15/2020   ALT 30 12/25/2020   AST  22 12/25/2020   NA 140 12/25/2020   K 4.3 12/25/2020   CL 106 12/25/2020   CREATININE 1.01 12/25/2020   BUN 19 12/25/2020   CO2 26 12/25/2020   TSH 1.54 12/25/2020   HGBA1C 5.8 11/16/2019    No results found.  Assessment & Plan:   Problem List Items Addressed This Visit       Unprioritized   Hyperthyroidism - Primary   Relevant Orders   TSH (Completed)   Comprehensive metabolic panel (Completed)   Urge and stress incontinence    Voiding every hour to avoid incontinence.  Per discussion with urology,  Pelvic floor training only helps stress incontinence.  Most of her symptoms are now urge  Secondary to NPH. . Will offer meds for OAB as these are recommended by Urology       Relevant Orders   Urinalysis, Routine w reflex microscopic (Completed)   Urine Culture   Positional headache    Secondary to NPH. Improve s/p vp shunt but not gone       NPH (normal pressure hydrocephalus) ([facility])    I provided  60 minutes of  face-to-face time during this encounter reviewing patient's diagnosis, current symptoms and past surgeries, labs and imaging studies, providing counseling on the  above mentioned problems , and coordination  of care .      Status post ventriculoperitoneal shunt    Reviewed her post procedure development of abdominal pain with Dr Allen Norris who suggested workup for intestinal angina.  Symptoms have significantly improved over the last several months.       Generalized postprandial abdominal pain    Symptoms have improved and are now occurring less than daily and only after large meals.  If symptoms escalate will send to vascular surgery for angiogram to rule out intestinal ischemia       Tinnitus aurium, bilateral    Etiology may be secondary to NPH but neurosurugery has referred her to ENT       Other Visit Diagnoses     Vitamin D deficiency       Relevant Orders   VITAMIN D 25 Hydroxy (Vit-D Deficiency, Fractures) (Completed)   Need for immunization against influenza       Relevant Orders   Flu Vaccine QUAD High Dose(Fluad) (Completed)   Need for pneumococcal vaccination       Relevant Orders   Pneumococcal conjugate vaccine 20-valent (Prevnar 20) (Completed)       I am having Diavian Kask-Robinson maintain her methimazole, PreserVision AREDS 2, aspirin EC, fluconazole, pyridOXINE, B-12, Magnesium, cholecalciferol, Turmeric, acetaminophen, Polyvinyl Alcohol-Povidone (REFRESH OP), Xiidra, and atorvastatin.  No orders of the defined types were placed in this encounter.   There are no discontinued medications.  Follow-up: No follow-ups on file.   [name], MD

## 2020-12-25 NOTE — Patient Instructions (Signed)
Try taking Beano with every lunch and dinner  Ok to gas x phasyme afterward if needed

## 2020-12-25 NOTE — Telephone Encounter (Signed)
Opened in error

## 2020-12-26 DIAGNOSIS — H9313 Tinnitus, bilateral: Secondary | ICD-10-CM | POA: Insufficient documentation

## 2020-12-26 DIAGNOSIS — R1084 Generalized abdominal pain: Secondary | ICD-10-CM | POA: Insufficient documentation

## 2020-12-26 LAB — URINE CULTURE
MICRO NUMBER:: 12391745
SPECIMEN QUALITY:: ADEQUATE

## 2020-12-26 NOTE — Assessment & Plan Note (Signed)
Etiology may be secondary to NPH but neurosurugery has referred her to ENT

## 2020-12-26 NOTE — Assessment & Plan Note (Signed)
Secondary to NPH. Improve s/p vp shunt but not gone

## 2020-12-26 NOTE — Assessment & Plan Note (Signed)
Symptoms have improved and are now occurring less than daily and only after large meals.  If symptoms escalate will send to vascular surgery for angiogram to rule out intestinal ischemia

## 2020-12-26 NOTE — Progress Notes (Signed)
Your vitamin D, thyroid , liver and kidney function are normal/stable.  You do not have a UTI.  Regarding the issues we discussed yesterday  1) spoke to urology:   Dr Erlene Quan recommends use of medications for the urge incontinence,  not pelvic floor PT as  PT  only helps stress incontinence. And the medications are safe for use. Let me know what you would like to do. 2) Dr Allen Norris (GI) recommended ruling out intestinal ischemia (lack of blood flow ( if the abdominal pain continues.  This would involve a referral to Vascular surgery,  which I am happy to do.    Regards,   Dr. Derrel Nip

## 2020-12-26 NOTE — Assessment & Plan Note (Signed)
I provided  60 minutes of  face-to-face time during this encounter reviewing patient's diagnosis, current symptoms and past surgeries, labs and imaging studies, providing counseling on the above mentioned problems , and coordination  of care .

## 2020-12-26 NOTE — Assessment & Plan Note (Signed)
Voiding every hour to avoid incontinence.  Per discussion with urology,  Pelvic floor training only helps stress incontinence.  Most of her symptoms are now urge  Secondary to NPH. . Will offer meds for OAB as these are recommended by Urology

## 2020-12-26 NOTE — Assessment & Plan Note (Signed)
Reviewed her post procedure development of abdominal pain with Dr Allen Norris who suggested workup for intestinal angina.  Symptoms have significantly improved over the last several months.

## 2020-12-29 MED ORDER — METHIMAZOLE 5 MG PO TABS: 2.5000 mg | ORAL_TABLET | Freq: Every day | ORAL | 1 refills | Status: DC

## 2021-01-01 ENCOUNTER — Other Ambulatory Visit: Payer: Self-pay | Admitting: Internal Medicine

## 2021-01-01 DIAGNOSIS — R928 Other abnormal and inconclusive findings on diagnostic imaging of breast: Secondary | ICD-10-CM

## 2021-01-04 ENCOUNTER — Ambulatory Visit
Admission: RE | Admit: 2021-01-04 | Discharge: 2021-01-04 | Disposition: A | Discharge status: Home / Self Care | Payer: Medicare HMO | Source: Ambulatory Visit | Attending: Internal Medicine | Admitting: Internal Medicine

## 2021-01-04 ENCOUNTER — Other Ambulatory Visit: Payer: Self-pay

## 2021-01-04 DIAGNOSIS — R928 Other abnormal and inconclusive findings on diagnostic imaging of breast: Secondary | ICD-10-CM | POA: Insufficient documentation

## 2021-01-04 IMAGING — US US BREAST*R* LIMITED INC AXILLA
1 series · 2 of 2 positions shown · non-contrast
Comparison: Previous exam(s).

CLINICAL DATA: Right upper breast distortion questioned on most
recent screening mammogram.

EXAM:
DIGITAL DIAGNOSTIC UNILATERAL RIGHT MAMMOGRAM WITH TOMOSYNTHESIS AND
CAD; ULTRASOUND RIGHT BREAST LIMITED
TECHNIQUE: Right digital diagnostic mammography and breast tomosynthesis was
performed. The images were evaluated with computer-aided detection.;
Targeted ultrasound examination of the right breast was performed

[Series 1: us breast*right* limited inc axilla · 0.07mm/px · 2 of 2 slices shown]
[im 1/2]
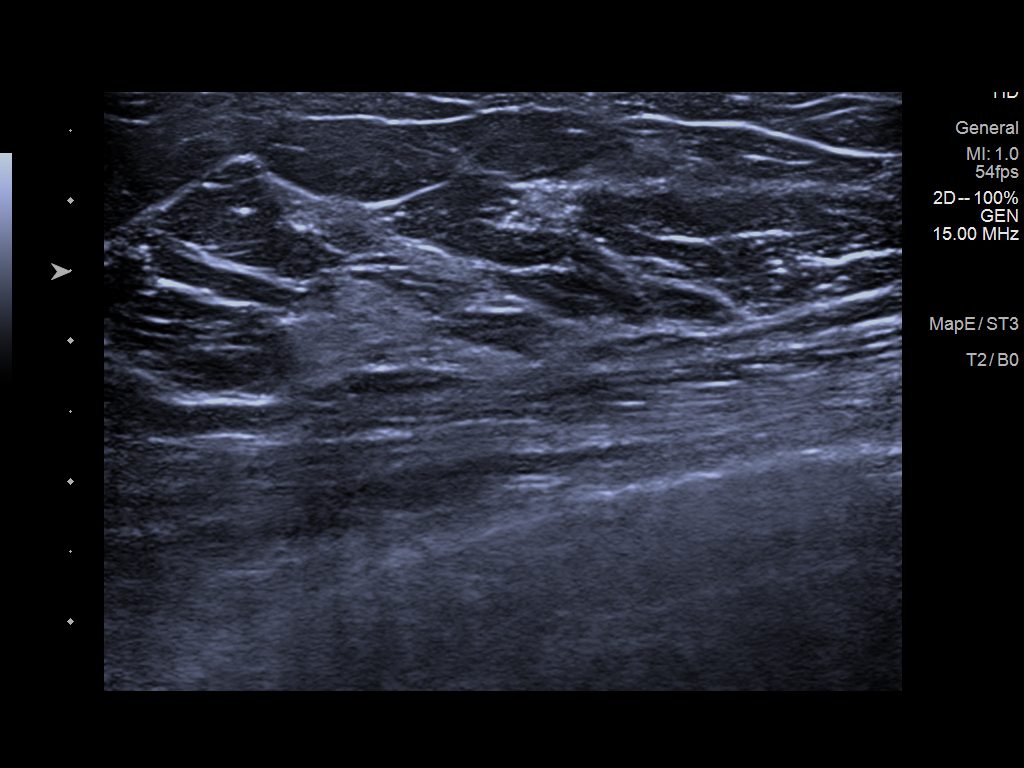
[im 2/2]
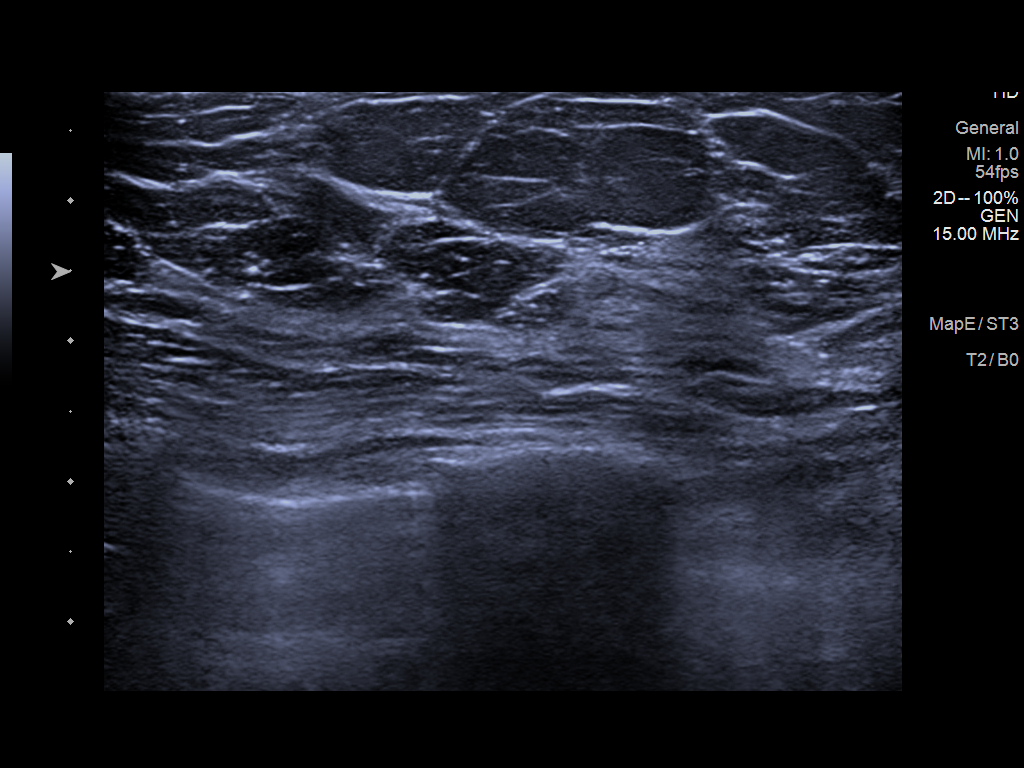

[2 of 2 positions shown; findings below may reference images not displayed]

ACR Breast Density Category c: The breast tissue is heterogeneously
dense, which may obscure small masses.
FINDINGS: Additional mammographic views of the right breast demonstrate no
suspicious masses or areas of architectural distortion. Previously
questioned distortion in the upper right breast effaces to glandular
tissue, similar appearance to patient's prior mammograms.

Targeted right upper breast ultrasound is performed demonstrating no
suspicious masses or shadowing lesions.
IMPRESSION: No mammographic or sonographic evidence of right breast malignancy.

RECOMMENDATION:
Screening mammogram in one year.(Code:[BT])

I have discussed the findings and recommendations with the patient.
If applicable, a reminder letter will be sent to the patient
regarding the next appointment.

BI-RADS CATEGORY  1: Negative.

## 2021-01-04 IMAGING — MG MM DIGITAL DIAGNOSTIC UNILAT*R* W/ TOMO W/ CAD
4 series · 4 of 12 positions shown · non-contrast
Comparison: Previous exam(s).

CLINICAL DATA: Right upper breast distortion questioned on most
recent screening mammogram.

EXAM:
DIGITAL DIAGNOSTIC UNILATERAL RIGHT MAMMOGRAM WITH TOMOSYNTHESIS AND
CAD; ULTRASOUND RIGHT BREAST LIMITED
TECHNIQUE: Right digital diagnostic mammography and breast tomosynthesis was
performed. The images were evaluated with computer-aided detection.;
Targeted ultrasound examination of the right breast was performed

[R ML synth-2D]
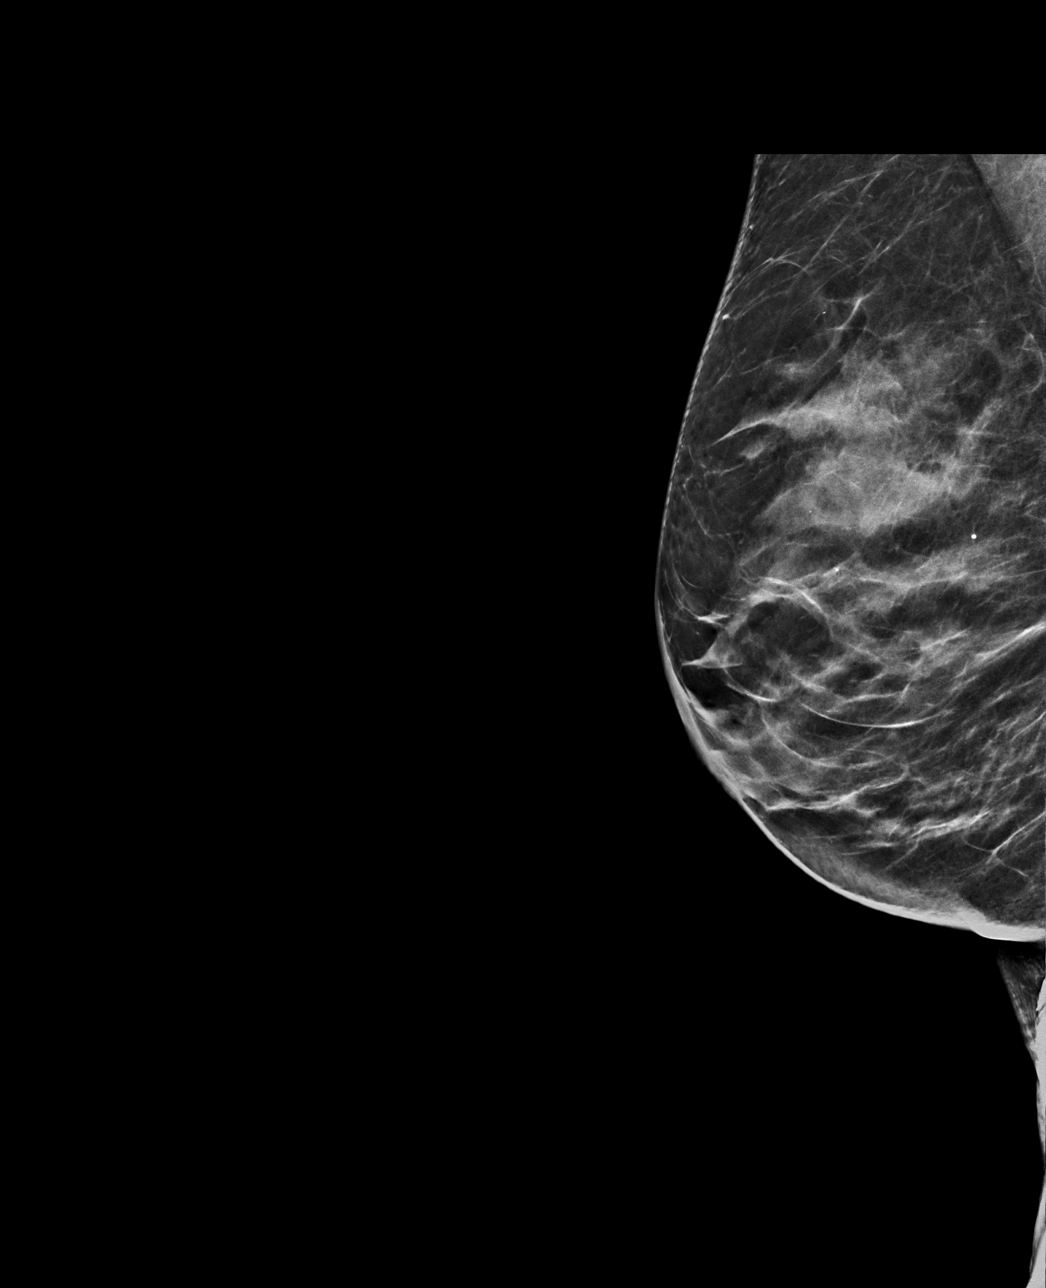

[R MLO synth-2D]
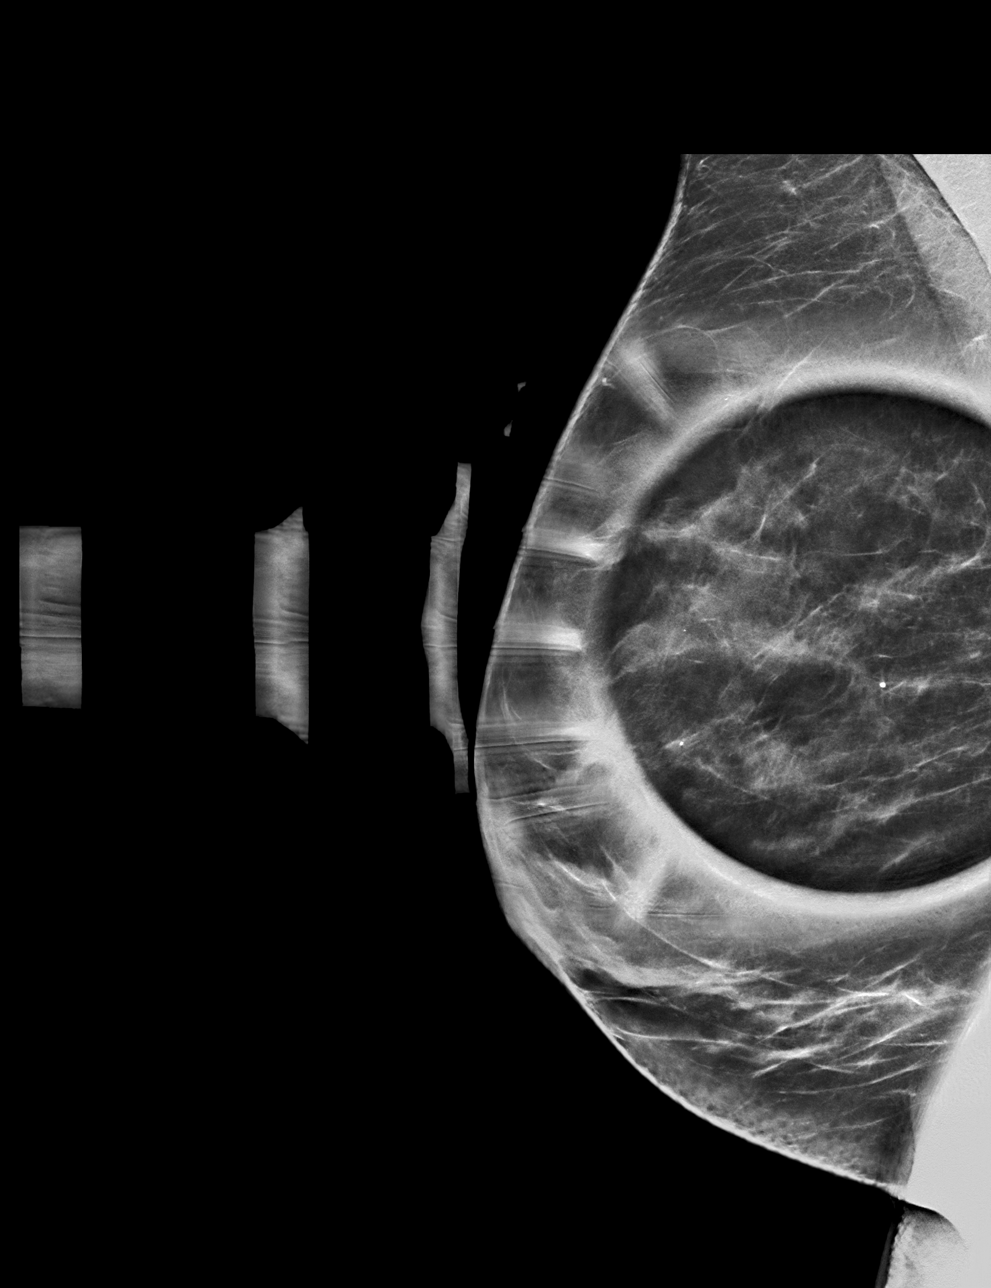

[R MLO tomo · tomo slice 27/53.0]
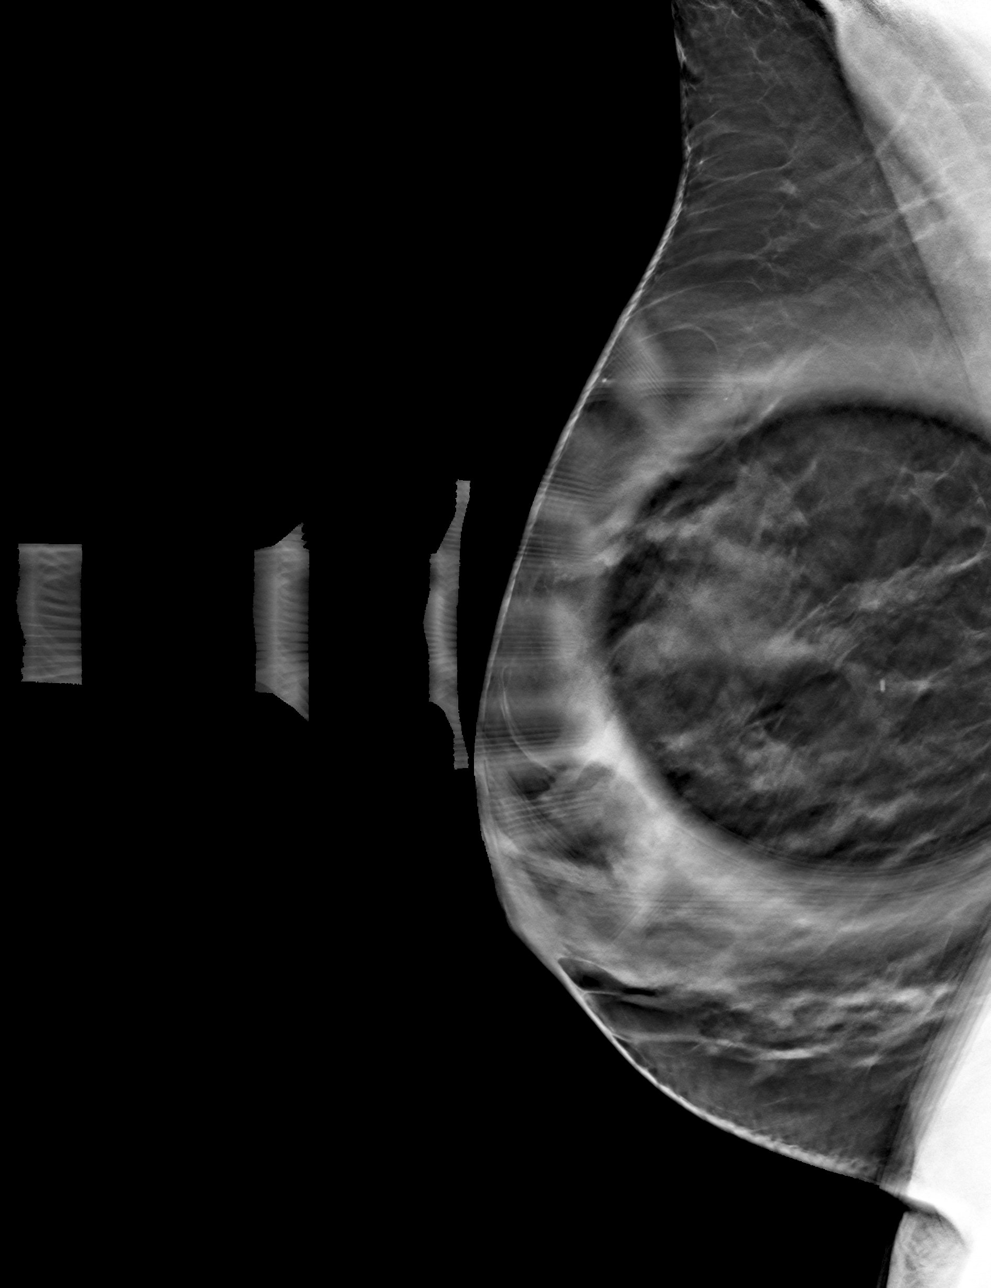

[R ML tomo · tomo slice 30/59.0]
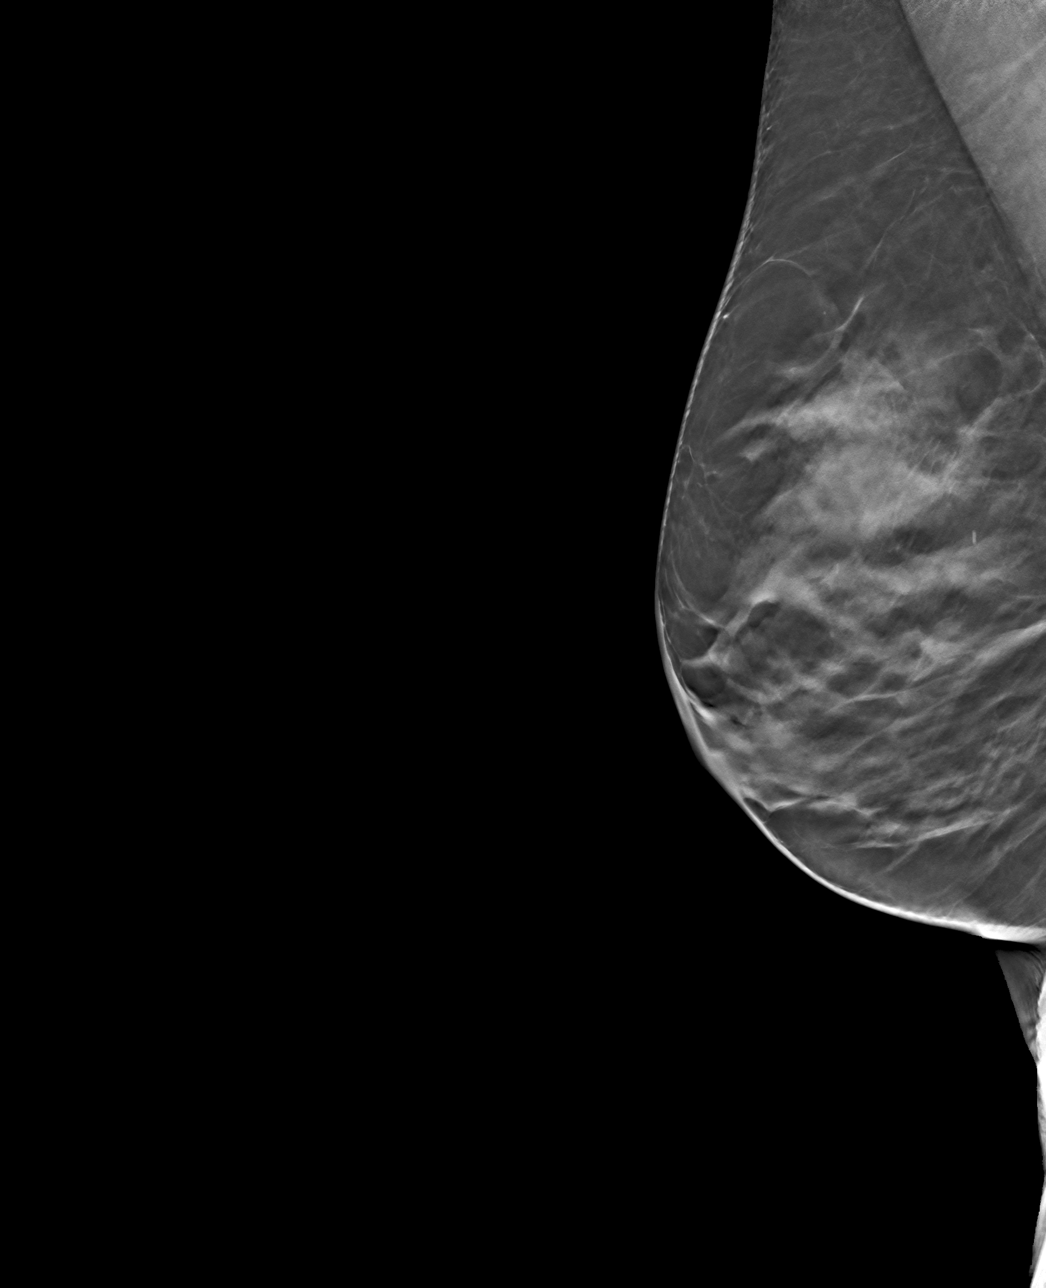

[4 of 12 positions shown; findings below may reference images not displayed]

ACR Breast Density Category c: The breast tissue is heterogeneously
dense, which may obscure small masses.
FINDINGS: Additional mammographic views of the right breast demonstrate no
suspicious masses or areas of architectural distortion. Previously
questioned distortion in the upper right breast effaces to glandular
tissue, similar appearance to patient's prior mammograms.

Targeted right upper breast ultrasound is performed demonstrating no
suspicious masses or shadowing lesions.
IMPRESSION: No mammographic or sonographic evidence of right breast malignancy.

RECOMMENDATION:
Screening mammogram in one year.(Code:[BT])

I have discussed the findings and recommendations with the patient.
If applicable, a reminder letter will be sent to the patient
regarding the next appointment.

BI-RADS CATEGORY  1: Negative.

## 2021-01-09 ENCOUNTER — Ambulatory Visit (INDEPENDENT_AMBULATORY_CARE_PROVIDER_SITE_OTHER): Payer: Medicare HMO | Admitting: Podiatry

## 2021-01-09 ENCOUNTER — Ambulatory Visit (INDEPENDENT_AMBULATORY_CARE_PROVIDER_SITE_OTHER): Payer: Medicare HMO

## 2021-01-09 ENCOUNTER — Encounter: Payer: Self-pay | Admitting: Podiatry

## 2021-01-09 ENCOUNTER — Other Ambulatory Visit: Payer: Self-pay

## 2021-01-09 DIAGNOSIS — B351 Tinea unguium: Secondary | ICD-10-CM

## 2021-01-09 DIAGNOSIS — M79675 Pain in left toe(s): Secondary | ICD-10-CM

## 2021-01-09 DIAGNOSIS — M79674 Pain in right toe(s): Secondary | ICD-10-CM | POA: Diagnosis not present

## 2021-01-09 DIAGNOSIS — I361 Nonrheumatic tricuspid (valve) insufficiency: Secondary | ICD-10-CM

## 2021-01-09 DIAGNOSIS — I2510 Atherosclerotic heart disease of native coronary artery without angina pectoris: Secondary | ICD-10-CM

## 2021-01-09 LAB — ECHOCARDIOGRAM COMPLETE
AR max vel: 2.1 cm2
AV Area VTI: 2.06 cm2
AV Area mean vel: 1.87 cm2
AV Mean grad: 2 mmHg
AV Peak grad: 5 mmHg
Ao pk vel: 1.12 m/s
Area-P 1/2: 3.89 cm2
Calc EF: 46.5 %
S' Lateral: 3.2 cm
Single Plane A2C EF: 48.8 %
Single Plane A4C EF: 45.4 %

## 2021-01-09 NOTE — Progress Notes (Signed)
Subjective:  Patient ID: Martha Wilson, female    DOB: Feb 21, 1951,  MRN: [id number]  Chief Complaint  Patient presents with   Nail Problem    Nail trim     70 y.o. female presents with the above complaint.  Patient presents with thickened elongated dystrophic toenails x10.  Pain to palpation.  They are not able to do with her themselves.  They would like for [place] to debride them down.  She does not have any secondary complaints at this time.  Review of Systems: Negative except as noted in the HPI. Denies N/V/F/Ch.  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 Swallow whole., Disp: 30 tablet, Rfl: 5   atorvastatin (LIPITOR) 20 MG tablet, Take 1 tablet (20 mg total) by mouth daily., Disp: 30 tablet, Rfl: 5   cholecalciferol (VITAMIN D3) 25 MCG (1000 UNIT) tablet, Take 1,000 Units by mouth daily. (Patient not taking: Reported on 10/16/2020), Disp: , Rfl:    Cyanocobalamin (B-12) 2500 MCG TABS, Take 2,500 mcg by mouth daily., Disp: , Rfl:    fluconazole (DIFLUCAN) 150 MG tablet, Take 1 tablet (150 mg total) by mouth daily. (Patient not taking: No sig reported), Disp: 2 tablet, Rfl: 0   Lifitegrast (XIIDRA) 5 % SOLN, Place 1 drop into both eyes 2 (two) times daily., Disp: , Rfl:    Magnesium 400 MG CAPS, Take  400 mg by mouth daily., Disp: , Rfl:    methimazole (TAPAZOLE) 5 MG tablet, Take 0.5 tablets (2.5 mg total) by mouth at bedtime., Disp: 90 tablet, Rfl: 1   Multiple Vitamins-Minerals (PRESERVISION AREDS 2) CAPS, Take 1 capsule by mouth 2 (two) times daily., Disp: , Rfl:    Polyvinyl Alcohol-Povidone (REFRESH OP), Place 1 drop into both eyes 2 (two) times daily as needed (dry eye). (Patient not taking: Reported on 10/16/2020), Disp: , Rfl:    pyridOXINE (VITAMIN B-6) 100 MG tablet, Take 100 mg by mouth daily., Disp: , Rfl:    Turmeric 500 MG CAPS, Take 500 mg by mouth daily. 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 There is no height or weight  on file to calculate BMI. Constitutional Well developed. Well nourished.  Vascular Dorsalis pedis pulses palpable bilaterally. Posterior tibial pulses palpable bilaterally. Capillary refill normal to all digits.  No cyanosis or clubbing noted. Pedal hair growth normal.  Neurologic Normal speech. Oriented to person, place, and time. Epicritic sensation to light touch grossly present bilaterally.  Dermatologic  dystrophic mycotic nail noted to the left hallux that seems to be slightly more severe than the ones listed below.  Mild pain on palpation. Nail examination shows thickened elongated dystrophic mycotic nails x10.  Pain on palpation Skin within normal limits  Orthopedic: Normal joint ROM without pain or crepitus bilaterally. No visible deformities. No bony  tenderness.   Radiographs: None Assessment:   1. Pain due to onychomycosis of toenails of both feet      Plan:  Patient was evaluated and treated and all questions answered.  Left hallux nail dystrophy -Educated the patient on the etiology of onychomycosis and various treatment options associated with improving the fungal load.  I explained to the patient that there is 3 treatment options available to treat the onychomycosis including topical, p.o., laser treatment.  Patient elected to undergo no treatment options for now.  She just wanted to get more information on the dystrophic nature of the nail.  For now we will hold off on any treatment options  Onychomycosis with pain  -Nails palliatively debrided as below. -Educated on self-care  Procedure: Nail Debridement Rationale: pain  Type of Debridement: manual, sharp debridement. Instrumentation: Nail nipper, rotary burr. Number of Nails: 10  Procedures and Treatment: Consent by patient was obtained for treatment procedures. The patient understood the discussion of treatment and procedures well. All questions were answered thoroughly reviewed. Debridement of mycotic and hypertrophic toenails, 1 through 5 bilateral and clearing of subungual debris. No ulceration, no infection noted.  Return Visit-Office Procedure: Patient instructed to return to the office for a follow up visit 3 months for continued evaluation and treatment.  Boneta Lucks, DPM    No follow-ups on file.   No follow-ups on file.

## 2021-01-16 ENCOUNTER — Telehealth: Payer: Self-pay

## 2021-01-16 NOTE — Telephone Encounter (Signed)
-----   Message from Kate Sable, MD sent at 01/10/2021  5:13 PM EDT ----- Echocardiogram showing mildly reduced ejection fraction, mild to moderate tricuspid regurgitation.  Continue medications as prescribed, keep follow-up appointment.

## 2021-01-16 NOTE — Telephone Encounter (Signed)
Patient called back and I gave her the echo results as documented below and sent them through he MyChart as well.

## 2021-03-13 ENCOUNTER — Ambulatory Visit: Payer: Medicare HMO | Admitting: Podiatry

## 2021-03-22 ENCOUNTER — Ambulatory Visit: Payer: Medicare HMO | Admitting: Podiatry

## 2021-03-22 ENCOUNTER — Encounter: Payer: Self-pay | Admitting: Podiatry

## 2021-03-22 ENCOUNTER — Other Ambulatory Visit: Payer: Self-pay

## 2021-03-22 DIAGNOSIS — M79675 Pain in left toe(s): Secondary | ICD-10-CM | POA: Diagnosis not present

## 2021-03-22 DIAGNOSIS — B351 Tinea unguium: Secondary | ICD-10-CM | POA: Diagnosis not present

## 2021-03-22 DIAGNOSIS — M79674 Pain in right toe(s): Secondary | ICD-10-CM | POA: Diagnosis not present

## 2021-03-22 NOTE — Progress Notes (Signed)
Subjective:  Patient ID: Martha Wilson, female    DOB: December 31, 1950,  MRN: [id number]  Chief Complaint  Patient presents with   Nail Problem    Nail trim     70 y.o. female presents with the above complaint.  Patient presents with thickened elongated dystrophic toenails x10.  Pain to palpation.  They are not able to do with her themselves.  They would like for [place] to debride them down.  She does not have any secondary complaints at this time.  Review of Systems: Negative except as noted in the HPI. Denies N/V/F/Ch.  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 Swallow whole., Disp: 30 tablet, Rfl: 5   atorvastatin (LIPITOR) 20 MG tablet, Take 1 tablet (20 mg total) by mouth daily., Disp: 30 tablet, Rfl: 5   cholecalciferol (VITAMIN D3) 25 MCG (1000 UNIT) tablet, Take 1,000 Units by mouth daily. (Patient not taking: Reported on 10/16/2020), Disp: , Rfl:    Cyanocobalamin (B-12) 2500 MCG TABS, Take 2,500 mcg by mouth daily., Disp: , Rfl:    fluconazole (DIFLUCAN) 150 MG tablet, Take 1 tablet (150 mg total) by mouth daily. (Patient not taking: No sig reported), Disp: 2 tablet, Rfl: 0   Lifitegrast (XIIDRA) 5 % SOLN, Place 1 drop into both eyes 2 (two) times daily., Disp: , Rfl:    Magnesium 400 MG CAPS, Take  400 mg by mouth daily., Disp: , Rfl:    methimazole (TAPAZOLE) 5 MG tablet, Take 0.5 tablets (2.5 mg total) by mouth at bedtime., Disp: 90 tablet, Rfl: 1   Multiple Vitamins-Minerals (PRESERVISION AREDS 2) CAPS, Take 1 capsule by mouth 2 (two) times daily., Disp: , Rfl:    Polyvinyl Alcohol-Povidone (REFRESH OP), Place 1 drop into both eyes 2 (two) times daily as needed (dry eye). (Patient not taking: Reported on 10/16/2020), Disp: , Rfl:    pyridOXINE (VITAMIN B-6) 100 MG tablet, Take 100 mg by mouth daily., Disp: , Rfl:    Turmeric 500 MG CAPS, Take 500 mg by mouth daily. 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 There is no height or weight  on file to calculate BMI. Constitutional Well developed. Well nourished.  Vascular Dorsalis pedis pulses palpable bilaterally. Posterior tibial pulses palpable bilaterally. Capillary refill normal to all digits.  No cyanosis or clubbing noted. Pedal hair growth normal.  Neurologic Normal speech. Oriented to person, place, and time. Epicritic sensation to light touch grossly present bilaterally.  Dermatologic  dystrophic mycotic nail noted to the left hallux that seems to be slightly more severe than the ones listed below.  Mild pain on palpation. Nail examination shows thickened elongated dystrophic mycotic nails x10.  Pain on palpation Skin within normal limits  Orthopedic: Normal joint ROM without pain or crepitus bilaterally. No visible deformities. No bony  tenderness.   Radiographs: None Assessment:   1. Pain due to onychomycosis of toenails of both feet       Plan:  Patient was evaluated and treated and all questions answered.  Left hallux nail dystrophy -Educated the patient on the etiology of onychomycosis and various treatment options associated with improving the fungal load.  I explained to the patient that there is 3 treatment options available to treat the onychomycosis including topical, p.o., laser treatment.  Patient elected to undergo no treatment options for now.  She just wanted to get more information on the dystrophic nature of the nail.  For now we will hold off on any treatment options  Onychomycosis with pain  -Nails palliatively debrided as below. -Educated on self-care  Procedure: Nail Debridement Rationale: pain  Type of Debridement: manual, sharp debridement. Instrumentation: Nail nipper, rotary burr. Number of Nails: 10  Procedures and Treatment: Consent by patient was obtained for treatment procedures. The patient understood the discussion of treatment and procedures well. All questions were answered thoroughly reviewed. Debridement of mycotic and hypertrophic toenails, 1 through 5 bilateral and clearing of subungual debris. No ulceration, no infection noted.  Return Visit-Office Procedure: Patient instructed to return to the office for a follow up visit 3 months for continued evaluation and treatment.  Boneta Lucks, DPM    No follow-ups on file.   No follow-ups on file.

## 2021-04-16 ENCOUNTER — Encounter: Payer: Self-pay | Admitting: Cardiology

## 2021-04-16 ENCOUNTER — Other Ambulatory Visit: Payer: Self-pay

## 2021-04-16 ENCOUNTER — Ambulatory Visit (INDEPENDENT_AMBULATORY_CARE_PROVIDER_SITE_OTHER): Payer: Medicare HMO | Admitting: Cardiology

## 2021-04-16 VITALS — BP 98/68 | HR 69 | Ht 65.5 in | Wt 132.0 lb

## 2021-04-16 DIAGNOSIS — I361 Nonrheumatic tricuspid (valve) insufficiency: Secondary | ICD-10-CM | POA: Diagnosis not present

## 2021-04-16 DIAGNOSIS — I2510 Atherosclerotic heart disease of native coronary artery without angina pectoris: Secondary | ICD-10-CM

## 2021-04-16 DIAGNOSIS — I429 Cardiomyopathy, unspecified: Secondary | ICD-10-CM

## 2021-04-16 MED ORDER — IVABRADINE HCL 7.5 MG PO TABS: 15.0000 mg | ORAL_TABLET | Freq: Once | ORAL | 0 refills | Status: AC

## 2021-04-16 NOTE — Progress Notes (Signed)
Cardiology Office Note:    Date:  04/16/2021   ID:  Martha Wilson, DOB 03-18-1951, MRN [id number]  PCP:  Martha Mc, MD  [facility] Cardiologist:  None  [facility] Electrophysiologist:  None   Referring MD: Martha Mc, MD   Chief Complaint  Patient presents with   Other    6 month follow up -- Meds reviewed verbally with patient.     History of Present Illness:    Martha Wilson is a 71 y.o. 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    Patient with history of tricuspid valve regurgitation, underwent repeat echocardiogram to evaluate tricuspid valve function.  Echo did not show mild to moderate tricuspid valve regurgitation, EF mildly reduced at 45%.  She states feeling well, denies chest pain, shortness of breath, edema.  Takes aspirin 81 mg, Lipitor.   Prior notes Echo 01/2021, EF 40 to 45%, mild to moderate TR left heart cath August 2014 20% proximal LAD lesion, mild diffuse disease in RCA.   Apparently has a history of tricuspid valve prolapse with mild RV dilatation.   Outside echo report 01/2020 showed normal systolic function, EF 40%.  Moderately enlarged RV, RV normal systolic function, RVSP 32 mmHg. Previous cardiologist was in [place].  She still gets yearly echocardiogram for tricuspid valve insufficiency with serial echocardiograms.  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   by Martha Wilson for NPH   Tricuspid valve prolapse    Wears dentures    partial upper and lower    Past Surgical History:   Procedure Laterality Date   APPLICATION OF CRANIAL NAVIGATION N/A 05/31/2020   Procedure: APPLICATION OF CRANIAL NAVIGATION;  Surgeon: Martha Maw, MD;  Location: [facility];  Service: Neurosurgery;  Laterality: N/A;   BREAST BIOPSY  1980's   pt states she had a bx in the 80's, not sure of side, benign   breat biopsy     CATARACT EXTRACTION W/PHACO Right 09/22/2018   Procedure: CATARACT EXTRACTION PHACO AND INTRAOCULAR LENS PLACEMENT ([place])  RIGHT panooptix;  Surgeon: Martha Robson, MD;  Location: [place];  Service: Ophthalmology;  Laterality: Right;   CATARACT EXTRACTION W/PHACO Left 10/06/2018   Procedure: CATARACT EXTRACTION PHACO AND INTRAOCULAR LENS PLACEMENT ([place]) LEFT PANOPTIX LENS;  Surgeon: Martha Robson, MD;  Location: [place];  Service: Ophthalmology;  Laterality: Left;   COLONOSCOPY     COLONOSCOPY WITH PROPOFOL N/A 11/19/2018   Procedure: COLONOSCOPY WITH PROPOFOL;  Surgeon: Martha Bellows, MD;  Location: [facility];  Service: Gastroenterology;  Laterality: N/A;   ORIF FOOT FRACTURE     VENTRICULOPERITONEAL SHUNT Right 05/31/2020   Procedure: SHUNT INSERTION VENTRICULAR-PERITONEAL;  Surgeon: Martha Maw, MD;  Location: [facility];  Service: Neurosurgery;  Laterality: Right;  Dr Lysle Pearl to assist    Current Medications: Current Meds  Medication Sig   acetaminophen (TYLENOL) 500 MG tablet Take 1,000 mg by mouth every 6 (six) hours as needed for moderate pain or headache.   aspirin  EC 81 MG tablet Take 1 tablet (81 mg total) by mouth daily. Swallow whole.   atorvastatin (LIPITOR) 20 MG tablet Take 1 tablet (20 mg total) by mouth daily.   Cyanocobalamin (B-12) 2500 MCG TABS Take 2,500 mcg by mouth daily.   ivabradine (CORLANOR) 7.5 MG TABS tablet Take 2 tablets (15 mg total) by mouth once for 1 dose. Take 2 hours prior to your CT scan.   Lifitegrast (XIIDRA) 5 % SOLN Place 1 drop into both eyes 2 (two) times daily.   methimazole (TAPAZOLE) 5 MG  tablet Take 0.5 tablets (2.5 mg total) by mouth at bedtime.   Multiple Vitamins-Minerals (PRESERVISION AREDS 2) CAPS Take 1 capsule by mouth 2 (two) times daily.   Polyvinyl Alcohol-Povidone (REFRESH OP) Place 1 drop into both eyes 2 (two) times daily as needed (dry eye).     Allergies:   Bee venom, Latex, Penicillins, and Adhesive [tape]   Social History   Socioeconomic History   Marital status: Single    Spouse name: Not on file   Number of children: Not on file   Years of education: Not on file   Highest education level: Not on file  Occupational History   Not on file  Tobacco Use   Smoking status: Former    Packs/day: 0.00    Years: 35.00    Pack years: 0.00    Types: Cigarettes   Smokeless tobacco: Never   Tobacco comments:    "occasional" cigarette  Vaping Use   Vaping Use: Never used  Substance and Sexual Activity   Alcohol use: Yes    Alcohol/week: 3.0 standard drinks    Types: 3 Glasses of wine per week    Comment: OCC   Drug use: Never   Sexual activity: Not Currently  Other Topics Concern   Not on file  Social History Narrative   Not on file   Social Determinants of Health   Financial Resource Strain: Not on file  Food Insecurity: Not on file  Transportation Needs: Not on file  Physical Activity: Not on file  Stress: Not on file  Social Connections: Not on file     Family History: The patient's family history includes Early death in her brother; Heart attack in her father; Thyroid disease in her brother.  ROS:   Please see the history of present illness.     All other systems reviewed and are negative.  EKGs/Labs/Other Studies Reviewed:    The following studies were reviewed today: Echocardiogram 01/10/2020   1. Normal left ventricular chamber size. Normal wall motion and systolic  function.  The biplane LVEF is 63%.    2. Moderately enlarged right ventricle with normal systolic function.    3. The tricuspid valve is not well-visualized.   There is an eccentric jet of  tricuspid regurgitation directed towards the septum.  Visually, it only  appears mild in severity but may be underestimated due to incomplete  visualization.    4. The estimated right ventricular systolic pressure is 32 mmHg.    5. Inferior vena cava is not well visualized but appears normal in  dimension.  The hepatic veins were not visualized.    6. When directly compared to the prior study from 01/11/2019, the TR appears  similar in severity.    EKG:  EKG is  ordered today.  The ekg ordered today demonstrates normal sinus rhythm  Recent Labs: 12/25/2020: ALT 30; BUN 19; Creatinine, Ser 1.01; Potassium 4.3; Sodium 140; TSH 1.54  Recent Lipid Panel    Component Value Date/Time   CHOL 159 11/15/2020 0941   TRIG 80 11/15/2020 0941   HDL 72 11/15/2020 0941   CHOLHDL 2.2 11/15/2020 0941   VLDL 16 11/15/2020 0941   LDLCALC 71 11/15/2020 0941     Risk Assessment/Calculations:      Physical Exam:    VS:  BP 98/68 (BP Location: Left Arm, Patient Position: Sitting, Cuff Size: Normal)    Pulse 69    Ht 5' 5.5" (1.664 m)    Wt 132 lb (59.9 kg)    SpO2 98%    BMI 21.63 kg/m     Wt Readings from Last 3 Encounters:  04/16/21 132 lb (59.9 kg)  12/25/20 133 lb (60.3 kg)  10/16/20 132 lb (59.9 kg)     GEN:  Well nourished, well developed in no acute distress HEENT: Normal NECK: No JVD; No carotid bruits LYMPHATICS: No lymphadenopathy CARDIAC: RRR, no murmurs, rubs, gallops RESPIRATORY:  Clear to auscultation without rales, wheezing or rhonchi  ABDOMEN: Soft, non-tender, non-distended MUSCULOSKELETAL:  No edema; No deformity  SKIN: Warm and dry NEUROLOGIC:  Alert and oriented x 3 PSYCHIATRIC:  Normal affect   ASSESSMENT:    1. Coronary artery disease involving native coronary artery of native heart without angina pectoris   2. Nonrheumatic tricuspid valve regurgitation   3. Cardiomyopathy, unspecified type ([place])      PLAN:    In order of  problems listed above:  Nonobstructive CAD, minimal diffuse RCA, 20% proximal LAD.  Denies chest pain.  Continue aspirin 81 mg, Lipitor 20 mg daily.  Cholesterol adequately controlled. Mild to moderate tricuspid regurgitation, continue monitoring TR with frequent echoes.  Mildly reduced EF.  Mildly reduced ejection fraction, obtain coronary CTA to evaluate ischemic cause.  Low blood pressures preventing use of GDMT at this time.  Follow-up in 1 month   Medication Adjustments/Labs and Tests Ordered: Current medicines are reviewed at length with the patient today.  Concerns regarding medicines are outlined above.  Orders Placed This Encounter  Procedures   CT CORONARY MORPH W/CTA COR W/SCORE W/CA W/CM &/OR WO/CM   Basic metabolic panel   EKG 16-RCVE    Meds ordered this encounter  Medications   ivabradine (CORLANOR) 7.5 MG TABS tablet    Sig: Take 2 tablets (15 mg total) by mouth once for 1 dose. Take 2 hours prior to your CT scan.    Dispense:  2 tablet    Refill:  0           Signed, Kate Sable, MD  04/16/2021 1:15 PM    [facility]

## 2021-04-16 NOTE — Patient Instructions (Signed)
Medication Instructions:   Your physician recommends that you continue on your current medications as directed. Please refer to the Current Medication list given to you today.  *If you need a refill on your cardiac medications before your next appointment, please call your pharmacy*   Lab Work:  BMP drawn in office  If you have labs (blood work) drawn today and your tests are completely normal, you will receive your results only by: [place] (if you have MyChart) OR A paper copy in the mail If you have any lab test that is abnormal or we need to change your treatment, we will call you to review the results.   Testing/Procedures:  Your physician has requested that you have cardiac CT. Cardiac computed tomography (CT) is a painless test that uses an x-ray machine to take clear, detailed pictures of your heart.    Your cardiac CT will be scheduled at:  [facility] [address] [place], [place] [address] [phone]  Please arrive 15 mins early for check-in and test prep.    Please follow these instructions carefully (unless otherwise directed):    On the Night Before the Test: Be sure to Drink plenty of water. Do not consume any caffeinated/decaffeinated beverages or chocolate 12 hours prior to your test.    On the Day of the Test: Drink plenty of water until 1 hour prior to the test. Do not eat any food 4 hours prior to the test. You may take your regular medications prior to the test.  Ivabradine (Corlanor) two hours prior to test. FEMALES- please wear underwire-free bra if available, avoid dresses & tight clothing        After the Test: Drink plenty of water. After receiving IV contrast, you may experience a mild flushed feeling. This is normal. On occasion, you may experience a mild rash up to 24 hours after the test. This is not dangerous. If this occurs, you can take Benadryl 25 mg and increase your fluid  intake. If you experience trouble breathing, this can be serious. If it is severe call 911 IMMEDIATELY. If it is mild, please call our office. If you take any of these medications: Glipizide/Metformin, Avandament, Glucavance, please do not take 48 hours after completing test unless otherwise instructed.  Please allow 2-4 weeks for scheduling of routine cardiac CTs. Some insurance companies require a pre-authorization which may delay scheduling of this test.   For non-scheduling related questions, please contact the cardiac imaging nurse navigator should you have any questions/concerns: Marchia Bond, Cardiac Imaging Nurse Navigator Gordy Clement, Cardiac Imaging Nurse Navigator [facility] Direct Office Dial: [phone]   For scheduling needs, including cancellations and rescheduling, please call [place], [phone].     Follow-Up: At [facility], you and your health needs are our priority.  As part of our continuing mission to provide you with exceptional heart care, we have created designated Provider Care Teams.  These Care Teams include your primary Cardiologist (physician) and Advanced Practice Providers (APPs -  Physician Assistants and Nurse Practitioners) who all work together to provide you with the care you need, when you need it.  We recommend signing up for the patient portal called "MyChart".  Sign up information is provided on this After Visit Summary.  MyChart is used to connect with patients for Virtual Visits (Telemedicine).  Patients are able to view lab/test results, encounter notes, upcoming appointments, etc.  Non-urgent messages can be sent to your  provider as well.   To learn more about what you can do with MyChart, go to [email or web address].    Your next appointment:   1 month(s)  The format for your next appointment:   In Person  Provider:   You may see Kate Sable, MD or one of the following Advanced Practice  Providers on your designated Care Team:   Murray Hodgkins, NP Christell Faith, PA-C Cadence Kathlen Mody, [place]    Other Instructions

## 2021-04-17 LAB — BASIC METABOLIC PANEL
BUN/Creatinine Ratio: 20 (ref 12–28)
BUN: 18 mg/dL (ref 8–27)
CO2: 22 mmol/L (ref 20–29)
Calcium: 9.4 mg/dL (ref 8.7–10.3)
Chloride: 104 mmol/L (ref 96–106)
Creatinine, Ser: 0.92 mg/dL (ref 0.57–1.00)
Glucose: 96 mg/dL (ref 70–99)
Potassium: 4.7 mmol/L (ref 3.5–5.2)
Sodium: 140 mmol/L (ref 134–144)
eGFR: 66 mL/min/{1.73_m2} (ref >59)

## 2021-04-20 ENCOUNTER — Telehealth (HOSPITAL_COMMUNITY): Payer: Self-pay | Admitting: Emergency Medicine

## 2021-04-20 NOTE — Telephone Encounter (Signed)
Reaching out to patient to offer assistance regarding upcoming cardiac imaging study; pt verbalizes understanding of appt date/time, parking situation and where to check in, pre-test NPO status and medications ordered, and verified current allergies; name and call back number provided for further questions should they arise Martha Bond RN Navigator Cardiac Imaging [facility] [phone] office [phone] cell  Arrival 1215 15mg  ivabradine  Denies iv issues

## 2021-04-23 ENCOUNTER — Other Ambulatory Visit: Payer: Self-pay

## 2021-04-23 ENCOUNTER — Ambulatory Visit
Admission: RE | Admit: 2021-04-23 | Discharge: 2021-04-23 | Disposition: A | Discharge status: Home / Self Care | Payer: Medicare HMO | Source: Ambulatory Visit | Attending: Cardiology | Admitting: Cardiology

## 2021-04-23 DIAGNOSIS — I429 Cardiomyopathy, unspecified: Secondary | ICD-10-CM | POA: Insufficient documentation

## 2021-04-23 DIAGNOSIS — I2510 Atherosclerotic heart disease of native coronary artery without angina pectoris: Secondary | ICD-10-CM | POA: Insufficient documentation

## 2021-04-23 IMAGING — CT CT HEART MORP W/ CTA COR W/ SCORE W/ CA W/CM &/OR W/O CM
2 of 11 series · 7 of 20 positions shown, 8 images · non-contrast
Comparison: None.

Addendum:
CLINICAL DATA: Chest pain

EXAM:
Cardiac/Coronary  CTA
TECHNIQUE: The patient was scanned on a Siemens Somatom go.Top scanner.

[Series 25: multiphase % cta coronary 0.60 · axial · 0.33mm/px · z∈[-1133,-1058]mm · 4 of 3421 slices shown, 5 images]
[im 685/3421  vessel]
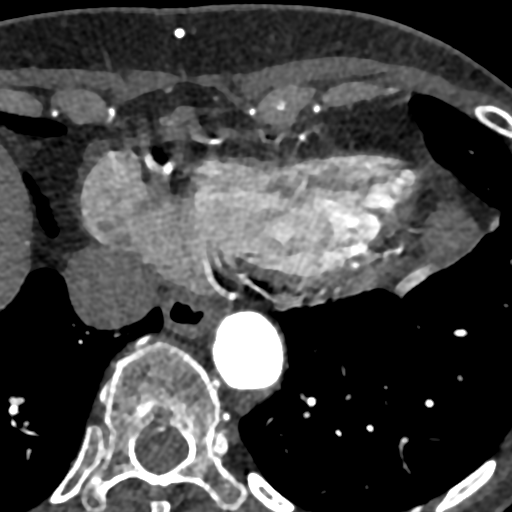
[im 685/3421  lung]
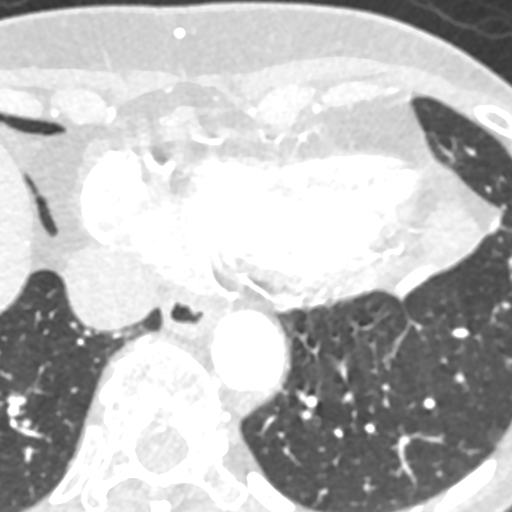
[im 1369/3421  vessel]
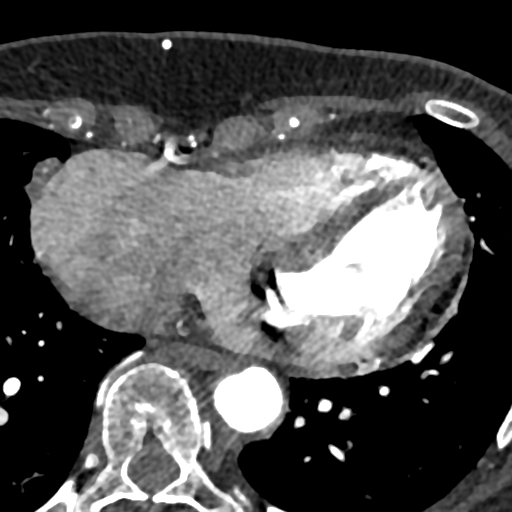
[im 2053/3421  vessel]
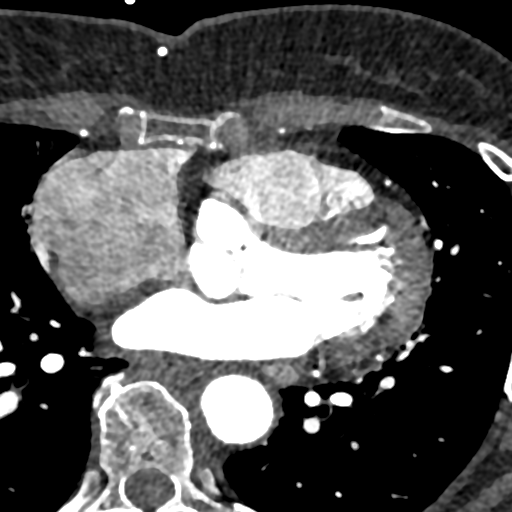
[im 2737/3421  vessel]
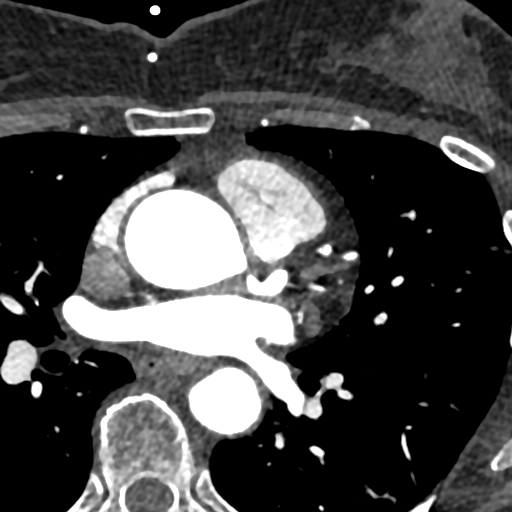

[Series 33: ms multiphase cta coronary 0.60 · axial · 0.33mm/px · z∈[-1127,-1064]mm · 3 of 2799 slices shown]
[im 700/2799  vessel]
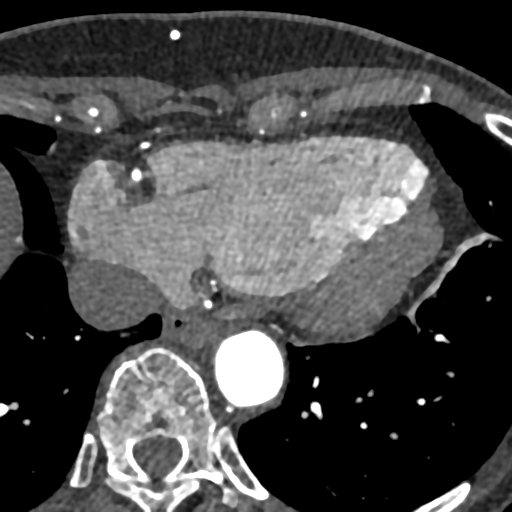
[im 1400/2799  vessel]
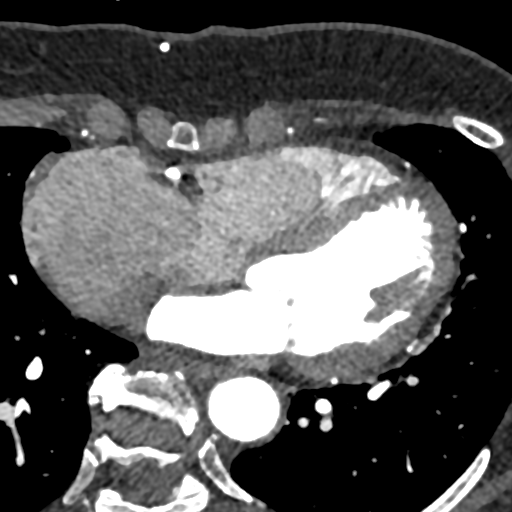
[im 2099/2799  vessel]
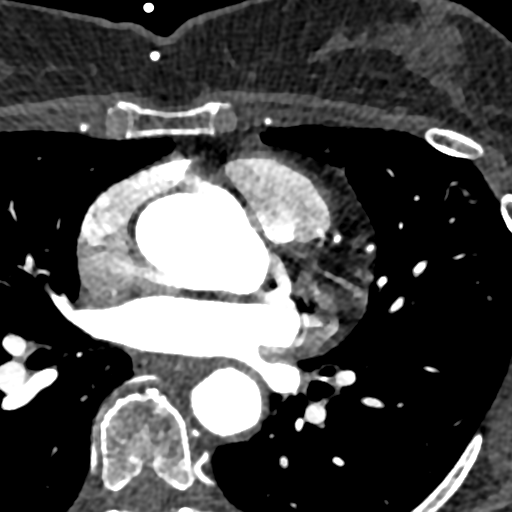

[7 of 20 positions shown; findings below may reference images not displayed]

:
A retrospective scan was triggered in the descending thoracic aorta.
Axial non-contrast 3 mm slices were carried out through the heart.
The data set was analyzed on a dedicated work station and scored
using the Agatson method. Gantry rotation speed was 330 msecs and
collimation was .6 mm. 15mg of ivabradine and 0.8 mg of sl NTG was
given. The 3D data set was reconstructed in 5% intervals of the
60-95 % of the R-R cycle. Diastolic phases were analyzed on a
dedicated work station using MPR, MIP and VRT modes. The patient
received 80 cc of contrast.
FINDINGS: Aorta:  Normal size.  No calcifications.  No dissection.

Aortic Valve:  Trileaflet.  No calcifications.

Coronary Arteries:  Normal coronary origin.  Right dominance.

RCA is a large artery that gives rise to PDA and PLA. There is
calcified plaque in the mid and distal RCA causing mild stenosis
(25%).

Left main gives rise to LAD and LCX arteries. There is no LM
disease.

LAD has minimal calcified plaque in the prox segment causing minimal
stenosis (<25%).

LCX is a non-dominant artery that gives rise to two obtuse marginal
branches. There is minimal calcifications in the proximal LCx
causing minimal stenosis (<25%).

Other findings:

Normal pulmonary vein drainage into the left atrium.

Normal left atrial appendage without a thrombus.

Normal size of the pulmonary artery.
IMPRESSION: 1. Coronary calcium score of 315. This was 84th percentile for age
and sex matched control.

2. Normal coronary origin with right dominance.

3. Calcified plaque causing mild stenosis in the mid RCA and
proximal LAD.

4. Minimal stenosis in the proximal LCx (<25%).

5. CAD-RADS 2. Mild non-obstructive CAD (25-49%). Consider
non-atherosclerotic causes of chest pain. Consider preventive
therapy and risk factor modification.

EXAM:
OVER-READ INTERPRETATION  CT CHEST

The following report is an over-read performed by radiologist Dr.
TIMMERHUIS [REDACTED] on [DATE]. This over-read
does not include interpretation of cardiac or coronary anatomy or
pathology. The coronary CTA interpretation by the cardiologist is
attached.
FINDINGS: The visualized portions of the lower lung fields show no suspicious
nodules, masses, or infiltrates. 11 mm ground-glass nodule
identified right lower lobe on image [DATE]. No pleural fluid seen.

The visualized portions of the mediastinum and chest wall are
unremarkable.
IMPRESSION: 11 mm ground-glass nodule right lower lobe. Initial follow-up with
CT at 6-12 months is recommended to confirm persistence. If
persistent, repeat CT is recommended every 2 years until 5 years of
stability has been established. This recommendation follows the
consensus statement: Guidelines for Management of Incidental
Pulmonary Nodules Detected on CT Images: From the [HOSPITAL]

These results will be called to the ordering clinician or
representative by the Radiologist Assistant, and communication
documented in the PACS or [REDACTED].

*** End of Addendum ***
:
A retrospective scan was triggered in the descending thoracic aorta.
Axial non-contrast 3 mm slices were carried out through the heart.
The data set was analyzed on a dedicated work station and scored
using the Agatson method. Gantry rotation speed was 330 msecs and
collimation was .6 mm. 15mg of ivabradine and 0.8 mg of sl NTG was
given. The 3D data set was reconstructed in 5% intervals of the
60-95 % of the R-R cycle. Diastolic phases were analyzed on a
dedicated work station using MPR, MIP and VRT modes. The patient
received 80 cc of contrast.
FINDINGS: Aorta:  Normal size.  No calcifications.  No dissection.

Aortic Valve:  Trileaflet.  No calcifications.

Coronary Arteries:  Normal coronary origin.  Right dominance.

RCA is a large artery that gives rise to PDA and PLA. There is
calcified plaque in the mid and distal RCA causing mild stenosis
(25%).

Left main gives rise to LAD and LCX arteries. There is no LM
disease.

LAD has minimal calcified plaque in the prox segment causing minimal
stenosis (<25%).

LCX is a non-dominant artery that gives rise to two obtuse marginal
branches. There is minimal calcifications in the proximal LCx
causing minimal stenosis (<25%).

Other findings:

Normal pulmonary vein drainage into the left atrium.

Normal left atrial appendage without a thrombus.

Normal size of the pulmonary artery.
IMPRESSION: 1. Coronary calcium score of 315. This was 84th percentile for age
and sex matched control.

2. Normal coronary origin with right dominance.

3. Calcified plaque causing mild stenosis in the mid RCA and
proximal LAD.

4. Minimal stenosis in the proximal LCx (<25%).

5. CAD-RADS 2. Mild non-obstructive CAD (25-49%). Consider
non-atherosclerotic causes of chest pain. Consider preventive
therapy and risk factor modification.

## 2021-04-23 MED ORDER — IOHEXOL 350 MG/ML SOLN
80.0000 mL | Freq: Once | INTRAVENOUS | Status: AC | PRN
Start: 2021-04-23 — End: 2021-04-23
  Administered 2021-04-23: 80 mL via INTRAVENOUS

## 2021-04-23 MED ORDER — METOPROLOL TARTRATE 5 MG/5ML IV SOLN
10.0000 mg | Freq: Once | INTRAVENOUS | Status: AC
  Administered 2021-04-23: 10 mg via INTRAVENOUS

## 2021-04-23 MED ORDER — NITROGLYCERIN 0.4 MG SL SUBL
0.8000 mg | SUBLINGUAL_TABLET | Freq: Once | SUBLINGUAL | Status: AC
  Administered 2021-04-23: 0.8 mg via SUBLINGUAL

## 2021-04-23 NOTE — Progress Notes (Signed)
Pt reports feeling a little light headed, pt reports only eating a couple crackers at 0730 today, pt given peanut butter, graham crackers, and gingerale, and water. Pt able to eat the crackers with peanut butter and states that she feels much better, pt's bp rechecked and noted to be 101/73, heart rate 58

## 2021-04-23 NOTE — Progress Notes (Signed)
Patient tolerated procedure well. Ambulate w/o difficulty. Denies any lightheadedness or being dizzy. Pt denies any pain at this time. Sitting in chair, drinking water provided. P is encouraged to drink additional water throughout the day and reason explained to patient. Patient verbalized understanding and all questions answered. ABC intact. No further needs at this time. Discharge from procedure area w/o issues.  °

## 2021-04-24 ENCOUNTER — Telehealth: Payer: Self-pay | Admitting: Cardiology

## 2021-04-24 DIAGNOSIS — R911 Solitary pulmonary nodule: Secondary | ICD-10-CM

## 2021-04-24 NOTE — Telephone Encounter (Signed)
-----   Message from Kate Sable, MD sent at 04/24/2021  9:52 AM EST ----- Mild nonobstructive CAD.  Continue aspirin and Lipitor as prescribed.    Incidental pulmonary nodule noted.  Recommend referral to pulmonary medicine for additional input.  Thank you

## 2021-04-24 NOTE — Telephone Encounter (Signed)
[facility] Radiology calling with an overread Transferred to [facility]

## 2021-04-24 NOTE — Telephone Encounter (Signed)
Called patient and reviewed result note with her. Referral placed for Pulmonary medicine. Patient verbalized understanding and agreed with plan.

## 2021-04-24 NOTE — Telephone Encounter (Signed)
Received a call from Ernest at [facility] radiology informing us of the following over read on patients CCTA:  IMPRESSION: 11 mm ground-glass nodule right lower lobe. Initial follow-up with CT at 6-12 months is recommended to confirm persistence. If persistent, repeat CT is recommended every 2 years until 5 years of stability has been established. This recommendation follows the consensus statement: Guidelines for Management of Incidental Pulmonary Nodules Detected on CT Images: From the Fleischner Society 2017; Radiology 2017; 284:228-243.  Will route to Dr. Garen Lah for review.

## 2021-05-10 ENCOUNTER — Other Ambulatory Visit: Payer: Self-pay

## 2021-05-10 ENCOUNTER — Encounter: Payer: Self-pay | Admitting: Pulmonary Disease

## 2021-05-10 ENCOUNTER — Ambulatory Visit: Payer: Medicare HMO | Admitting: Pulmonary Disease

## 2021-05-10 VITALS — BP 120/78 | HR 88 | Temp 97.5°F | Ht 65.5 in | Wt 133.0 lb

## 2021-05-10 DIAGNOSIS — R911 Solitary pulmonary nodule: Secondary | ICD-10-CM

## 2021-05-10 DIAGNOSIS — F17210 Nicotine dependence, cigarettes, uncomplicated: Secondary | ICD-10-CM

## 2021-05-10 NOTE — Progress Notes (Signed)
Subjective:    Patient ID: Martha Wilson, female    DOB: 03/27/1951, 71 y.o.   MRN: [id number]  Patient Care Team: Sherlene Shams, MD as PCP - General (Internal Medicine) Debbe Odea, MD as PCP - Cardiology (Cardiology) Debbe Odea, MD as Consulting Physician (Cardiology)  Chief Complaint  Patient presents with   Consult    Abnormal CT   HPI Martha Wilson is a 71 year old current smoker with a 35-pack-year history of smoking and a history as noted below who presents for evaluation of an incidental lung abnormality found on cardiac CT.  She is kindly referred by Dr. Debbe Odea her primary care physician is Dr. Duncan Dull.  The patient had a coronary CT on 23 April 2021 for evaluation of CAD.  The lung windows on the CT images showed a groundglass opacity on the right lower lobe that was approximately 10 to 11 mm in size.  It does not have a solid component.  As such, this is indeterminate.  The patient has not had any symptomatology associated with this.  On a full review of systems her only complaint is that of occasional anxiety.  Otherwise review of systems is negative across-the-board.  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  She lives by herself.  No exotic pets.  No exotic hobbies.  Nose recent travel.  Review of Systems A 10 point review of systems was performed and it is as noted above otherwise negative.  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   Tricuspid valve prolapse    Wears dentures    partial upper and lower   Past Surgical History:   Procedure Laterality Date   APPLICATION OF CRANIAL NAVIGATION N/A 05/31/2020   Procedure: APPLICATION OF CRANIAL NAVIGATION;  Surgeon: Venetia Night, MD;  Location: [facility] ORS;  Service: Neurosurgery;  Laterality: N/A;   BREAST BIOPSY  1980's   pt states she had a bx in the 80's, not sure of side, benign   breat biopsy     CATARACT EXTRACTION W/PHACO Right 09/22/2018   Procedure: CATARACT EXTRACTION PHACO AND INTRAOCULAR LENS PLACEMENT (IOC)  RIGHT panooptix;  Surgeon: Galen Manila, MD;  Location: [facility];  Service: Ophthalmology;  Laterality: Right;   CATARACT EXTRACTION W/PHACO Left 10/06/2018   Procedure: CATARACT EXTRACTION PHACO AND INTRAOCULAR LENS PLACEMENT (IOC) LEFT PANOPTIX LENS;  Surgeon: Galen Manila, MD;  Location: [facility];  Service: Ophthalmology;  Laterality: Left;   COLONOSCOPY     COLONOSCOPY WITH PROPOFOL N/A 11/19/2018   Procedure: COLONOSCOPY WITH PROPOFOL;  Surgeon: Wyline Mood, MD;  Location: [facility];  Service: Gastroenterology;  Laterality: N/A;   ORIF FOOT FRACTURE     VENTRICULOPERITONEAL SHUNT Right 05/31/2020   Procedure: SHUNT INSERTION VENTRICULAR-PERITONEAL;  Surgeon: Venetia Night, MD;  Location: [facility] ORS;  Service: Neurosurgery;  Laterality: Right;  Dr Tonna Boehringer to assist   Family History  Problem Relation Age of Onset   Heart attack Father    Thyroid disease Brother    Early death Brother  Social History   Tobacco Use   Smoking status: Former    Packs/day: 1.00    Years: 35.00    Pack years: 35.00    Types: Cigarettes   Smokeless tobacco: Never   Tobacco comments:    "occasional" cigarette  Substance Use Topics   Alcohol use: Yes    Alcohol/week: 3.0 standard drinks    Types: 3 Glasses of wine per week    Comment: OCC   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   aspirin EC 81 MG tablet Take 1 tablet (81 mg total) by mouth daily. Swallow whole.   atorvastatin (LIPITOR) 20 MG tablet Take 1 tablet (20 mg total) by mouth daily.   Cyanocobalamin (B-12) 2500 MCG TABS Take 2,500 mcg by mouth daily.   methimazole (TAPAZOLE) 5 MG tablet Take 0.5 tablets (2.5 mg total) by mouth at bedtime.   Multiple Vitamins-Minerals (PRESERVISION AREDS 2) CAPS Take 1 capsule by mouth 2 (two) times daily.   Polyvinyl Alcohol-Povidone (REFRESH OP) Place 1 drop into both eyes 2 (two) times daily as needed (dry eye).   [DISCONTINUED] Lifitegrast (XIIDRA) 5 % SOLN Place 1 drop into both eyes 2 (two) times daily.   Immunization History  Administered Date(s) Administered   Fluad Quad(high Dose 65+) 02/15/2019, 12/21/2019, 12/25/2020   Moderna Sars-Covid-2 Vaccination 04/22/2019, 05/13/2019, 02/01/2020   PNEUMOCOCCAL CONJUGATE-20 12/25/2020      Objective:   Physical Exam BP 120/78 (BP Location: Left Arm, Patient Position: Sitting, Cuff Size: Normal)   Pulse 88   Temp (!) 97.5 F (36.4 C) (Oral)   Ht 5' 5.5" (1.664 m)   Wt 133 lb (60.3 kg)   SpO2 99%   BMI 21.80 kg/m  GENERAL: Well-developed, well-nourished woman, looks somewhat younger than stated age.  Ambulatory with assistance of a cane, no respiratory distress, no conversational dyspnea. HEAD: Normocephalic, atraumatic.  EYES: Pupils equal, round, reactive to light.  No scleral icterus.  MOUTH: Nose/mouth/throat not examined due to masking requirements for COVID 19. NECK: Supple. No thyromegaly. Trachea midline. No JVD.  No adenopathy. PULMONARY: Good air entry bilaterally.  No adventitious sounds. CARDIOVASCULAR: S1 and S2. Regular rate and rhythm.  No rubs, murmurs or gallops heard. ABDOMEN: Benign. MUSCULOSKELETAL: No joint  deformity, no clubbing, no edema.  NEUROLOGIC: Awake alert fully oriented, walks with assistance of a cane but otherwise no other focality noted. SKIN: Intact,warm,dry. PSYCH: Mood and behavior normal.  Representative image of lung windows from cardiac CT performed 23 April 2021, 11 mm groundglass opacity noted on image 16 series 9:      Assessment & Plan:     ICD-10-CM   1. Lung nodule  R91.1 CT CHEST WO CONTRAST   Groundglass opacity with no solid components Will get dedicated chest CT to establish baseline CT April 2023    2. Tobacco dependence due to cigarettes  F17.210    Patient counseled regards to discontinuation of smoking Total counseling time 3 to 5 minutes     Orders Placed This Encounter  Procedures   CT CHEST WO CONTRAST    Standing Status:   Future    Number of  Occurrences:   1    Standing Expiration Date:   05/10/2022    Scheduling Instructions:     Beginning of April    Order Specific Question:   Preferred imaging location?    Answer:   [facility]   See the patient in follow-up in April after her CT scan is performed.  She is to contact us prior to that time should any new questions or concerns arise.  Gailen Shelter, MD Advanced Bronchoscopy PCCM [facility]    *This note was dictated using voice recognition software/Dragon.  Despite best efforts to proofread, errors can occur which can change the meaning. Any transcriptional errors that result from this process are unintentional and may not be fully corrected at the time of dictation.

## 2021-05-10 NOTE — Patient Instructions (Signed)
We are going to repeat a CT scan for the first part of April.  We will see her in follow-up after that scan is performed.  Please call sooner should you have any questions, concerns or issues with your breathing ensue.  Keep working on quitting smoking.  Below are some common object comparisons with regards to the small "cloud" seen in your lung.  Telford Nab this area is about the size of a small pea and perhaps even smaller:

## 2021-05-11 ENCOUNTER — Other Ambulatory Visit: Payer: Self-pay | Admitting: *Deleted

## 2021-05-11 MED ORDER — ATORVASTATIN CALCIUM 20 MG PO TABS: 20.0000 mg | ORAL_TABLET | Freq: Every day | ORAL | 1 refills | Status: DC

## 2021-05-17 ENCOUNTER — Encounter: Payer: Self-pay | Admitting: Cardiology

## 2021-05-17 ENCOUNTER — Other Ambulatory Visit: Payer: Self-pay

## 2021-05-17 ENCOUNTER — Ambulatory Visit: Payer: Medicare HMO | Admitting: Cardiology

## 2021-05-17 VITALS — BP 110/70 | HR 87 | Ht 65.5 in | Wt 132.0 lb

## 2021-05-17 DIAGNOSIS — I361 Nonrheumatic tricuspid (valve) insufficiency: Secondary | ICD-10-CM

## 2021-05-17 DIAGNOSIS — R9430 Abnormal result of cardiovascular function study, unspecified: Secondary | ICD-10-CM | POA: Diagnosis not present

## 2021-05-17 DIAGNOSIS — I2510 Atherosclerotic heart disease of native coronary artery without angina pectoris: Secondary | ICD-10-CM

## 2021-05-17 NOTE — Patient Instructions (Signed)
Medication Instructions:  Your physician recommends that you continue on your current medications as directed. Please refer to the Current Medication list given to you today.  *If you need a refill on your cardiac medications before your next appointment, please call your pharmacy*   Lab Work: None ordered If you have labs (blood work) drawn today and your tests are completely normal, you will receive your results only by: [place] (if you have MyChart) OR A paper copy in the mail If you have any lab test that is abnormal or we need to change your treatment, we will call you to review the results.   Testing/Procedures: None ordered   Follow-Up: At [facility], you and your health needs are our priority.  As part of our continuing mission to provide you with exceptional heart care, we have created designated Provider Care Teams.  These Care Teams include your primary Cardiologist (physician) and Advanced Practice Providers (APPs -  Physician Assistants and Nurse Practitioners) who all work together to provide you with the care you need, when you need it.  We recommend signing up for the patient portal called "MyChart".  Sign up information is provided on this After Visit Summary.  MyChart is used to connect with patients for Virtual Visits (Telemedicine).  Patients are able to view lab/test results, encounter notes, upcoming appointments, etc.  Non-urgent messages can be sent to your provider as well.   To learn more about what you can do with MyChart, go to [email or web address].    Your next appointment:   6 month(s)  The format for your next appointment:   In Person  Provider:   You may see Kate Sable, MD or one of the following Advanced Practice Providers on your designated Care Team:   Murray Hodgkins, NP Christell Faith, PA-C Cadence Kathlen Mody, [place]    Other Instructions

## 2021-05-17 NOTE — Progress Notes (Signed)
Cardiology Office Note:    Date:  05/17/2021   ID:  Martha Wilson, DOB 29-Apr-1950, MRN [id number]  PCP:  [name], MD  [facility] Cardiologist:  None  [facility] Electrophysiologist:  None   Referring MD: [name], MD   Chief Complaint  Patient presents with   OTher    Follow up post CT -- Meds reviewed verbally with patient.     History of Present Illness:    Martha Wilson is a 71 y.o. female with a hx of nonobstructive CAD (mild LAD, RCA), tricuspid regurgitation, NPH s/p VP shunt 05/2020 at [facility] who presents for follow-up.    Being seen for mildly reduced EF, nonobstructive CAD.  Mild to moderate TR.  Coronary CTA was ordered to evaluate CAD progression with recent echo showing EF 45%.   She  denies chest pain, shortness of breath, edema.  Takes aspirin 81 mg, Lipitor as prescribed.  Planning on exercising more to build up her conditioning.  Has dizziness occasionally due to NPH.  Blood pressure tends to run low at times.   Prior notes Echo 01/2021, EF 45%, mild to moderate TR left heart cath August 2014 20% proximal LAD lesion, mild diffuse disease in RCA.   Apparently has a history of tricuspid valve prolapse with mild RV dilatation.   Outside echo report 01/2020 showed normal systolic function, EF 80%.  Moderately enlarged RV, RV normal systolic function, RVSP 32 mmHg. Previous cardiologist was in [place].  She still gets yearly echocardiogram for tricuspid valve insufficiency with serial echocardiograms.  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   Tricuspid valve prolapse     Wears dentures    partial upper and lower    Past Surgical History:  Procedure Laterality Date   APPLICATION OF CRANIAL NAVIGATION N/A 05/31/2020   Procedure: APPLICATION OF CRANIAL NAVIGATION;  Surgeon: Meade Maw, MD;  Location: [facility];  Service: Neurosurgery;  Laterality: N/A;   BREAST BIOPSY  1980's   pt states she had a bx in the 80's, not sure of side, benign   breat biopsy     CATARACT EXTRACTION W/PHACO Right 09/22/2018   Procedure: CATARACT EXTRACTION PHACO AND INTRAOCULAR LENS PLACEMENT ([place])  RIGHT panooptix;  Surgeon: Birder Robson, MD;  Location: [place];  Service: Ophthalmology;  Laterality: Right;   CATARACT EXTRACTION W/PHACO Left 10/06/2018   Procedure: CATARACT EXTRACTION PHACO AND INTRAOCULAR LENS PLACEMENT ([place]) LEFT PANOPTIX LENS;  Surgeon: Birder Robson, MD;  Location: [place];  Service: Ophthalmology;  Laterality: Left;   COLONOSCOPY     COLONOSCOPY WITH PROPOFOL N/A 11/19/2018   Procedure: COLONOSCOPY WITH PROPOFOL;  Surgeon: Jonathon Bellows, MD;  Location: [facility] END[facility]COPY;  Service: Gastroenterology;  Laterality: N/A;   ORIF FOOT FRACTURE     VENTRICULOPERITONEAL SHUNT Right 05/31/2020   Procedure: SHUNT INSERTION VENTRICULAR-PERITONEAL;  Surgeon: Meade Maw, MD;  Location: [facility];  Service: Neurosurgery;  Laterality: Right;  Dr Lysle Pearl to assist    Current Medications: Current Meds  Medication Sig   acetaminophen (TYLENOL) 500  MG tablet Take 1,000 mg by mouth every 6 (six) hours as needed for moderate pain or headache.   aspirin EC 81 MG tablet Take 1 tablet (81 mg total) by mouth daily. Swallow whole.   atorvastatin (LIPITOR) 20 MG tablet Take 1 tablet (20 mg total) by mouth daily.   Cyanocobalamin (B-12) 2500 MCG TABS Take 2,500 mcg by mouth daily.   methimazole (TAPAZOLE) 5 MG tablet Take 0.5 tablets (2.5 mg total) by mouth at bedtime.   Multiple Vitamins-Minerals (PRESERVISION AREDS 2) CAPS Take 1 capsule by mouth  2 (two) times daily.   Polyvinyl Alcohol-Povidone (REFRESH OP) Place 1 drop into both eyes 2 (two) times daily as needed (dry eye).     Allergies:   Bee venom, Latex, Penicillins, and Adhesive [tape]   Social History   Socioeconomic History   Marital status: Single    Spouse name: Not on file   Number of children: Not on file   Years of education: Not on file   Highest education level: Not on file  Occupational History   Not on file  Tobacco Use   Smoking status: Former    Packs/day: 1.00    Years: 35.00    Pack years: 35.00    Types: Cigarettes   Smokeless tobacco: Never   Tobacco comments:    "occasional" cigarette  Vaping Use   Vaping Use: Never used  Substance and Sexual Activity   Alcohol use: Yes    Alcohol/week: 3.0 standard drinks    Types: 3 Glasses of wine per week    Comment: OCC   Drug use: Never   Sexual activity: Not Currently  Other Topics Concern   Not on file  Social History Narrative   Not on file   Social Determinants of Health   Financial Resource Strain: Not on file  Food Insecurity: Not on file  Transportation Needs: Not on file  Physical Activity: Not on file  Stress: Not on file  Social Connections: Not on file     Family History: The patient's family history includes Early death in her brother; Heart attack in her father; Thyroid disease in her brother.  ROS:   Please see the history of present illness.     All other systems reviewed and are negative.  EKGs/Labs/Other Studies Reviewed:    The following studies were reviewed today: Echocardiogram 01/10/2020   1. Normal left ventricular chamber size. Normal wall motion and systolic  function.  The biplane LVEF is 63%.    2. Moderately enlarged right ventricle with normal systolic function.    3. The tricuspid valve is not well-visualized.  There is an eccentric jet of  tricuspid regurgitation directed towards the septum.  Visually, it only  appears mild in severity but may be  underestimated due to incomplete  visualization.    4. The estimated right ventricular systolic pressure is 32 mmHg.    5. Inferior vena cava is not well visualized but appears normal in  dimension.  The hepatic veins were not visualized.    6. When directly compared to the prior study from 01/11/2019, the TR appears  similar in severity.    EKG:  EKG not ordered today.  Recent Labs: 12/25/2020: ALT 30; TSH 1.54 04/16/2021: BUN 18; Creatinine, Ser 0.92; Potassium 4.7; Sodium 140  Recent Lipid Panel    Component Value Date/Time   CHOL 159 11/15/2020 0941   TRIG 80 11/15/2020 0941   HDL 72 11/15/2020 0941   CHOLHDL 2.2 11/15/2020 0941  VLDL 16 11/15/2020 0941   LDLCALC 71 11/15/2020 0941     Risk Assessment/Calculations:      Physical Exam:    VS:  BP 110/70 (BP Location: Left Arm, Patient Position: Sitting, Cuff Size: Normal)    Pulse 87    Ht 5' 5.5" (1.664 m)    Wt 132 lb (59.9 kg)    SpO2 97%    BMI 21.63 kg/m     Wt Readings from Last 3 Encounters:  05/17/21 132 lb (59.9 kg)  05/10/21 133 lb (60.3 kg)  04/16/21 132 lb (59.9 kg)     GEN:  Well nourished, well developed in no acute distress HEENT: Normal NECK: No JVD; No carotid bruits LYMPHATICS: No lymphadenopathy CARDIAC: RRR, no murmurs, rubs, gallops RESPIRATORY:  Clear to auscultation without rales, wheezing or rhonchi  ABDOMEN: Soft, non-tender, non-distended MUSCULOSKELETAL:  No edema; No deformity  SKIN: Warm and dry NEUROLOGIC:  Alert and oriented x 3 PSYCHIATRIC:  Normal affect   ASSESSMENT:    1. Coronary artery disease involving native coronary artery of native heart without angina pectoris   2. Nonrheumatic tricuspid valve regurgitation   3. Ejection fraction < 50%     PLAN:    In order of problems listed above:  Nonobstructive CAD, mild disease in proximal LAD and mid RCA.  Calcium score 315.  Continue aspirin 81 mg, Lipitor 20 mg daily.  Cholesterol adequately controlled. Mild to moderate  tricuspid regurgitation, continue monitoring TR with frequent echoes.  Mildly reduced EF.  Mildly reduced ejection fraction, EF 45%.  Appears euvolemic, low normal BPs, NPH with VP shunt, dizziness preventing use of GDMT at this point.  Follow-up in 6 months   Medication Adjustments/Labs and Tests Ordered: Current medicines are reviewed at length with the patient today.  Concerns regarding medicines are outlined above.  No orders of the defined types were placed in this encounter.   No orders of the defined types were placed in this encounter.          Signed, Kate Sable, MD  05/17/2021 11:05 AM    [place]

## 2021-06-07 ENCOUNTER — Other Ambulatory Visit: Payer: Self-pay

## 2021-06-07 ENCOUNTER — Ambulatory Visit: Payer: Medicare HMO | Admitting: Podiatry

## 2021-06-07 ENCOUNTER — Encounter: Payer: Self-pay | Admitting: Podiatry

## 2021-06-07 DIAGNOSIS — B351 Tinea unguium: Secondary | ICD-10-CM | POA: Diagnosis not present

## 2021-06-07 DIAGNOSIS — M79674 Pain in right toe(s): Secondary | ICD-10-CM | POA: Diagnosis not present

## 2021-06-07 DIAGNOSIS — M79675 Pain in left toe(s): Secondary | ICD-10-CM

## 2021-06-07 NOTE — Progress Notes (Signed)
?Subjective:  ?Patient ID: Martha Wilson, female    DOB: 05/17/1950,  MRN: [id number] ? ?Chief Complaint  ?Patient presents with  ? Nail Problem  ?  Nail trim   ? ? ?71 y.o. female presents with the above complaint.  Patient presents with thickened elongated dystrophic toenails x10.  Pain to palpation.  They are not able to do with her themselves.  They would like for [place] to debride them down.  She does not have any secondary complaints at this time. ? ?Review of Systems: Negative except as noted in the HPI. Denies N/V/F/Ch. ? ?Past Medical History:  ?Diagnosis Date  ? Allergies   ? Arthritis   ? right knee  ? Headache   ? History of methicillin resistant staphylococcus aureus (MRSA)   ? Hyperthyroidism   ? MVP (mitral valve prolapse)   ? Normal pressure hydrocephalus (HCC)   ? Osteopenia   ? Palpitations   ? "stress related"  ? Skin cancer   ? BASAL CELL  ? 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 by Elayne Guerin for NPH  ? Tricuspid valve prolapse   ? Wears dentures   ? partial upper and lower  ? ? ?Current Outpatient Medications:  ?  acetaminophen (TYLENOL) 500 MG tablet, Take 1,000 mg by mouth every 6 (six) hours as needed for moderate pain or headache., Disp: , Rfl:  ?  aspirin EC 81 MG tablet, Take 1 tablet (81 mg total) by mouth daily. Swallow whole., Disp: 30 tablet, Rfl: 5 ?  atorvastatin (LIPITOR) 20 MG tablet, Take 1 tablet (20 mg total) by mouth daily., Disp: 90 tablet, Rfl: 1 ?  Cyanocobalamin (B-12) 2500 MCG TABS, Take 2,500 mcg by mouth daily., Disp: , Rfl:  ?  methimazole (TAPAZOLE) 5 MG tablet, Take 0.5 tablets (2.5 mg total) by mouth at bedtime., Disp: 90 tablet, Rfl: 1 ?  Multiple Vitamins-Minerals (PRESERVISION AREDS 2) CAPS, Take 1 capsule by mouth 2 (two) times daily., Disp: , Rfl:  ?  Polyvinyl Alcohol-Povidone (REFRESH OP), Place 1 drop into both eyes 2 (two) times daily as needed (dry eye)., Disp: , Rfl:  ? ?Social History  ? ?Tobacco Use  ?Smoking Status Former  ?  Packs/day: 1.00  ? Years: 35.00  ? Pack years: 35.00  ? Types: Cigarettes  ?Smokeless Tobacco Never  ?Tobacco Comments  ? "occasional" cigarette  ? ? ?Allergies  ?Allergen Reactions  ? Bee Venom Anaphylaxis  ? Latex Itching  ? Penicillins   ?  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 Adhesive [Tape] Rash  ?  Band-aids  ? ?Objective:  ?There were no vitals filed for this visit. ?There is no height or weight on file to calculate BMI. ?Constitutional Well developed. ?Well nourished.  ?Vascular Dorsalis pedis pulses palpable bilaterally. ?Posterior tibial pulses palpable bilaterally. ?Capillary refill normal to all digits.  ?No cyanosis or clubbing noted. ?Pedal hair growth normal.  ?Neurologic Normal speech. ?Oriented to person, place, and time. ?Epicritic sensation to light touch grossly present bilaterally.  ?Dermatologic  dystrophic mycotic nail noted to the left hallux that seems to be slightly more severe than the ones listed below.  Mild pain on palpation. ?Nail examination shows thickened elongated dystrophic mycotic nails x10.  Pain on palpation ?Skin within normal limits  ?Orthopedic: Normal joint ROM without pain or crepitus bilaterally. ?No visible deformities. ?No bony tenderness.  ? ?Radiographs: None ?Assessment:  ? ?1. Pain due to onychomycosis of toenails of both feet   ? ? ? ? ? ?  Plan:  ?Patient was evaluated and treated and all questions answered. ? ? ?Onychomycosis with pain  ?-Nails palliatively debrided as below. ?-Educated on self-care ? ?Procedure: Nail Debridement ?Rationale: pain  ?Type of Debridement: manual, sharp debridement. ?Instrumentation: Nail nipper, rotary burr. ?Number of Nails: 10 ? ?Procedures and Treatment: Consent by patient was obtained for treatment procedures. The patient understood the discussion of treatment and procedures well. All questions were answered thoroughly reviewed. Debridement of mycotic and hypertrophic toenails, 1 through  5 bilateral and clearing of subungual debris. No ulceration, no infection noted.  ?Return Visit-Office Procedure: Patient instructed to return to the office for a follow up visit 3 months for continued evaluation and treatment. ? ?Boneta Lucks, DPM ?  ? ?No follow-ups on file. ? ? ?No follow-ups on file. ?

## 2021-06-12 ENCOUNTER — Institutional Professional Consult (permissible substitution): Payer: Medicare HMO | Admitting: Pulmonary Disease

## 2021-06-25 ENCOUNTER — Encounter: Payer: Self-pay | Admitting: Internal Medicine

## 2021-06-25 ENCOUNTER — Telehealth (INDEPENDENT_AMBULATORY_CARE_PROVIDER_SITE_OTHER): Payer: Medicare HMO | Admitting: Internal Medicine

## 2021-06-25 VITALS — BP 129/86 | HR 86 | Ht 65.5 in | Wt 131.0 lb

## 2021-06-25 DIAGNOSIS — Z780 Asymptomatic menopausal state: Secondary | ICD-10-CM

## 2021-06-25 DIAGNOSIS — I2583 Coronary atherosclerosis due to lipid rich plaque: Secondary | ICD-10-CM

## 2021-06-25 DIAGNOSIS — A084 Viral intestinal infection, unspecified: Secondary | ICD-10-CM | POA: Insufficient documentation

## 2021-06-25 DIAGNOSIS — R7303 Prediabetes: Secondary | ICD-10-CM | POA: Diagnosis not present

## 2021-06-25 DIAGNOSIS — D582 Other hemoglobinopathies: Secondary | ICD-10-CM

## 2021-06-25 DIAGNOSIS — G912 (Idiopathic) normal pressure hydrocephalus: Secondary | ICD-10-CM

## 2021-06-25 DIAGNOSIS — E0590 Thyrotoxicosis, unspecified without thyrotoxic crisis or storm: Secondary | ICD-10-CM

## 2021-06-25 DIAGNOSIS — R911 Solitary pulmonary nodule: Secondary | ICD-10-CM

## 2021-06-25 DIAGNOSIS — Z1211 Encounter for screening for malignant neoplasm of colon: Secondary | ICD-10-CM

## 2021-06-25 DIAGNOSIS — N3946 Mixed incontinence: Secondary | ICD-10-CM

## 2021-06-25 DIAGNOSIS — R350 Frequency of micturition: Secondary | ICD-10-CM

## 2021-06-25 DIAGNOSIS — I2510 Atherosclerotic heart disease of native coronary artery without angina pectoris: Secondary | ICD-10-CM

## 2021-06-25 DIAGNOSIS — I429 Cardiomyopathy, unspecified: Secondary | ICD-10-CM

## 2021-06-25 DIAGNOSIS — R2689 Other abnormalities of gait and mobility: Secondary | ICD-10-CM

## 2021-06-25 NOTE — Assessment & Plan Note (Signed)
Presumed Norovirus ,, but advised to rule out COVID before determining when she can break quarantine and see her  73 yr old mother  ?

## 2021-06-25 NOTE — Assessment & Plan Note (Addendum)
Secondary to NPH. She is s/p v/p shunt by Meade Maw.  ?

## 2021-06-25 NOTE — Assessment & Plan Note (Addendum)
Improved with treatment of NPH, but becoming more frequent again.  ?

## 2021-06-25 NOTE — Progress Notes (Signed)
Virtual Visit via Caregility Note ? ?This visit type was conducted due to national recommendations for restrictions regarding the COVID-19 pandemic (e.g. social distancing).  This format is felt to be most appropriate for this patient at this time.  All issues noted in this document were discussed and addressed.  No physical exam was performed (except for noted visual exam findings with Video Visits).  ? ?I connected withNAME@ on 06/25/21 at  2:00 PM EDT by a video enabled telemedicine application  and verified that I am speaking with the correct person using two identifiers. ?Location patient: home ?Location provider: work or home office ?Persons participating in the virtual visit: patient, provider ? ?I discussed the limitations, risks, security and privacy concerns of performing an evaluation and management service by telephone and the availability of in person appointments. I also discussed with the patient that there may be a patient responsible charge related to this service. The patient expressed understanding and agreed to proceed. ? ? ?Reason for visit:   gastroenteritis ? ? ?HPI: ? ?71 yr old female recently diagnosed with NPH s/p shunt  correction by Izora Ribas. 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 ?Developed nausea and vomiting Friday night after midnight.  Several epsiodes on Saturday.  No po intake except liquids.  On Sunday the diarrhea started.  Has not checked for COVID.  ? ? ?NPH;  last adjustment of shunt to 80 resulted In severe headaches that took weeks to resolve,  she is hesistant to have another adjustment but urinary frequency is bothersome..  voiding every hour  .  Also passing small caliber stool during her urinary voids,  with no  sensation.  Has a normal BM every morning without incontinence.    ? ?CAD:  reviewed coronary calcium CT/score ? ?RLL pulmonary nodule:  already referred to [facility] and swaw Dr Patsey Berthold in  February ? ? ?ROS: See pertinent positives and negatives per HPI. ? ?Past Medical History:  ?Diagnosis Date  ? Allergies   ? Arthritis   ? right knee  ? Headache   ? History of methicillin resistant staphylococcus aureus (MRSA)   ? Hyperthyroidism   ? MVP (mitral valve prolapse)   ? Normal pressure hydrocephalus (HCC)   ? Osteopenia   ? Palpitations   ? "stress related"  ? Skin cancer   ? BASAL CELL  ? 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 by Elayne Guerin for NPH  ? Tricuspid valve prolapse   ? Wears dentures   ? partial upper and lower  ? ? ?Past Surgical History:  ?Procedure Laterality Date  ? APPLICATION OF CRANIAL NAVIGATION N/A 05/31/2020  ? Procedure: APPLICATION OF CRANIAL NAVIGATION;  Surgeon: Meade Maw, MD;  Location: [facility];  Service: Neurosurgery;  Laterality: N/A;  ? BREAST BIOPSY  1980's  ? pt states she had a bx in the 80's, not sure of side, benign  ? breat biopsy    ? CATARACT EXTRACTION W/PHACO Right 09/22/2018  ? Procedure: CATARACT EXTRACTION PHACO AND INTRAOCULAR LENS PLACEMENT ([place])  RIGHT panooptix;  Surgeon: Birder Robson, MD;  Location: [place];  Service: Ophthalmology;  Laterality: Right;  ? CATARACT EXTRACTION W/PHACO Left 10/06/2018  ? Procedure: CATARACT EXTRACTION PHACO AND INTRAOCULAR LENS PLACEMENT ([place]) LEFT PANOPTIX LENS;  Surgeon: Birder Robson, MD;  Location: [place];  Service: Ophthalmology;  Laterality: Left;  ? COLONOSCOPY    ? COLONOSCOPY WITH PROPOFOL N/A 11/19/2018  ?  Procedure: COLONOSCOPY WITH PROPOFOL;  Surgeon: Jonathon Bellows, MD;  [facility];  Service: Gastroenterology;  Laterality: N/A;  ? ORIF FOOT FRACTURE    ? VENTRICULOPERITONEAL SHUNT Right 05/31/2020  ? Procedure: SHUNT INSERTION VENTRICULAR-PERITONEAL;  Surgeon: Meade Maw, MD;  Location: [facility];  Service: Neurosurgery;  Laterality: Right;  Dr Lysle Pearl to assist  ? ? ?Family History  ?Problem Relation Age of Onset  ? Heart attack Father   ?  Thyroid disease Brother   ? Early death Brother   ? ? ?SOCIAL HX:  reports that she has quit smoking. Her smoking use included cigarettes. She has a 35.00 pack-year smoking history. She has never used smokeless tobacco. She reports current alcohol use of about 3.0 standard drinks per week. She reports that she does not use drugs.  ? ? ?Current Outpatient Medications:  ?  acetaminophen (TYLENOL) 500 MG tablet, Take 1,000 mg by mouth every 6 (six) hours as needed for moderate pain or headache., Disp: , Rfl:  ?  aspirin EC 81 MG tablet, Take 1 tablet (81 mg total) by mouth daily. Swallow whole., Disp: 30 tablet, Rfl: 5 ?  atorvastatin (LIPITOR) 20 MG tablet, Take 1 tablet (20 mg total) by mouth daily., Disp: 90 tablet, Rfl: 1 ?  Cyanocobalamin (B-12) 2500 MCG TABS, Take 2,500 mcg by mouth daily., Disp: , Rfl:  ?  methimazole (TAPAZOLE) 5 MG tablet, Take 0.5 tablets (2.5 mg total) by mouth at bedtime., Disp: 90 tablet, Rfl: 1 ?  Multiple Vitamins-Minerals (PRESERVISION AREDS 2) CAPS, Take 1 capsule by mouth 2 (two) times daily., Disp: , Rfl:  ?  Polyvinyl Alcohol-Povidone (REFRESH OP), Place 1 drop into both eyes 2 (two) times daily as needed (dry eye)., Disp: , Rfl:  ? ?EXAM: ? ?VITALS per patient if applicable: ? ?GENERAL: alert, oriented, appears well and in no acute distress ? ?HEENT: atraumatic, conjunttiva clear, no obvious abnormalities on inspection of external nose and ears ? ?NECK: normal movements of the head and neck ? ?LUNGS: on inspection no signs of respiratory distress, breathing rate appears normal, no obvious gross SOB, gasping or wheezing ? ?CV: no obvious cyanosis ? ?MS: moves all visible extremities without noticeable abnormality ? ?PSYCH/NEURO: pleasant and cooperative, no obvious depression or anxiety, speech and thought processing grossly intact ? ?ASSESSMENT AND PLAN: ? ?Discussed the following assessment and plan: ? ?Postmenopausal estrogen deficiency - Plan: DG Bone Density ? ?Colon cancer  screening - Plan: Ambulatory referral to Gastroenterology ? ?Hyperthyroidism - Plan: CANCELED: TSH ? ?Prediabetes - Plan: Comprehensive metabolic panel, Hemoglobin A1c ? ?Elevated hemoglobin (HCC) - Plan: CBC with Differential/Platelet ? ?Urinary frequency - Plan: Urinalysis, Routine w reflex microscopic, Urine Culture ? ?Balance disorder ? ?NPH (normal pressure hydrocephalus) ([place]) ? ?Viral gastroenteritis ? ?Coronary artery disease due to lipid rich plaque ? ?Urge and stress incontinence ? ?Cardiomyopathy, unspecified type ([place]) ? ?Pulmonary nodule, right ? ?Hyperthyroidism ?Managed by Endocrinology.  Last TSH was 1.19 in December  ? ?Balance disorder ?Secondary to NPH. She is s/p v/p shunt by Meade Maw.  ? ?NPH (normal pressure hydrocephalus) ([place]) ?Shunt setting was reduced to 80 in late September, which reduced  her urinary frequency but increased her frequency of headaches.  She has had an increase in frequency and is now voiding every hour.  She has noted some fecal elimination during urinary voids which is asymptomatic ,  But a normal BM every morning which is perceived and controlled .  Recommend she discuss with Dr Izora Ribas . ? ?  Viral gastroenteritis ?Presumed Norovirus ,, but advised to rule out COVID before determining when she can break quarantine and see her  42 yr old mother  ? ?Coronary artery disease due to lipid rich plaque ?Coronary calcium score was 315.  She has <25% stenosis of the LAD and 25% stenosis of the RCA.  Continue atorvastatin ? ?Urge and stress incontinence ?Improved with treatment of NPH, but becoming more frequent again.  ? ?Cardiomyopathy ([place]) ?EF 40 to 45% by Oct 2022 ECHO with global hypokinesis and Grade I diastolic dysfunction  . She has nonobstructive CAD  ? ?Pulmonary nodule, right ?She has been seen by DR Patsey Berthold at the [facility]. Follow up /surveillance CT is planned  ? ?  ?I discussed the assessment and treatment plan with the patient. The patient  was provided an opportunity to ask questions and all were answered. The patient agreed with the plan and demonstrated an understanding of the instructions. ?  ?The patient was advised to call back or seek an in-pe

## 2021-06-25 NOTE — Assessment & Plan Note (Signed)
Coronary calcium score was 315.  She has <25% stenosis of the LAD and 25% stenosis of the RCA.  Continue atorvastatin ?

## 2021-06-25 NOTE — Assessment & Plan Note (Signed)
She has been seen by DR Patsey Berthold at the [facility]. Follow up /surveillance CT is planned  ?

## 2021-06-25 NOTE — Assessment & Plan Note (Addendum)
EF 40 to 45% by Oct 2022 ECHO with global hypokinesis and Grade I diastolic dysfunction  . She has nonobstructive CAD  ?

## 2021-06-25 NOTE — Assessment & Plan Note (Signed)
Managed by Endocrinology.  Last TSH was 1.19 in December  ?

## 2021-06-25 NOTE — Assessment & Plan Note (Signed)
Shunt setting was reduced to 80 in late September, which reduced  her urinary frequency but increased her frequency of headaches.  She has had an increase in frequency and is now voiding every hour.  She has noted some fecal elimination during urinary voids which is asymptomatic ,  But a normal BM every morning which is perceived and controlled .  Recommend she discuss with Dr Izora Ribas . ?

## 2021-06-26 ENCOUNTER — Telehealth: Payer: Self-pay

## 2021-06-26 NOTE — Telephone Encounter (Signed)
CALLED PATIENT NO ANSWER LEFT VOICEMAIL FOR A CALL BACK ? ?

## 2021-06-27 ENCOUNTER — Telehealth: Payer: Self-pay

## 2021-06-27 NOTE — Telephone Encounter (Signed)
CALLED PATIENT NO ANSWER LEFT VOICEMAIL FOR A CALL BACK °Letter sent °

## 2021-07-10 ENCOUNTER — Other Ambulatory Visit: Payer: Self-pay

## 2021-07-10 ENCOUNTER — Ambulatory Visit
Admission: RE | Admit: 2021-07-10 | Discharge: 2021-07-10 | Disposition: A | Discharge status: Home / Self Care | Payer: Medicare HMO | Source: Ambulatory Visit | Attending: Pulmonary Disease | Admitting: Pulmonary Disease

## 2021-07-10 DIAGNOSIS — R911 Solitary pulmonary nodule: Secondary | ICD-10-CM | POA: Insufficient documentation

## 2021-07-10 IMAGING — CT CT CHEST W/O CM
2 of 4 series · 15 of 36 positions shown, 18 images · non-contrast
Comparison: CT [DATE]

CLINICAL DATA: Follow-up pulmonary nodule seen on prior CT.



[Series 2: chest 2.00 · axial · 0.66mm/px · z∈[-1285,-1007]mm · 12 of 165 slices shown, 15 images]
[im 13/165  mediastinal]
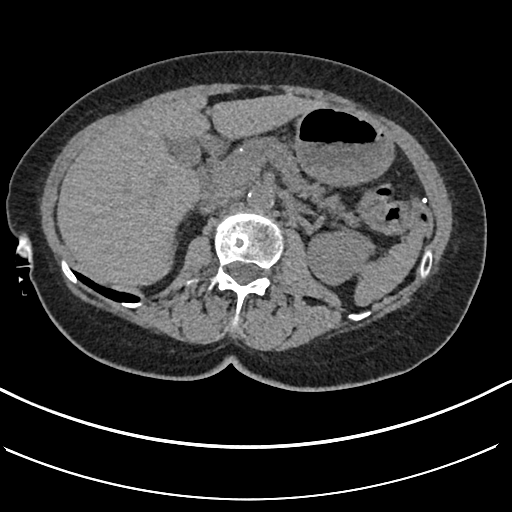
[im 13/165  lung]
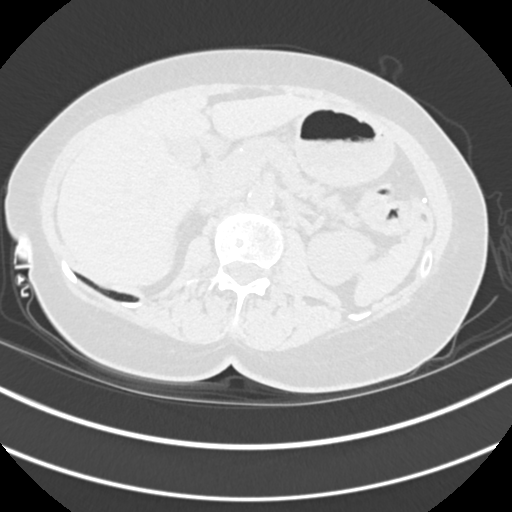
[im 26/165  lung]
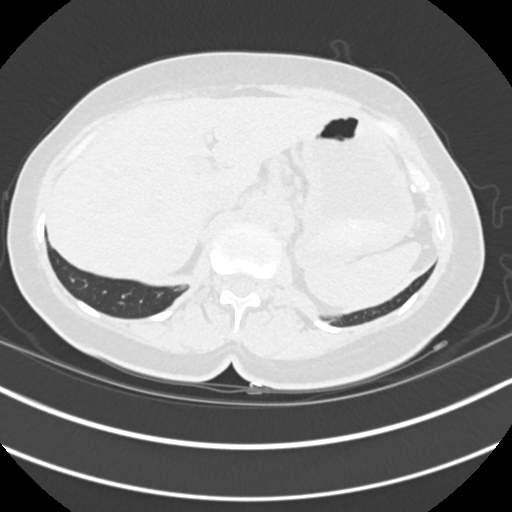
[im 38/165  lung]
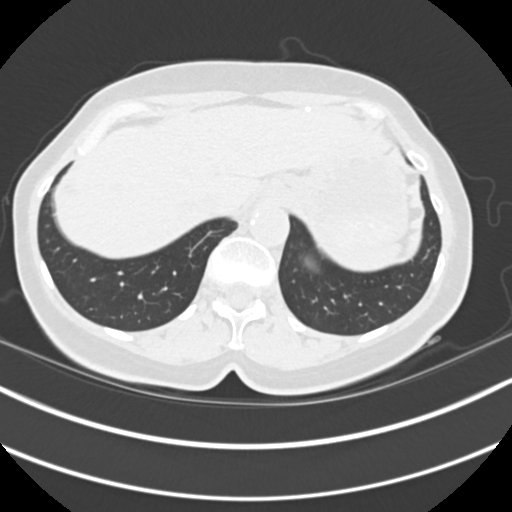
[im 51/165  lung]
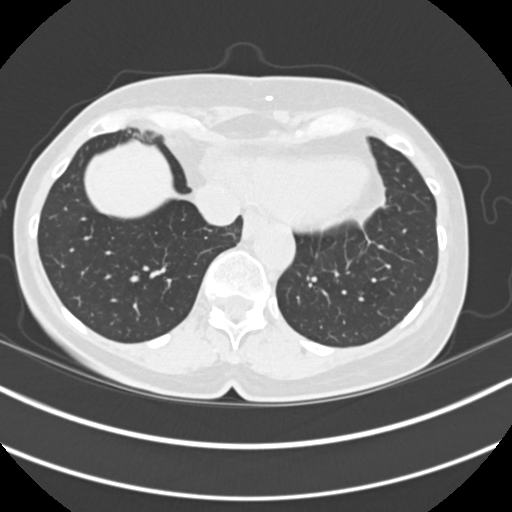
[im 64/165  mediastinal]
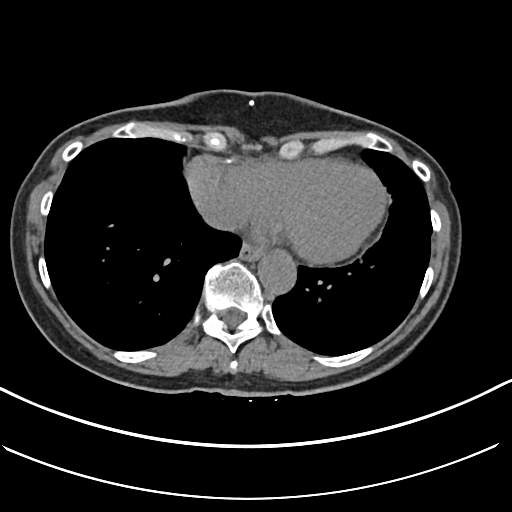
[im 64/165  lung]
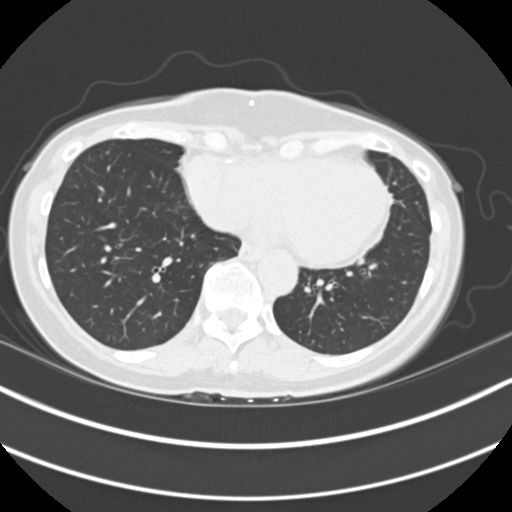
[im 76/165  lung]
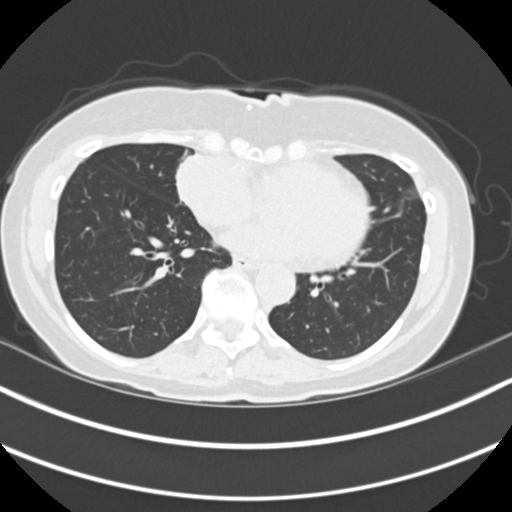
[im 89/165  lung]
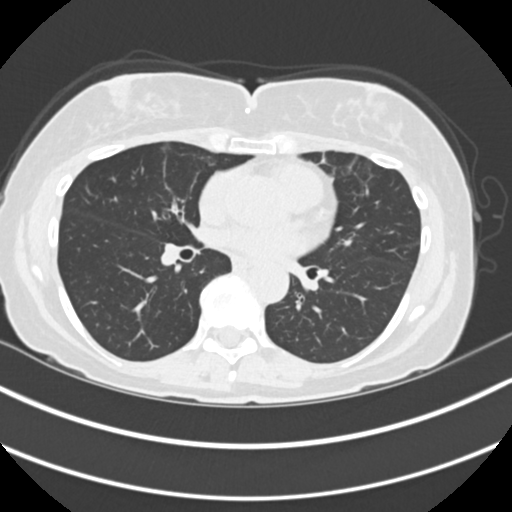
[im 101/165  lung]
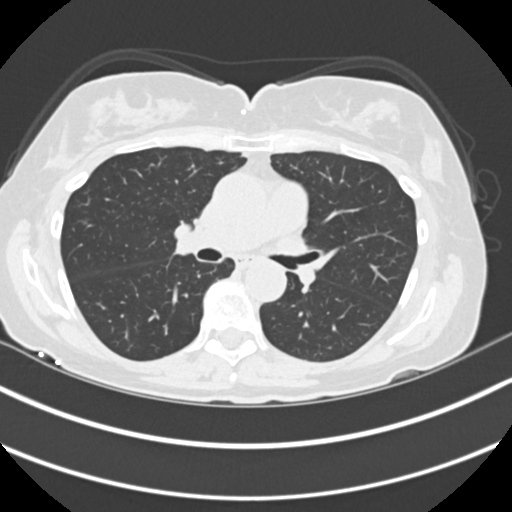
[im 114/165  mediastinal]
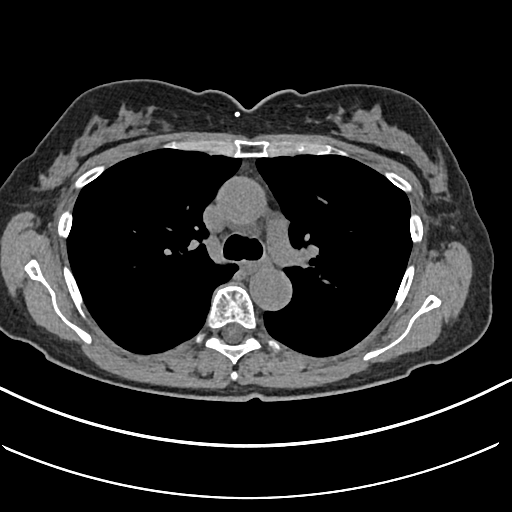
[im 114/165  lung]
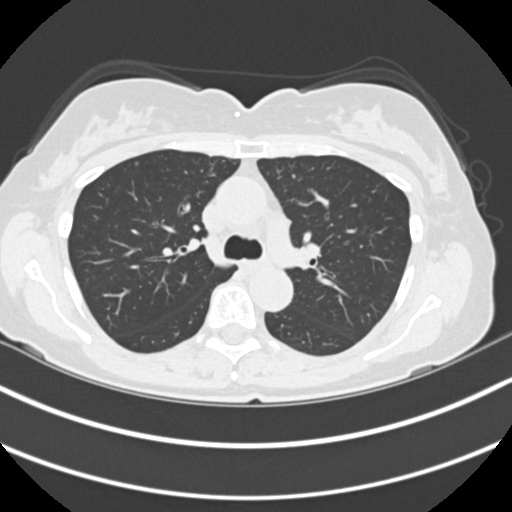
[im 127/165  lung]
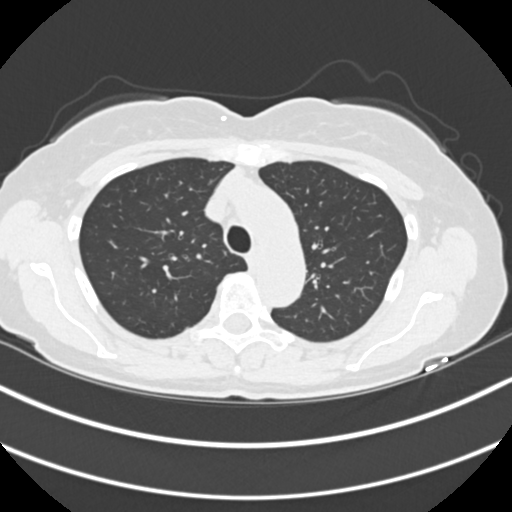
[im 139/165  lung]
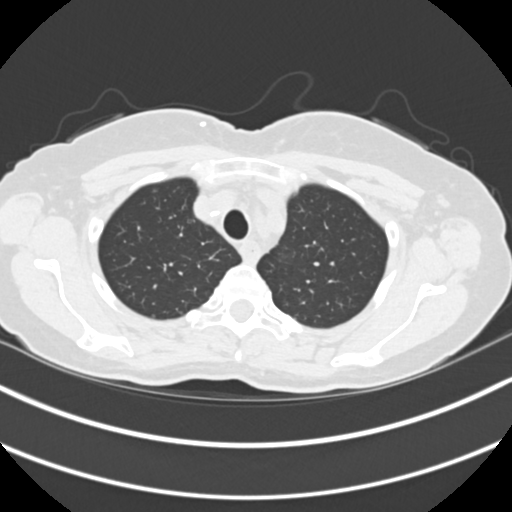
[im 152/165  lung]
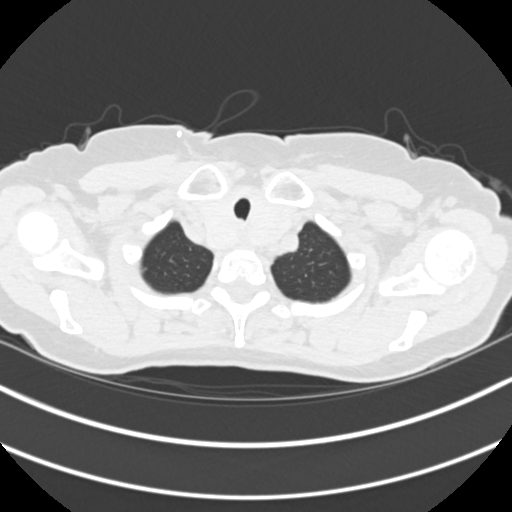

[Series 5: coronals chest 2.00 cor · coronal · 0.65mm/px · 3 of 159 slices shown]
[im 32/159  lung]
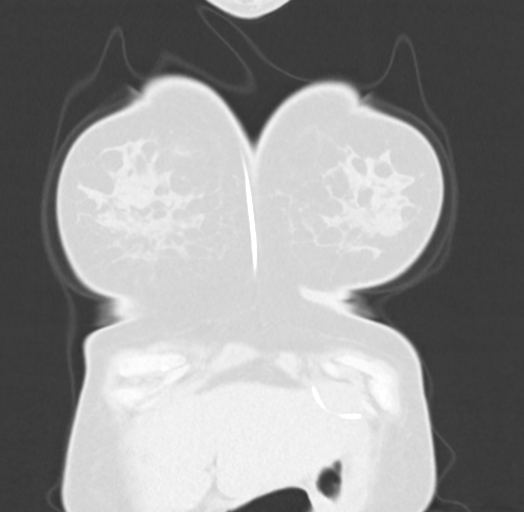
[im 64/159  lung]
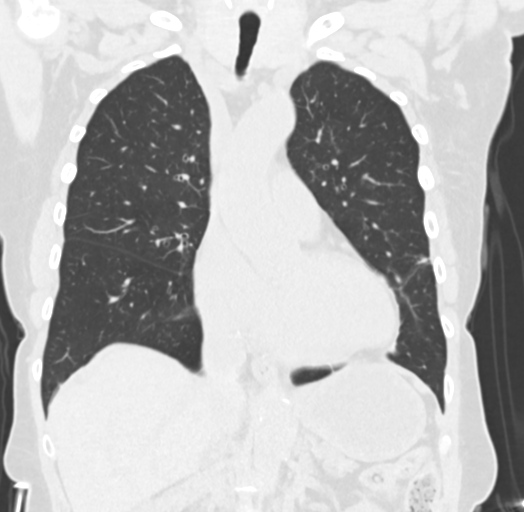
[im 95/159  lung]
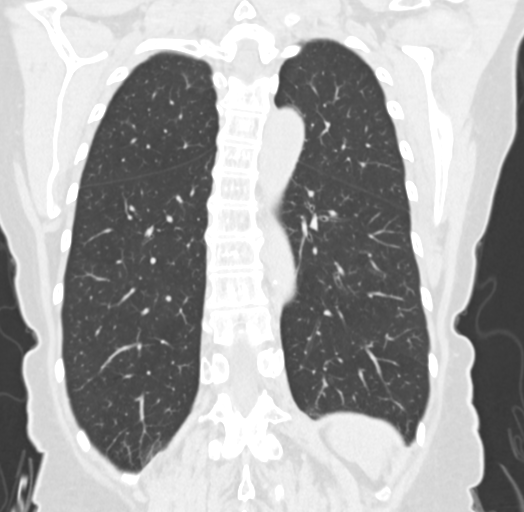

[15 of 36 positions shown; findings below may reference images not displayed]

FINDINGS: Cardiovascular: Aortic and branch vessel atherosclerosis without
aneurysmal dilation. Normal size heart. No significant pericardial
effusion/thickening.

Mediastinum/Nodes: Heterogeneous enlargement of the thyroid gland.
Recommend thyroid US (ref: [HOSPITAL]. [DATE]):
143-50).No pathologically enlarged mediastinal, hilar or axillary
lymph nodes, noting limited sensitivity for the detection of hilar
adenopathy on this noncontrast study. The esophagus is grossly
unremarkable.

Lungs/Pleura: Ground-glass pulmonary nodule in the right lower lobe
measures 10 mm on image 80/3, previously 11 mm. No new suspicious
pulmonary nodules or masses. Mild biapical pleuroparenchymal
scarring. Mild diffuse bronchial wall thickening. Atelectasis versus
scarring in the lingula and right middle lobe.

Upper Abdomen: No acute abnormality.

Musculoskeletal: Thoracolumbar spondylosis. No acute osseous
abnormality.
IMPRESSION: 1. No significant interval change in the 10 mm right lower lobe
ground-glass pulmonary nodule.
Possibly infectious or inflammatory. However, adenocarcinoma cannot
be excluded. Follow up by CT is recommended in 12 months, with
continued annual surveillance for a minimum of 3 years.
These recommendations are taken from:
Recommendations for the Management of Subsolid Pulmonary Nodules
Detected at CT: A Statement from the [HOSPITAL]
2. Heterogeneous enlargement of the thyroid gland. Recommend thyroid
US.
3. Aortic Atherosclerosis ([C2]-[C2]).

## 2021-07-11 ENCOUNTER — Telehealth: Payer: Self-pay | Admitting: Pulmonary Disease

## 2021-07-11 DIAGNOSIS — R911 Solitary pulmonary nodule: Secondary | ICD-10-CM

## 2021-07-11 NOTE — Telephone Encounter (Signed)
Martha Pita, MD  ?07/10/2021  4:49 PM EDT   ?  ?Chest CT shows that the small area in the lung that was noted is about the same size.  Recommendation is to repeat CT in 1 year.  ? ? ?Patient is aware of results and voiced his understanding.  ?CT has been ordered.  ?Nothing further needed.  ?

## 2021-08-06 ENCOUNTER — Other Ambulatory Visit: Payer: Self-pay

## 2021-08-06 DIAGNOSIS — Z86010 Personal history of colonic polyps: Secondary | ICD-10-CM

## 2021-08-06 MED ORDER — NA SULFATE-K SULFATE-MG SULF 17.5-3.13-1.6 GM/177ML PO SOLN: 1.0000 | Freq: Once | ORAL | 0 refills | Status: AC

## 2021-08-06 NOTE — Progress Notes (Signed)
Gastroenterology Pre-Procedure Review ? ?Request Date: 08/21/2021 ?Requesting Physician: Dr. Vicente Males ? ?PATIENT REVIEW QUESTIONS: The patient responded to the following health history questions as indicated:   ? ?1. Are you having any GI issues? no ?2. Do you have a personal history of Polyps? yes (last colonoscopy ) ?3. Do you have a family history of Colon Cancer or Polyps? no ?4. Diabetes Mellitus? no ?5. Joint replacements in the past 12 months?no ?6. Major health problems in the past 3 months?no ?7. Any artificial heart valves, MVP, or defibrillator?no ?   ?MEDICATIONS & ALLERGIES:    ?Patient reports the following regarding taking any anticoagulation/antiplatelet therapy:   ?Plavix, Coumadin, Eliquis, Xarelto, Lovenox, Pradaxa, Brilinta, or Effient? no ?Aspirin? yes (81 mg) ? ?Patient confirms/reports the following medications:  ?Current Outpatient Medications  ?Medication Sig Dispense Refill  ? acetaminophen (TYLENOL) 500 MG tablet Take 1,000 mg by mouth every 6 (six) hours as needed for moderate pain or headache.    ? aspirin EC 81 MG tablet Take 1 tablet (81 mg total) by mouth daily. Swallow whole. 30 tablet 5  ? atorvastatin (LIPITOR) 20 MG tablet Take 1 tablet (20 mg total) by mouth daily. 90 tablet 1  ? Cyanocobalamin (B-12) 2500 MCG TABS Take 2,500 mcg by mouth daily.    ? methimazole (TAPAZOLE) 5 MG tablet Take 0.5 tablets (2.5 mg total) by mouth at bedtime. 90 tablet 1  ? Multiple Vitamins-Minerals (PRESERVISION AREDS 2) CAPS Take 1 capsule by mouth 2 (two) times daily.    ? Polyvinyl Alcohol-Povidone (REFRESH OP) Place 1 drop into both eyes 2 (two) times daily as needed (dry eye).    ? ?No current facility-administered medications for this visit.  ? ? ?Patient confirms/reports the following allergies:  ?Allergies  ?Allergen Reactions  ? Bee Venom Anaphylaxis  ? Latex Itching  ? Penicillins   ?  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 Adhesive [Tape] Rash  ?   Band-aids  ? ? ?No orders of the defined types were placed in this encounter. ? ? ?AUTHORIZATION INFORMATION ?Primary Insurance: ?1D#: ?Group #: ? ?Secondary Insurance: ?1D#: ?Group #: ? ?SCHEDULE INFORMATION: ?Date: 08/21/2021 ?Time: ?Location:[facility] ? ?

## 2021-08-07 ENCOUNTER — Other Ambulatory Visit (INDEPENDENT_AMBULATORY_CARE_PROVIDER_SITE_OTHER): Payer: Medicare HMO

## 2021-08-07 DIAGNOSIS — D582 Other hemoglobinopathies: Secondary | ICD-10-CM

## 2021-08-07 DIAGNOSIS — R7303 Prediabetes: Secondary | ICD-10-CM

## 2021-08-07 DIAGNOSIS — R350 Frequency of micturition: Secondary | ICD-10-CM | POA: Diagnosis not present

## 2021-08-07 LAB — CBC WITH DIFFERENTIAL/PLATELET
Basophils Absolute: 0.1 10*3/uL (ref 0.0–0.1)
Basophils Relative: 1.2 % (ref 0.0–3.0)
Eosinophils Absolute: 0.1 10*3/uL (ref 0.0–0.7)
Eosinophils Relative: 1.6 % (ref 0.0–5.0)
HCT: 44.2 % (ref 36.0–46.0)
Hemoglobin: 14.9 g/dL (ref 12.0–15.0)
Lymphocytes Relative: 31.6 % (ref 12.0–46.0)
Lymphs Abs: 2.2 10*3/uL (ref 0.7–4.0)
MCHC: 33.6 g/dL (ref 30.0–36.0)
MCV: 101.9 fl — ABNORMAL HIGH (ref 78.0–100.0)
Monocytes Absolute: 0.6 10*3/uL (ref 0.1–1.0)
Monocytes Relative: 8.9 % (ref 3.0–12.0)
Neutro Abs: 3.9 10*3/uL (ref 1.4–7.7)
Neutrophils Relative %: 56.7 % (ref 43.0–77.0)
Platelets: 294 10*3/uL (ref 150.0–400.0)
RBC: 4.34 Mil/uL (ref 3.87–5.11)
RDW: 13.8 % (ref 11.5–15.5)
WBC: 7 10*3/uL (ref 4.0–10.5)

## 2021-08-07 LAB — URINALYSIS, ROUTINE W REFLEX MICROSCOPIC
Bilirubin Urine: NEGATIVE
Ketones, ur: NEGATIVE
Nitrite: NEGATIVE
Specific Gravity, Urine: 1.02 (ref 1.000–1.030)
Total Protein, Urine: NEGATIVE
Urine Glucose: NEGATIVE
Urobilinogen, UA: 0.2 (ref 0.0–1.0)
pH: 5.5 (ref 5.0–8.0)

## 2021-08-07 LAB — COMPREHENSIVE METABOLIC PANEL
ALT: 19 U/L (ref 0–35)
AST: 20 U/L (ref 0–37)
Albumin: 4.4 g/dL (ref 3.5–5.2)
Alkaline Phosphatase: 122 U/L — ABNORMAL HIGH (ref 39–117)
BUN: 18 mg/dL (ref 6–23)
CO2: 26 mEq/L (ref 19–32)
Calcium: 9.3 mg/dL (ref 8.4–10.5)
Chloride: 104 mEq/L (ref 96–112)
Creatinine, Ser: 1.22 mg/dL — ABNORMAL HIGH (ref 0.40–1.20)
GFR: 44.31 mL/min — ABNORMAL LOW (ref >60.00)
Glucose, Bld: 82 mg/dL (ref 70–99)
Potassium: 4.5 mEq/L (ref 3.5–5.1)
Sodium: 139 mEq/L (ref 135–145)
Total Bilirubin: 0.6 mg/dL (ref 0.2–1.2)
Total Protein: 7.4 g/dL (ref 6.0–8.3)

## 2021-08-07 LAB — HEMOGLOBIN A1C: Hgb A1c MFr Bld: 6 % (ref 4.6–6.5)

## 2021-08-08 ENCOUNTER — Telehealth: Payer: Self-pay

## 2021-08-08 LAB — URINE CULTURE
MICRO NUMBER:: 13340072
Result:: NO GROWTH
SPECIMEN QUALITY:: ADEQUATE

## 2021-08-08 NOTE — Telephone Encounter (Signed)
Rescheduled patient called endo sent new letters and and new referral  ?

## 2021-08-09 ENCOUNTER — Telehealth: Payer: Self-pay

## 2021-08-09 NOTE — Telephone Encounter (Signed)
Rescheduled patient sent new letters and new referral called endo ?

## 2021-09-13 ENCOUNTER — Encounter: Payer: Self-pay | Admitting: Podiatry

## 2021-09-13 ENCOUNTER — Ambulatory Visit: Payer: Medicare HMO | Admitting: Podiatry

## 2021-09-13 DIAGNOSIS — M79674 Pain in right toe(s): Secondary | ICD-10-CM

## 2021-09-13 DIAGNOSIS — B351 Tinea unguium: Secondary | ICD-10-CM

## 2021-09-13 DIAGNOSIS — M79675 Pain in left toe(s): Secondary | ICD-10-CM

## 2021-09-13 NOTE — Progress Notes (Signed)
Subjective:  Patient ID: Martha Wilson, female    DOB: 1950-10-19,  MRN: [id number]  Chief Complaint  Patient presents with   Nail Problem    Routine foot care    71 y.o. female presents with the above complaint.  Patient presents with thickened elongated dystrophic toenails x10.  Pain to palpation.  They are not able to do with her themselves.  They would like for [place] to debride them down.  She does not have any secondary complaints at this time.  Review of Systems: Negative except as noted in the HPI. Denies N/V/F/Ch.  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 Swallow whole., Disp: 30 tablet, Rfl: 5   atorvastatin (LIPITOR) 20 MG tablet, Take 1 tablet (20 mg total) by mouth daily., Disp: 90 tablet, Rfl: 1   Cyanocobalamin (B-12) 2500 MCG TABS, Take 2,500 mcg by mouth daily., Disp: , Rfl:    methimazole (TAPAZOLE) 5 MG tablet, Take 0.5 tablets (2.5 mg total) by mouth at bedtime., Disp: 90 tablet, Rfl: 1   Multiple Vitamins-Minerals (PRESERVISION AREDS 2) CAPS, Take 1 capsule by mouth 2 (two) times daily., Disp: , Rfl:    Polyvinyl Alcohol-Povidone (REFRESH OP), Place 1 drop into both eyes 2 (two) times daily as needed (dry eye)., Disp: , Rfl:   Social History   Tobacco Use  Smoking Status Former    Packs/day: 1.00   Years: 35.00   Total pack years: 35.00   Types: Cigarettes  Smokeless Tobacco Never  Tobacco Comments   "occasional" cigarette    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 There is no height or weight on file to calculate BMI. Constitutional Well developed. Well nourished.  Vascular Dorsalis pedis pulses palpable bilaterally. Posterior tibial pulses palpable bilaterally. Capillary refill normal to all digits.  No cyanosis or clubbing noted. Pedal hair growth normal.  Neurologic Normal speech. Oriented to person, place, and time. Epicritic sensation to light touch grossly present bilaterally.  Dermatologic  dystrophic mycotic nail noted to the left hallux that seems to be slightly more severe than the ones listed below.  Mild pain on palpation. Nail examination shows thickened elongated dystrophic mycotic nails x10.  Pain on palpation Skin within normal limits  Orthopedic: Normal joint ROM without pain or crepitus bilaterally. No visible deformities. No bony tenderness.   Radiographs: None Assessment:   No diagnosis found.      Plan:  Patient  was evaluated and treated and all questions answered.   Onychomycosis with pain  -Nails palliatively debrided as below. -Educated on self-care  Procedure: Nail Debridement Rationale: pain  Type of Debridement: manual, sharp debridement. Instrumentation: Nail nipper, rotary burr. Number of Nails: 10  Procedures and Treatment: Consent by patient was obtained for treatment procedures. The patient understood the discussion of treatment and procedures well. All questions were answered thoroughly reviewed. Debridement of mycotic and hypertrophic toenails, 1 through 5 bilateral and clearing  of subungual debris. No ulceration, no infection noted.  Return Visit-Office Procedure: Patient instructed to return to the office for a follow up visit 3 months for continued evaluation and treatment.  Boneta Lucks, DPM    No follow-ups on file.   No follow-ups on file.

## 2021-09-21 ENCOUNTER — Other Ambulatory Visit: Payer: Self-pay

## 2021-09-21 ENCOUNTER — Ambulatory Visit: Payer: Medicare HMO | Admitting: Certified Registered"

## 2021-09-21 ENCOUNTER — Encounter: Payer: Self-pay | Admitting: Gastroenterology

## 2021-09-21 ENCOUNTER — Ambulatory Visit
Admission: RE | Admit: 2021-09-21 | Discharge: 2021-09-21 | Disposition: A | Discharge status: Home / Self Care | Payer: Medicare HMO | Attending: Gastroenterology | Admitting: Gastroenterology

## 2021-09-21 ENCOUNTER — Encounter
Admission: RE | Disposition: A | Discharge status: Home / Self Care | Payer: Self-pay | Source: Home / Self Care | Attending: Gastroenterology

## 2021-09-21 DIAGNOSIS — D123 Benign neoplasm of transverse colon: Secondary | ICD-10-CM | POA: Insufficient documentation

## 2021-09-21 DIAGNOSIS — I2510 Atherosclerotic heart disease of native coronary artery without angina pectoris: Secondary | ICD-10-CM | POA: Insufficient documentation

## 2021-09-21 DIAGNOSIS — K5730 Diverticulosis of large intestine without perforation or abscess without bleeding: Secondary | ICD-10-CM | POA: Insufficient documentation

## 2021-09-21 DIAGNOSIS — E0590 Thyrotoxicosis, unspecified without thyrotoxic crisis or storm: Secondary | ICD-10-CM | POA: Diagnosis not present

## 2021-09-21 DIAGNOSIS — J449 Chronic obstructive pulmonary disease, unspecified: Secondary | ICD-10-CM | POA: Insufficient documentation

## 2021-09-21 DIAGNOSIS — Z1211 Encounter for screening for malignant neoplasm of colon: Secondary | ICD-10-CM | POA: Insufficient documentation

## 2021-09-21 DIAGNOSIS — Z86010 Personal history of colonic polyps: Secondary | ICD-10-CM | POA: Insufficient documentation

## 2021-09-21 DIAGNOSIS — Z85828 Personal history of other malignant neoplasm of skin: Secondary | ICD-10-CM | POA: Diagnosis not present

## 2021-09-21 DIAGNOSIS — Z8614 Personal history of Methicillin resistant Staphylococcus aureus infection: Secondary | ICD-10-CM | POA: Diagnosis not present

## 2021-09-21 DIAGNOSIS — D128 Benign neoplasm of rectum: Secondary | ICD-10-CM | POA: Insufficient documentation

## 2021-09-21 DIAGNOSIS — D122 Benign neoplasm of ascending colon: Secondary | ICD-10-CM | POA: Diagnosis not present

## 2021-09-21 HISTORY — PX: COLONOSCOPY WITH PROPOFOL: SHX5780

## 2021-09-21 SURGERY — COLONOSCOPY WITH PROPOFOL
Anesthesia: General

## 2021-09-21 MED ORDER — LIDOCAINE HCL (CARDIAC) PF 100 MG/5ML IV SOSY
PREFILLED_SYRINGE | INTRAVENOUS | Status: DC | PRN
  Administered 2021-09-21: 50 mg via INTRAVENOUS

## 2021-09-21 MED ORDER — SODIUM CHLORIDE 0.9 % IV SOLN: INTRAVENOUS | Status: DC

## 2021-09-21 MED ORDER — PROPOFOL 500 MG/50ML IV EMUL
INTRAVENOUS | Status: DC | PRN
  Administered 2021-09-21: 150 ug/kg/min via INTRAVENOUS

## 2021-09-21 MED ORDER — PROPOFOL 10 MG/ML IV BOLUS
INTRAVENOUS | Status: DC | PRN
  Administered 2021-09-21: 50 mg via INTRAVENOUS

## 2021-09-21 MED ORDER — PROPOFOL 1000 MG/100ML IV EMUL
INTRAVENOUS | Status: AC
  Filled 2021-09-21: qty 100

## 2021-09-21 NOTE — Anesthesia Postprocedure Evaluation (Signed)
Anesthesia Post Note  Patient: Martha Wilson  Procedure(s) Performed: COLONOSCOPY WITH PROPOFOL  Patient location during evaluation: Endoscopy Anesthesia Type: General Level of consciousness: awake and alert Pain management: pain level controlled Vital Signs Assessment: post-procedure vital signs reviewed and stable Respiratory status: spontaneous breathing, nonlabored ventilation, respiratory function stable and patient connected to nasal cannula oxygen Cardiovascular status: blood pressure returned to baseline and stable Postop Assessment: no apparent nausea or vomiting Anesthetic complications: no   No notable events documented.   Last Vitals:  Vitals:   09/21/21 0836 09/21/21 0840  BP:  122/76  Pulse:    Resp: 18 18  Temp:    SpO2:      Last Pain:  Vitals:   09/21/21 0834  TempSrc: Temporal  PainSc:                  [name]

## 2021-09-21 NOTE — Transfer of Care (Signed)
Immediate Anesthesia Transfer of Care Note  Patient: Martha Wilson  Procedure(s) Performed: COLONOSCOPY WITH PROPOFOL  Patient Location: PACU and Endoscopy Unit  Anesthesia Type:General  Level of Consciousness: awake, drowsy and patient cooperative  Airway & Oxygen Therapy: Patient Spontanous Breathing  Post-op Assessment: Report given to RN and Post -op Vital signs reviewed and stable  Post vital signs: Reviewed and stable  Last Vitals:  Vitals Value Taken Time  BP 113/75 09/21/21 0819  Temp    Pulse 75 09/21/21 0819  Resp 28 09/21/21 0819  SpO2 99 % 09/21/21 0819    Last Pain:  Vitals:   09/21/21 0731  TempSrc: Temporal  PainSc: 0-No pain         Complications: No notable events documented.

## 2021-09-21 NOTE — Anesthesia Procedure Notes (Signed)
Procedure Name: MAC Date/Time: 09/21/2021 7:50 AM  Performed by: Jerrye Noble, CRNAPre-anesthesia Checklist: Patient identified, Emergency Drugs available, Suction available and Patient being monitored Patient Re-evaluated:Patient Re-evaluated prior to induction Oxygen Delivery Method: Nasal cannula

## 2021-09-21 NOTE — Op Note (Signed)
[facility] Gastroenterology Patient Name: Martha Wilson Procedure Date: 09/21/2021 7:50 AM MRN: [id number] Account #: [id number] Date of Birth: Feb 23, 1951 Admit Type: Outpatient Age: 71 Room: [facility] ENDO ROOM 4 Gender: Female Note Status: Finalized Instrument Name: Jasper Riling [id number] Procedure:             Colonoscopy Indications:           Surveillance: Personal history of adenomatous polyps                         on last colonoscopy 3 years ago Providers:             Jonathon Bellows MD, MD Referring MD:          Deborra Medina, MD (Referring MD) Medicines:             Monitored Anesthesia Care Complications:         No immediate complications. Procedure:             Pre-Anesthesia Assessment:                        - Prior to the procedure, a History and Physical was                         performed, and patient medications, allergies and                         sensitivities were reviewed. The patient's tolerance                         of previous anesthesia was reviewed.                        - The risks and benefits of the procedure and the                         sedation options and risks were discussed with the                         patient. All questions were answered and informed                         consent was obtained.                        - ASA Grade Assessment: II - A patient with mild                         systemic disease.                        After obtaining informed consent, the colonoscope was                         passed under direct vision. Throughout the procedure,                         the patient's blood pressure, pulse, and oxygen                         saturations  were monitored continuously. The                         Colonoscope was introduced through the anus and                         advanced to the the cecum, identified by the                         appendiceal orifice. The colonoscopy was performed                          with ease. The patient tolerated the procedure well.                         The quality of the bowel preparation was excellent. Findings:      The perianal and digital rectal examinations were normal.      Three sessile polyps were found in the rectum, transverse colon and       ascending colon. The polyps were 4 to 6 mm in size. These polyps were       removed with a cold snare. Resection and retrieval were complete.      Multiple small-mouthed diverticula were found in the sigmoid colon.      The exam was otherwise without abnormality on direct and retroflexion       views. Impression:            - Three 4 to 6 mm polyps in the rectum, in the                         transverse colon and in the ascending colon, removed                         with a cold snare. Resected and retrieved.                        - Diverticulosis in the sigmoid colon.                        - The examination was otherwise normal on direct and                         retroflexion views. Recommendation:        - Discharge patient to home (with escort).                        - Resume previous diet.                        - Continue present medications.                        - Await pathology results.                        - Repeat colonoscopy is not recommended due to current                         age (39 years or older) for surveillance. Procedure Code(s):     ---  Professional ---                        [phone], Colonoscopy, flexible; with removal of                         tumor(s), polyp(s), or other lesion(s) by snare                         technique Diagnosis Code(s):     --- Professional ---                        Z86.010, Personal history of colonic polyps                        K62.1, Rectal polyp                        K63.5, Polyp of colon                        K57.30, Diverticulosis of large intestine without                         perforation or abscess without bleeding CPT  copyright 2019 American Medical Association. All rights reserved. The codes documented in this report are preliminary and upon coder review may  be revised to meet current compliance requirements. Jonathon Bellows, MD Jonathon Bellows MD, MD 09/21/2021 8:16:36 AM This report has been signed electronically. Number of Addenda: 0 Note Initiated On: 09/21/2021 7:50 AM Scope Withdrawal Time: 0 hours 12 minutes 22 seconds  Total Procedure Duration: 0 hours 16 minutes 23 seconds  Estimated Blood Loss:  Estimated blood loss: none.      [facility]

## 2021-09-21 NOTE — H&P (Signed)
Jonathon Bellows, MD [address], [place], [place], [place], [address] [address], [place], [place], [place], [address] Phone: [phone]  Fax: [phone]  Primary Care Physician:  [name], MD   Pre-Procedure History & Physical: HPI:  Martha Wilson is a 71 y.o. female is here for an colonoscopy.   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   Tricuspid valve prolapse    Wears dentures    partial upper and lower    Past Surgical History:  Procedure Laterality Date   APPLICATION OF CRANIAL NAVIGATION N/A 05/31/2020   Procedure: APPLICATION OF CRANIAL NAVIGATION;  Surgeon: Meade Maw, MD;  Location: [facility] ORS;  Service: Neurosurgery;  Laterality: N/A;   BREAST BIOPSY  1980's   pt states she had a bx in the 80's, not sure of side, benign   breat biopsy     CATARACT EXTRACTION W/PHACO Right 09/22/2018   Procedure: CATARACT EXTRACTION PHACO AND INTRAOCULAR LENS PLACEMENT ([place])  RIGHT panooptix;  Surgeon: Birder Robson, MD;  Location: [place];  Service: Ophthalmology;  Laterality: Right;   CATARACT EXTRACTION W/PHACO Left 10/06/2018   Procedure: CATARACT EXTRACTION PHACO AND INTRAOCULAR LENS PLACEMENT ([employer]) LEFT PANOPTIX LENS;  Surgeon: Birder Robson, MD;  Location: [place];  Service: Ophthalmology;  Laterality: Left;   COLONOSCOPY     COLONOSCOPY WITH PROPOFOL N/A 11/19/2018   Procedure: COLONOSCOPY WITH PROPOFOL;  Surgeon: Jonathon Bellows, MD;  Location: [facility] ENDOSCOPY;  Service: Gastroenterology;  Laterality: N/A;   ORIF FOOT FRACTURE     VENTRICULOPERITONEAL SHUNT  Right 05/31/2020   Procedure: SHUNT INSERTION VENTRICULAR-PERITONEAL;  Surgeon: Meade Maw, MD;  Location: [facility] ORS;  Service: Neurosurgery;  Laterality: Right;  Dr Lysle Pearl to assist    Prior to Admission medications   Medication Sig Start Date End Date Taking? Authorizing Provider  acetaminophen (TYLENOL) 500 MG tablet Take 1,000 mg by mouth every 6 (six) hours as needed for moderate pain or headache.   Yes [provider]  aspirin EC 81 MG tablet Take 1 tablet (81 mg total) by mouth daily. Swallow whole. 04/03/20  Yes Agbor-Etang, Aaron Edelman, MD  atorvastatin (LIPITOR) 20 MG tablet Take 1 tablet (20 mg total) by mouth daily. 05/11/21  Yes Agbor-Etang, Aaron Edelman, MD  Cyanocobalamin (B-12) 2500 MCG TABS Take 2,500 mcg by mouth daily.   Yes [provider]  methimazole (TAPAZOLE) 5 MG tablet Take 0.5 tablets (2.5 mg total) by mouth at bedtime. 12/29/20  Yes [name], MD  Multiple Vitamins-Minerals (PRESERVISION AREDS 2) CAPS Take 1 capsule by mouth 2 (two) times daily.   Yes [provider]  Polyvinyl Alcohol-Povidone (REFRESH OP) Place 1 drop into both eyes 2 (two) times daily as needed (dry eye).   Yes [provider]    Allergies as of 08/06/2021 - Review Complete 08/06/2021  Allergen Reaction Noted   Bee venom Anaphylaxis 01/16/2016   Latex Itching 04/26/2020   Penicillins  04/20/2018   Adhesive [tape] Rash 09/15/2018    Family History  Problem Relation Age of Onset  Heart attack Father    Thyroid disease Brother    Early death Brother     Social History   Socioeconomic History   Marital status: Single    Spouse name: Not on file   Number of children: Not on file   Years of education: Not on file   Highest education level: Not on file  Occupational History   Not on file  Tobacco Use   Smoking status: Every Day    Packs/day: 0.50    Years: 35.00    Total pack years: 17.50    Types: Cigarettes   Smokeless tobacco: Never   Tobacco  comments:    "occasional" cigarette  Vaping Use   Vaping Use: Never used  Substance and Sexual Activity   Alcohol use: Yes    Alcohol/week: 3.0 standard drinks of alcohol    Types: 3 Glasses of wine per week    Comment: OCC   Drug use: Never   Sexual activity: Not Currently  Other Topics Concern   Not on file  Social History Narrative   Not on file   Social Determinants of Health   Financial Resource Strain: Low Risk  (11/16/2018)   Overall Financial Resource Strain (CARDIA)    Difficulty of Paying Living Expenses: Not hard at all  Food Insecurity: No Food Insecurity (11/16/2018)   Hunger Vital Sign    Worried About Running Out of Food in the Last Year: Never true    Ran Out of Food in the Last Year: Never true  Transportation Needs: No Transportation Needs (11/16/2018)   PRAPARE - Hydrologist (Medical): No    Lack of Transportation (Non-Medical): No  Physical Activity: Insufficiently Active (11/16/2018)   Exercise Vital Sign    Days of Exercise per Week: 7 days    Minutes of Exercise per Session: 20 min  Stress: No Stress Concern Present (11/16/2018)   Impact    Feeling of Stress : Not at all  Social Connections: Not on file  Intimate Partner Violence: Not on file    Review of Systems: See HPI, otherwise negative ROS  Physical Exam: BP 110/81   Pulse 80   Temp (!) 97.5 F (36.4 C) (Temporal)   Resp 18   Ht '5\' 5"'$  (1.651 m)   Wt 59.9 kg   SpO2 99%   BMI 21.97 kg/m  General:   Alert,  pleasant and cooperative in NAD Head:  Normocephalic and atraumatic. Neck:  Supple; no masses or thyromegaly. Lungs:  Clear throughout to auscultation, normal respiratory effort.    Heart:  +S1, +S2, Regular rate and rhythm, No edema. Abdomen:  Soft, nontender and nondistended. Normal bowel sounds, without guarding, and without rebound.   Neurologic:  Alert and  oriented x4;  grossly  normal neurologically.  Impression/Plan: Martha Wilson is here for an colonoscopy to be performed for surveillance due to prior history of colon polyps   Risks, benefits, limitations, and alternatives regarding  colonoscopy have been reviewed with the patient.  Questions have been answered.  All parties agreeable.   Jonathon Bellows, MD  09/21/2021, 7:50 AM

## 2021-09-21 NOTE — Anesthesia Preprocedure Evaluation (Signed)
Anesthesia Evaluation  Patient identified by MRN, date of birth, ID band Patient awake    Reviewed: Allergy & Precautions, NPO status , Patient's Chart, lab work & pertinent test results  History of Anesthesia Complications Negative for: history of anesthetic complications  Airway Mallampati: III  TM Distance: <3 FB Neck ROM: full    Dental  (+) Chipped, Partial Upper, Partial Lower, Implants   Pulmonary neg shortness of breath, COPD, Current Smoker and Patient abstained from smoking.,    Pulmonary exam normal        Cardiovascular Exercise Tolerance: Good (-) angina+ CAD  Normal cardiovascular exam     Neuro/Psych  Headaches,  Neuromuscular disease negative psych ROS   GI/Hepatic negative GI ROS, Neg liver ROS, neg GERD  ,  Endo/Other  Hyperthyroidism   Renal/GU negative Renal ROS  negative genitourinary   Musculoskeletal   Abdominal   Peds  Hematology negative hematology ROS (+)   Anesthesia Other Findings Past Medical History: No date: Allergies No date: Arthritis     Comment:  right knee No date: Headache No date: History of methicillin resistant staphylococcus aureus (MRSA) No date: Hyperthyroidism No date: MVP (mitral valve prolapse) No date: Normal pressure hydrocephalus (HCC) No date: Osteopenia No date: Palpitations     Comment:  "stress related" No date: Skin cancer     Comment:  BASAL CELL 06/2020: Status post ventriculoperitoneal shunt     Comment:  by Elayne Guerin for NPH No date: Tricuspid valve prolapse No date: Wears dentures     Comment:  partial upper and lower  Past Surgical History: 8/34/1962: APPLICATION OF CRANIAL NAVIGATION; N/A     Comment:  Procedure: APPLICATION OF CRANIAL NAVIGATION;  Surgeon:               Meade Maw, MD;  Location: [facility] ORS;  Service:               Neurosurgery;  Laterality: N/A; 1980's: BREAST BIOPSY     Comment:  pt states she had a bx in  the 80's, not sure of side,               benign No date: breat biopsy 09/22/2018: CATARACT EXTRACTION W/PHACO; Right     Comment:  Procedure: CATARACT EXTRACTION PHACO AND INTRAOCULAR               LENS PLACEMENT ([place])  RIGHT panooptix;  Surgeon:               Birder Robson, MD;  Location: [place];                Service: Ophthalmology;  Laterality: Right; 10/06/2018: CATARACT EXTRACTION W/PHACO; Left     Comment:  Procedure: CATARACT EXTRACTION PHACO AND INTRAOCULAR               LENS PLACEMENT ([place]) LEFT PANOPTIX LENS;  Surgeon:               Birder Robson, MD;  Location: [place];                Service: Ophthalmology;  Laterality: Left; No date: COLONOSCOPY 11/19/2018: COLONOSCOPY WITH PROPOFOL; N/A     Comment:  Procedure: COLONOSCOPY WITH PROPOFOL;  Surgeon: Jonathon Bellows, MD;  [facility]COPY;  Service:               Gastroenterology;  Laterality: N/A; No  date: ORIF FOOT FRACTURE 05/31/2020: VENTRICULOPERITONEAL SHUNT; Right     Comment:  Procedure: Uncertain;                Surgeon: Meade Maw, MD;  Location: [facility] ORS;                Service: Neurosurgery;  Laterality: Right;  Dr Lysle Pearl to               assist     Reproductive/Obstetrics negative OB ROS                             Anesthesia Physical Anesthesia Plan  ASA: 3  Anesthesia Plan: General   Post-op Pain Management:    Induction: Intravenous  PONV Risk Score and Plan: Propofol infusion and TIVA  Airway Management Planned: Natural Airway and Nasal Cannula  Additional Equipment:   Intra-op Plan:   Post-operative Plan:   Informed Consent: I have reviewed the patients History and Physical, chart, labs and discussed the procedure including the risks, benefits and alternatives for the proposed anesthesia with the patient or authorized representative who has indicated his/her understanding and  acceptance.     Dental Advisory Given  Plan Discussed with: Anesthesiologist, CRNA and Surgeon  Anesthesia Plan Comments: (Patient consented for risks of anesthesia including but not limited to:  - adverse reactions to medications - risk of airway placement if required - damage to eyes, teeth, lips or other oral mucosa - nerve damage due to positioning  - sore throat or hoarseness - Damage to heart, brain, nerves, lungs, other parts of body or loss of life  Patient voiced understanding.)        Anesthesia Quick Evaluation

## 2021-09-22 NOTE — Progress Notes (Signed)
Non-identified Voicemail.  No Message Left. 

## 2021-09-24 ENCOUNTER — Encounter: Payer: Self-pay | Admitting: Gastroenterology

## 2021-09-24 LAB — SURGICAL PATHOLOGY

## 2021-09-25 NOTE — Progress Notes (Signed)
Martha Wilson ,  I had told her that we wont repeat colonoscopy in my note but with 3 tubular adenomas I think she should have 1 last colonoscopy in 3 years please , please update health maintenance  C/c [name], MD (FYI)  Dr Jonathon Bellows MD,MRCP [facility]) Gastroenterology/Hepatology Pager: [phone]

## 2021-11-05 ENCOUNTER — Telehealth: Payer: Self-pay | Admitting: Internal Medicine

## 2021-11-05 NOTE — Telephone Encounter (Signed)
Copied from [place]. Topic: Medicare AWV >> Nov 05, 2021  3:02 PM Devoria Glassing wrote: Reason for CRM: Left message for patient to schedule Annual Wellness Visit.  Please schedule with Nurse Health Advisor Denisa O'Brien-Blaney, LPN at [facility]. This appt can be telephone or office visit.  Please call [phone] ask for [facility]

## 2021-11-07 ENCOUNTER — Telehealth: Payer: Self-pay | Admitting: Internal Medicine

## 2021-11-07 NOTE — Telephone Encounter (Signed)
Spoke with patient she req CB next week 11/13/21

## 2021-11-13 ENCOUNTER — Encounter: Payer: Self-pay | Admitting: Cardiology

## 2021-11-13 ENCOUNTER — Ambulatory Visit (INDEPENDENT_AMBULATORY_CARE_PROVIDER_SITE_OTHER): Payer: Medicare HMO | Admitting: Cardiology

## 2021-11-13 VITALS — BP 118/80 | HR 79 | Ht 65.5 in | Wt 133.0 lb

## 2021-11-13 DIAGNOSIS — I361 Nonrheumatic tricuspid (valve) insufficiency: Secondary | ICD-10-CM | POA: Diagnosis not present

## 2021-11-13 DIAGNOSIS — R9430 Abnormal result of cardiovascular function study, unspecified: Secondary | ICD-10-CM

## 2021-11-13 DIAGNOSIS — I2510 Atherosclerotic heart disease of native coronary artery without angina pectoris: Secondary | ICD-10-CM | POA: Diagnosis not present

## 2021-11-13 DIAGNOSIS — R002 Palpitations: Secondary | ICD-10-CM

## 2021-11-13 MED ORDER — ATORVASTATIN CALCIUM 20 MG PO TABS: 20.0000 mg | ORAL_TABLET | Freq: Every day | ORAL | 3 refills | Status: DC

## 2021-11-13 NOTE — Progress Notes (Signed)
Cardiology Office Note:    Date:  11/13/2021   ID:  Martha Wilson, DOB 1950/07/07, MRN [id number]  PCP:  [name], MD  [place] Cardiologist:  None  [facility] Electrophysiologist:  None   Referring MD: [name], MD   Chief Complaint  Patient presents with   Follow-up    6 month follow up. Patient states that she is doing ok today.  Meds reviewed with patient.     History of Present Illness:    Martha Wilson is a 71 y.o. 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    Being seen due to history of known obstructive CAD, mild to moderate TR.  Denies chest pain, has occasional palpitations whenever she gets nervous researchers watching the news or gets a phone call that frightens her, was concerned that her husband might have passed.  Otherwise feels okay, denies chest pain or palpitations without stress during ordinary activities.  Prior notes Coronary CTA 04/2021 mild RCA and proximal LAD stenosis. Echo 01/2021, EF 45%, mild to moderate TR left heart cath August 2014 20% proximal LAD lesion, mild diffuse disease in RCA.   Apparently has a history of tricuspid valve prolapse with mild RV dilatation.   Outside echo report 01/2020 showed normal systolic function, EF 54%.  Moderately enlarged RV, RV normal systolic function, RVSP 32 mmHg. Previous cardiologist was in [place].  She still gets yearly echocardiogram for tricuspid valve insufficiency with serial echocardiograms.  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   Tricuspid valve prolapse    Wears dentures    partial upper and lower    Past Surgical History:  Procedure Laterality Date   APPLICATION OF CRANIAL NAVIGATION N/A 05/31/2020   Procedure: APPLICATION OF CRANIAL NAVIGATION;  Surgeon: Meade Maw, MD;  Location: [facility]S;  Service: Neurosurgery;  Laterality: N/A;   BREAST BIOPSY  1980's   pt states she had a bx in the 80's, not sure of side, benign   breat biopsy     CATARACT EXTRACTION W/PHACO Right 09/22/2018   Procedure: CATARACT EXTRACTION PHACO AND INTRAOCULAR LENS PLACEMENT ([place])  RIGHT panooptix;  Surgeon: Birder Robson, MD;  Location: [place];  Service: Ophthalmology;  Laterality: Right;   CATARACT EXTRACTION W/PHACO Left 10/06/2018   Procedure: CATARACT EXTRACTION PHACO AND INTRAOCULAR LENS PLACEMENT ([place]) LEFT PANOPTIX LENS;  Surgeon: Birder Robson, MD;  Location: [place];  Service: Ophthalmology;  Laterality: Left;   COLONOSCOPY     COLONOSCOPY WITH PROPOFOL N/A 11/19/2018   Procedure: COLONOSCOPY WITH PROPOFOL;  Surgeon: Jonathon Bellows, MD;  Location: [facility] ENDOSCOPY;  Service: Gastroenterology;  Laterality: N/A;   COLONOSCOPY WITH PROPOFOL N/A 09/21/2021   Procedure: COLONOSCOPY WITH PROPOFOL;  Surgeon: Jonathon Bellows, MD;  Location: [facility] ENDOSCOPY;  Service: Gastroenterology;  Laterality: N/A;   ORIF FOOT FRACTURE     VENTRICULOPERITONEAL SHUNT Right 05/31/2020   Procedure: SHUNT INSERTION VENTRICULAR-PERITONEAL;  Surgeon: Meade Maw, MD;  Location: [facility]  ORS;  Service: Neurosurgery;  Laterality: Right;  Dr Lysle Pearl to assist    Current Medications: Current Meds  Medication Sig   acetaminophen (TYLENOL) 500 MG tablet Take 1,000 mg by mouth every 6 (six) hours as needed for moderate pain or headache.   aspirin EC 81 MG tablet Take 1 tablet (81 mg total) by mouth daily. Swallow whole.   Cyanocobalamin (B-12) 2500 MCG TABS Take 2,500 mcg by mouth daily.   methimazole  (TAPAZOLE) 5 MG tablet Take 0.5 tablets (2.5 mg total) by mouth at bedtime.   Multiple Vitamins-Minerals (PRESERVISION AREDS 2) CAPS Take 1 capsule by mouth 2 (two) times daily.   Polyvinyl Alcohol-Povidone (REFRESH OP) Place 1 drop into both eyes 2 (two) times daily as needed (dry eye).   [DISCONTINUED] atorvastatin (LIPITOR) 20 MG tablet Take 1 tablet (20 mg total) by mouth daily.     Allergies:   Bee venom, Latex, Penicillins, and Adhesive [tape]   Social History   Socioeconomic History   Marital status: Single    Spouse name: Not on file   Number of children: Not on file   Years of education: Not on file   Highest education level: Not on file  Occupational History   Not on file  Tobacco Use   Smoking status: Some Days    Packs/day: 0.50    Years: 35.00    Total pack years: 17.50    Types: Cigarettes   Smokeless tobacco: Never   Tobacco comments:    "occasional" cigarette  Vaping Use   Vaping Use: Never used  Substance and Sexual Activity   Alcohol use: Yes    Alcohol/week: 3.0 standard drinks of alcohol    Types: 3 Glasses of wine per week    Comment: OCC   Drug use: Never   Sexual activity: Not Currently  Other Topics Concern   Not on file  Social History Narrative   Not on file   Social Determinants of Health   Financial Resource Strain: Low Risk  (11/16/2018)   Overall Financial Resource Strain (CARDIA)    Difficulty of Paying Living Expenses: Not hard at all  Food Insecurity: No Food Insecurity (11/16/2018)   Hunger Vital Sign    Worried About Running Out of Food in the Last Year: Never true    Ran Out of Food in the Last Year: Never true  Transportation Needs: No Transportation Needs (11/16/2018)   PRAPARE - Hydrologist (Medical): No    Lack of Transportation (Non-Medical): No  Physical Activity: Insufficiently Active (11/16/2018)   Exercise Vital Sign    Days of Exercise per Week: 7 days    Minutes of Exercise per Session: 20  min  Stress: No Stress Concern Present (11/16/2018)   [place]    Feeling of Stress : Not at all  Social Connections: Not on file     Family History: The patient's family history includes Early death in her brother; Heart attack in her father; Thyroid disease in her brother.  ROS:   Please see the history of present illness.     All other systems reviewed and are negative.  EKGs/Labs/Other Studies Reviewed:    The following studies were reviewed today:  EKG:  EKG is ordered today.  EKG shows normal sinus rhythm  Recent Labs: 12/25/2020: TSH 1.54 08/07/2021: ALT 19; BUN 18; Creatinine, Ser 1.22; Hemoglobin 14.9; Platelets 294.0; Potassium 4.5; Sodium 139  Recent Lipid Panel  Component Value Date/Time   CHOL 159 11/15/2020 0941   TRIG 80 11/15/2020 0941   HDL 72 11/15/2020 0941   CHOLHDL 2.2 11/15/2020 0941   VLDL 16 11/15/2020 0941   LDLCALC 71 11/15/2020 0941     Risk Assessment/Calculations:      Physical Exam:    VS:  BP 118/80 (BP Location: Right Arm, Patient Position: Sitting, Cuff Size: Normal)   Pulse 79   Ht 5' 5.5" (1.664 m)   Wt 133 lb (60.3 kg)   SpO2 97%   BMI 21.80 kg/m     Wt Readings from Last 3 Encounters:  11/13/21 133 lb (60.3 kg)  09/21/21 132 lb (59.9 kg)  06/25/21 131 lb (59.4 kg)     GEN:  Well nourished, well developed in no acute distress HEENT: Normal NECK: No JVD; No carotid bruits CARDIAC: RRR, no murmurs, rubs, gallops RESPIRATORY:  Clear to auscultation without rales, wheezing or rhonchi  ABDOMEN: Soft, non-tender, non-distended MUSCULOSKELETAL:  No edema; No deformity  SKIN: Warm and dry NEUROLOGIC:  Alert and oriented x 3 PSYCHIATRIC:  Normal affect   ASSESSMENT:    1. Coronary artery disease involving native coronary artery of native heart without angina pectoris   2. Nonrheumatic tricuspid valve regurgitation   3. Ejection fraction < 50%   4.  Palpitations    PLAN:    In order of problems listed above:  Nonobstructive CAD, mild disease in proximal LAD and mid RCA.  Calcium score 315.  Denies chest pain.  Continue aspirin 81 mg, Lipitor 20 mg daily. Mild to moderate tricuspid regurgitation, continue monitoring TR with frequent echoes.  Mildly reduced EF.  Repeat echo in 2 months Mildly reduced ejection fraction, EF 45%.  Euvolemic, asymptomatic.  Avoiding GDMT due to low normal BPs, NPH with VP shunt, dizziness . Palpitations associated with stress.  Stress relieving factors advised.  Not consistent with cardiac etiology.  Follow-up in 4 months   Medication Adjustments/Labs and Tests Ordered: Current medicines are reviewed at length with the patient today.  Concerns regarding medicines are outlined above.  Orders Placed This Encounter  Procedures   EKG 12-Lead   ECHOCARDIOGRAM COMPLETE    Meds ordered this encounter  Medications   atorvastatin (LIPITOR) 20 MG tablet    Sig: Take 1 tablet (20 mg total) by mouth daily.    Dispense:  90 tablet    Refill:  3       Signed, Kate Sable, MD  11/13/2021 10:58 AM    [facility]

## 2021-11-13 NOTE — Patient Instructions (Signed)
Medication Instructions:   Your physician recommends that you continue on your current medications as directed. Please refer to the Current Medication list given to you today.   *If you need a refill on your cardiac medications before your next appointment, please call your pharmacy*   Lab Work: None ordered  If you have labs (blood work) drawn today and your tests are completely normal, you will receive your results only by: [place] (if you have MyChart) OR A paper copy in the mail If you have any lab test that is abnormal or we need to change your treatment, we will call you to review the results.   Testing/Procedures:  Your physician has requested that you have an echocardiogram in 2 months. Echocardiography is a painless test that uses sound waves to create images of your heart. It provides your doctor with information about the size and shape of your heart and how well your heart's chambers and valves are working. This procedure takes approximately one hour. There are no restrictions for this procedure.    Follow-Up: At [facility], you and your health needs are our priority.  As part of our continuing mission to provide you with exceptional heart care, we have created designated Provider Care Teams.  These Care Teams include your primary Cardiologist (physician) and Advanced Practice Providers (APPs -  Physician Assistants and Nurse Practitioners) who all work together to provide you with the care you need, when you need it.  We recommend signing up for the patient portal called "MyChart".  Sign up information is provided on this After Visit Summary.  MyChart is used to connect with patients for Virtual Visits (Telemedicine).  Patients are able to view lab/test results, encounter notes, upcoming appointments, etc.  Non-urgent messages can be sent to your provider as well.   To learn more about what you can do with MyChart, go to [email or web address].    Your next  appointment:   3-4 month(s)  The format for your next appointment:   In Person  Provider:   You may see Kate Sable, MD or one of the following Advanced Practice Providers on your designated Care Team:   Murray Hodgkins, NP Christell Faith, PA-C Cadence Kathlen Mody, [place]   Other Instructions N/A  Important Information About Sugar

## 2021-11-14 ENCOUNTER — Telehealth: Payer: Self-pay | Admitting: Internal Medicine

## 2021-11-14 NOTE — Telephone Encounter (Signed)
Copied from [place] [phone]. Topic: Medicare AWV >> Nov 14, 2021 10:40 AM Devoria Glassing wrote: Reason for CRM: Left message for patient to schedule Annual Wellness Visit.  Please schedule with Nurse Health Advisor Denisa O'Brien-Blaney, LPN at [facility]. This appt can be telephone or office visit.  Please call [phone] ask for [facility]

## 2021-11-15 ENCOUNTER — Ambulatory Visit: Payer: Medicare HMO | Admitting: Podiatry

## 2021-11-20 ENCOUNTER — Ambulatory Visit: Payer: Medicare HMO | Admitting: Podiatry

## 2021-11-20 DIAGNOSIS — M7751 Other enthesopathy of right foot: Secondary | ICD-10-CM

## 2021-11-20 NOTE — Progress Notes (Signed)
Subjective:  Patient ID: Martha Wilson, female    DOB: 06-29-1950,  MRN: [id number]  Chief Complaint  Patient presents with   Nail Problem    71 y.o. female presents with the above complaint.  Patient presents with complaint of right hallux IPJ capsulitis.  Patient is afebrile touch painful to walk on has progressed gotten worse pain scale 7 out of 10 hurts with ambulation.  She has not seen anyone as prior to seeing me.  Hurts with pressure.  She has tried making some shoe gear modification which has helped.  She would like to discuss injection for this.   Review of Systems: Negative except as noted in the HPI. Denies N/V/F/Ch.  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 Swallow whole., Disp: 30 tablet, Rfl: 5   atorvastatin (LIPITOR) 20 MG tablet, Take 1 tablet (20 mg total) by mouth daily., Disp: 90 tablet, Rfl: 3   Cyanocobalamin (B-12) 2500 MCG TABS, Take 2,500 mcg by mouth daily., Disp: , Rfl:    methimazole (TAPAZOLE) 5 MG tablet, Take 0.5 tablets (2.5 mg total) by mouth at bedtime., Disp: 90 tablet, Rfl: 1   Multiple Vitamins-Minerals (PRESERVISION AREDS 2) CAPS, Take 1 capsule by mouth 2 (two) times daily., Disp: , Rfl:    Polyvinyl Alcohol-Povidone (REFRESH OP), Place 1 drop into both eyes 2  (two) times daily as needed (dry eye)., Disp: , Rfl:   Social History   Tobacco Use  Smoking Status Some Days   Packs/day: 0.50   Years: 35.00   Total pack years: 17.50   Types: Cigarettes  Smokeless Tobacco Never  Tobacco Comments   "occasional" cigarette    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 There is no height or weight on file to calculate BMI. Constitutional Well developed. Well nourished.  Vascular Dorsalis pedis pulses palpable bilaterally. Posterior tibial pulses palpable bilaterally. Capillary refill normal to all digits.  No cyanosis or clubbing noted. Pedal hair growth normal.  Neurologic Normal speech. Oriented to person, place, and time. Epicritic sensation to light touch grossly present bilaterally.  Dermatologic Nails well groomed and normal in appearance. No open wounds. No skin lesions.  Orthopedic: Pain on palpation of right hallux IPJ capsulitis.  Pain with range of motion of the joint.  No deep intra-articular pain noted.  It is still mobile.  She denies any pain in the metatarsophalangeal joint.  No pain with range  of motion of the MPJ joint   Radiographs: None Assessment:   1. Capsulitis of toe, right    Plan:  Patient was evaluated and treated and all questions answered.  Right hallux IPJ capsulitis -I explained the patient the etiology of capsulitis and worse treatment options were discussed.  Given the amount of pain that she is having she will benefit from a steroid injection. -A steroid injection was performed at right hallux IPJ using 1% plain Lidocaine and 10 mg of Kenalog. This was well tolerated. -I discussed shoe gear modification in extensive detail as well as importance of orthotics.  She states  understanding.   No follow-ups on file.

## 2021-12-19 ENCOUNTER — Telehealth: Payer: Self-pay | Admitting: Internal Medicine

## 2021-12-19 NOTE — Telephone Encounter (Signed)
Copied from [place] [phone]. Topic: Medicare AWV >> Dec 19, 2021 11:01 AM Devoria Glassing wrote: Reason for CRM: Left message for patient to schedule Annual Wellness Visit.  Please schedule with Nurse Health Advisor Denisa O'Brien-Blaney, LPN at [facility]. This appt can be telephone or office visit.  Please call [phone] ask for [facility]

## 2022-01-01 ENCOUNTER — Other Ambulatory Visit: Payer: Self-pay | Admitting: Internal Medicine

## 2022-01-01 DIAGNOSIS — Z1231 Encounter for screening mammogram for malignant neoplasm of breast: Secondary | ICD-10-CM

## 2022-01-07 ENCOUNTER — Telehealth: Payer: Self-pay

## 2022-01-07 NOTE — Telephone Encounter (Signed)
-----   Message from Peggyann Shoals sent at 01/07/2022  4:13 PM EDT ----- Regarding: yrly shunt fu Contact: [phone] Patient has requested a yearly fu for shunt recheck in December. Is there any test she will need prior to that appt on 03/21/2022?

## 2022-01-08 NOTE — Telephone Encounter (Signed)
Left message for her to call back

## 2022-01-08 NOTE — Telephone Encounter (Signed)
No issues just requesting a yearly shunt checkup.

## 2022-01-16 ENCOUNTER — Other Ambulatory Visit: Payer: Medicare HMO

## 2022-01-22 ENCOUNTER — Ambulatory Visit: Payer: Medicare HMO | Admitting: Podiatry

## 2022-01-22 ENCOUNTER — Encounter: Payer: Self-pay | Admitting: Podiatry

## 2022-01-22 DIAGNOSIS — M7751 Other enthesopathy of right foot: Secondary | ICD-10-CM

## 2022-01-22 NOTE — Progress Notes (Signed)
Subjective:  Patient ID: Martha Wilson, female    DOB: 01-05-1951,  MRN: [id number]  Chief Complaint  Patient presents with   Nail Problem    Nail trim  Pt stated she would also like an injection in her toe     71 y.o. female presents with the above complaint.  Patient presents for follow-up of right hallux IPJ capsulitis.  She states that the injection helped considerably.  She would like to do another injection.  She denies any other acute complaints.   Review of Systems: Negative except as noted in the HPI. Denies N/V/F/Ch.  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 Swallow whole., Disp: 30 tablet, Rfl: 5   atorvastatin (LIPITOR) 20 MG tablet, Take 1 tablet (20 mg total) by mouth daily., Disp: 90 tablet, Rfl: 3   Cyanocobalamin (B-12) 2500 MCG TABS, Take 2,500 mcg by mouth daily., Disp: , Rfl:    methimazole (TAPAZOLE) 5 MG tablet, Take 0.5 tablets (2.5 mg total) by mouth at bedtime., Disp: 90 tablet, Rfl: 1   Multiple Vitamins-Minerals (PRESERVISION AREDS 2) CAPS, Take 1 capsule by mouth 2 (two) times daily., Disp: , Rfl:    Polyvinyl Alcohol-Povidone (REFRESH OP), Place 1 drop into both eyes 2 (two) times daily as needed (dry eye)., Disp: , Rfl:   Social History   Tobacco Use  Smoking  Status Some Days   Packs/day: 0.50   Years: 35.00   Total pack years: 17.50   Types: Cigarettes  Smokeless Tobacco Never  Tobacco Comments   "occasional" cigarette    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 There is no height or weight on file to calculate BMI. Constitutional Well developed. Well nourished.  Vascular Dorsalis pedis pulses palpable bilaterally. Posterior tibial pulses palpable bilaterally. Capillary refill normal to all digits.  No cyanosis or clubbing noted. Pedal hair growth normal.  Neurologic Normal speech. Oriented to person, place, and time. Epicritic sensation to light touch grossly present bilaterally.  Dermatologic Nails well groomed and normal in appearance. No open wounds. No skin lesions.  Orthopedic: Pain on palpation of right hallux IPJ capsulitis.  Pain with range of motion of the joint.  No deep intra-articular pain noted.  It is still mobile.  She denies any pain in the metatarsophalangeal joint.  No pain with range of motion of the MPJ joint   Radiographs: None Assessment:   No diagnosis found.  Plan:  Patient was evaluated and treated and all questions answered.  Right hallux IPJ capsulitis -I explained the patient the etiology of capsulitis and worse treatment options were discussed.  Given the amount of pain that she is having she will benefit from a steroid injection. -A s another teroid injection was performed at right hallux IPJ using 1% plain Lidocaine and 10 mg of Kenalog. This was well tolerated. -I discussed shoe gear modification in extensive detail as well as importance of orthotics.  She states understanding.   No follow-ups on file.

## 2022-01-23 ENCOUNTER — Ambulatory Visit
Admission: RE | Admit: 2022-01-23 | Discharge: 2022-01-23 | Disposition: A | Discharge status: Home / Self Care | Payer: Medicare HMO | Source: Ambulatory Visit | Attending: Internal Medicine | Admitting: Internal Medicine

## 2022-01-23 DIAGNOSIS — Z1231 Encounter for screening mammogram for malignant neoplasm of breast: Secondary | ICD-10-CM | POA: Diagnosis present

## 2022-02-04 ENCOUNTER — Telehealth: Payer: Self-pay | Admitting: Internal Medicine

## 2022-02-04 NOTE — Telephone Encounter (Unsigned)
Copied from [place] [id number]. Topic: Medicare AWV >> Feb 04, 2022  1:26 PM Devoria Glassing wrote: Reason for CRM: Left message for patient to schedule Annual Wellness Visit.  Please schedule with Nurse Health Advisor Denisa O'Brien-Blaney, LPN at [facility]. This appt can be telephone or office visit.  Please call [phone] ask for [facility]

## 2022-02-06 ENCOUNTER — Ambulatory Visit: Payer: Medicare HMO | Attending: Cardiology

## 2022-02-06 DIAGNOSIS — R9430 Abnormal result of cardiovascular function study, unspecified: Secondary | ICD-10-CM | POA: Diagnosis not present

## 2022-02-06 DIAGNOSIS — I361 Nonrheumatic tricuspid (valve) insufficiency: Secondary | ICD-10-CM | POA: Diagnosis not present

## 2022-02-06 LAB — ECHOCARDIOGRAM COMPLETE
AR max vel: 2.22 cm2
AV Area VTI: 2.45 cm2
AV Area mean vel: 2.39 cm2
AV Mean grad: 2 mmHg
AV Peak grad: 3.1 mmHg
Ao pk vel: 0.88 m/s
Area-P 1/2: 2.7 cm2
Calc EF: 61.4 %
S' Lateral: 2.4 cm
Single Plane A2C EF: 53.6 %
Single Plane A4C EF: 65.6 %

## 2022-02-15 ENCOUNTER — Telehealth: Payer: Self-pay | Admitting: Cardiology

## 2022-02-15 NOTE — Telephone Encounter (Signed)
Spoke w/ pt.  She reports that she had a wellness visit w/ [employer] for her ins and she is questioning why she has the diagnoses of afib and cardiomyopathy. Advised her that cardiomyopathy was entered as a dx by Dr. Derrel Nip in March 2023 after her echo, but I do not see afib listed as a dx in her chart. She is appreciative and will call her ins to update.  Asked her to call back if we can be of further assistance.

## 2022-02-15 NOTE — Telephone Encounter (Signed)
Patient called in to say that she was told that she had Afib but that was never told to her bout the dr. Calling with question/concerns . Please advise

## 2022-02-18 ENCOUNTER — Ambulatory Visit
Admission: RE | Admit: 2022-02-18 | Discharge: 2022-02-18 | Disposition: A | Discharge status: Home / Self Care | Payer: Medicare HMO | Source: Ambulatory Visit | Attending: Internal Medicine | Admitting: Internal Medicine

## 2022-02-18 DIAGNOSIS — Z780 Asymptomatic menopausal state: Secondary | ICD-10-CM | POA: Insufficient documentation

## 2022-02-19 ENCOUNTER — Encounter: Payer: Self-pay | Admitting: Internal Medicine

## 2022-02-19 ENCOUNTER — Ambulatory Visit (INDEPENDENT_AMBULATORY_CARE_PROVIDER_SITE_OTHER): Payer: Medicare HMO

## 2022-02-19 VITALS — Ht 65.5 in | Wt 133.0 lb

## 2022-02-19 DIAGNOSIS — Z0000 Encounter for general adult medical examination without abnormal findings: Secondary | ICD-10-CM

## 2022-02-19 DIAGNOSIS — M810 Age-related osteoporosis without current pathological fracture: Secondary | ICD-10-CM | POA: Insufficient documentation

## 2022-02-19 NOTE — Progress Notes (Addendum)
Subjective:   Martha Wilson is a 71 y.o. female who presents for Medicare Annual (Subsequent) preventive examination.  Review of Systems    No ROS.  Medicare Wellness Virtual Visit.  Visual/audio telehealth visit, UTA vital signs.   See social history for additional risk factors.   Cardiac Risk Factors include: advanced age (>24mn, >>33women)     Objective:    Today's Vitals   02/19/22 1301  Weight: 133 lb (60.3 kg)  Height: 5' 5.5" (1.664 m)   Body mass index is 21.8 kg/m.     02/19/2022    1:13 PM 09/21/2021    7:26 AM 05/31/2020   11:08 AM 05/19/2020   11:23 AM 11/19/2018    8:05 AM 11/16/2018   10:14 AM 10/06/2018   12:17 PM  Advanced Directives  Does Patient Have a Medical Advance Directive? Yes Yes No No No No Yes  Type of AParamedicof AGeigerLiving will HTsaileLiving will     HU[place]Living will  Does patient want to make changes to medical advance directive? No - Patient declined      No - Patient declined  Copy of HHectorin Chart? No - copy requested No - copy requested     Yes - validated most recent copy scanned in chart (See row information)  Would patient like information on creating a medical advance directive?   No - Patient declined  No - Patient declined No - Patient declined     Current Medications (verified) Outpatient Encounter Medications as of 02/19/2022  Medication Sig   acetaminophen (TYLENOL) 500 MG tablet Take 1,000 mg by mouth every 6 (six) hours as needed for moderate pain or headache.   aspirin EC 81 MG tablet Take 1 tablet (81 mg total) by mouth daily. Swallow whole.   atorvastatin (LIPITOR) 20 MG tablet Take 1 tablet (20 mg total) by mouth daily.   Cyanocobalamin (B-12) 2500 MCG TABS Take 2,500 mcg by mouth daily.   methimazole (TAPAZOLE) 5 MG tablet Take 0.5 tablets (2.5 mg total) by mouth at bedtime.   Multiple Vitamins-Minerals  (PRESERVISION AREDS 2) CAPS Take 1 capsule by mouth 2 (two) times daily.   Polyvinyl Alcohol-Povidone (REFRESH OP) Place 1 drop into both eyes 2 (two) times daily as needed (dry eye).   No facility-administered encounter medications on file as of 02/19/2022.    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   by CElayne Guerinfor NPH   Tricuspid valve prolapse    Wears dentures    partial upper and lower   Past Surgical History:  Procedure Laterality Date   APPLICATION OF CRANIAL NAVIGATION N/A 05/31/2020   Procedure: APPLICATION OF CRANIAL NAVIGATION;  Surgeon: YMeade Maw MD;  Location: [facility] ORS;  Service: Neurosurgery;  Laterality: N/A;   BREAST BIOPSY  1980's   pt states she had a bx in the 80's, not sure of side, benign   breat biopsy     CATARACT  EXTRACTION W/PHACO Right 09/22/2018   Procedure: CATARACT EXTRACTION PHACO AND INTRAOCULAR LENS PLACEMENT (IOC)  RIGHT panooptix;  Surgeon: Birder Robson, MD;  Location: [place];  Service: Ophthalmology;  Laterality: Right;   CATARACT EXTRACTION W/PHACO Left 10/06/2018   Procedure: CATARACT EXTRACTION PHACO AND INTRAOCULAR LENS PLACEMENT ([place]) LEFT PANOPTIX LENS;  Surgeon: Birder Robson, MD;  Location: [place];  Service: Ophthalmology;  Laterality: Left;   COLONOSCOPY     COLONOSCOPY WITH PROPOFOL N/A 11/19/2018   Procedure: COLONOSCOPY WITH PROPOFOL;  Surgeon: Jonathon Bellows, MD;  Location: [facility] ENDOSCOPY;  Service: Gastroenterology;  Laterality: N/A;   COLONOSCOPY WITH PROPOFOL N/A  09/21/2021   Procedure: COLONOSCOPY WITH PROPOFOL;  Surgeon: Jonathon Bellows, MD;  Location: [facility] ENDOSCOPY;  Service: Gastroenterology;  Laterality: N/A;   ORIF FOOT FRACTURE     VENTRICULOPERITONEAL SHUNT Right 05/31/2020   Procedure: SHUNT INSERTION VENTRICULAR-PERITONEAL;  Surgeon: Meade Maw, MD;  Location: [facility];  Service: Neurosurgery;  Laterality: Right;  Dr Lysle Pearl to assist   Family History  Problem Relation Age of Onset   Heart attack Father    Thyroid disease Brother    Early death Brother    Social History   Socioeconomic History   Marital status: Single    Spouse name: Not on file   Number of children: Not on file   Years of education: Not on file   Highest education level: Not on file  Occupational History   Not on file  Tobacco Use   Smoking status: Some Days    Packs/day: 0.50    Years: 35.00    Total pack years: 17.50    Types: Cigarettes   Smokeless tobacco: Never   Tobacco comments:    "occasional" cigarette  Vaping Use   Vaping Use: Never used  Substance and Sexual Activity   Alcohol use: Yes    Alcohol/week: 3.0 standard drinks of alcohol    Types: 3 Glasses of wine per week    Comment: OCC   Drug use: Never   Sexual activity: Not Currently  Other Topics Concern   Not on file  Social History Narrative   Not on file   Social Determinants of Health   Financial Resource Strain: Low Risk  (02/19/2022)   Overall Financial Resource Strain (CARDIA)    Difficulty of Paying Living Expenses: Not hard at all  Food Insecurity: No Food Insecurity (02/19/2022)   Hunger Vital Sign    Worried About Running Out of Food in the Last Year: Never true    Ran Out of Food in the Last Year: Never true  Transportation Needs: No Transportation Needs (02/19/2022)   PRAPARE - Hydrologist (Medical): No    Lack of Transportation (Non-Medical): No  Physical Activity: Insufficiently Active (02/19/2022)   Exercise Vital Sign    Days of  Exercise per Week: 7 days    Minutes of Exercise per Session: 20 min  Stress: No Stress Concern Present (02/19/2022)   [place]    Feeling of Stress : Not at all  Social Connections: Unknown (02/19/2022)   Social Connection and Isolation Panel [NHANES]    Frequency of Communication with Friends and Family: More than three times a week    Frequency of Social Gatherings with Friends and Family: More than three times a week    Attends Religious Services: Not on file    Active Member of Clubs or Organizations: Not on file  Attends Archivist Meetings: Not on file    Marital Status: Not on file    Tobacco Counseling Ready to quit: Not Answered Counseling given: Not Answered Tobacco comments: "occasional" cigarette   Clinical Intake:  Pre-visit preparation completed: Yes        Diabetes: No  How often do you need to have someone help you when you read instructions, pamphlets, or other written materials from your doctor or pharmacy?: 1 - Never    Interpreter Needed?: No      Activities of Daily Living    02/19/2022    1:03 PM  In your present state of health, do you have any difficulty performing the following activities:  Hearing? 0  Vision? 0  Difficulty concentrating or making decisions? 0  Walking or climbing stairs? 0  Dressing or bathing? 0  Doing errands, shopping? 0  Preparing Food and eating ? N  Using the Toilet? N  In the past six months, have you accidently leaked urine? N  Do you have problems with loss of bowel control? N  Managing your Medications? N  Managing your Finances? N  Housekeeping or managing your Housekeeping? N    Patient Care Team: [name], MD as PCP - General (Internal Medicine) Kate Sable, MD as Consulting Physician (Cardiology)  Indicate any recent Medical Services you may have received from other than [facility] providers in the past year  (date may be approximate).     Assessment:   This is a routine wellness examination for Martha Wilson.  I connected with  Martha Wilson on 02/19/22 by a audio enabled telemedicine application and verified that I am speaking with the correct person using two identifiers.  Patient Location: Home  Provider Location: Office/Clinic  I discussed the limitations of evaluation and management by telemedicine. The patient expressed understanding and agreed to proceed.   Hearing/Vision screen Hearing Screening - Comments:: Patient is able to hear conversational tones without difficulty. No issues reported. Vision Screening - Comments:: Followed by [facility] Cataract extraction, bilateral  They have seen their ophthalmologist in the last 12 months.  Dietary issues and exercise activities discussed: Current Exercise Habits: Home exercise routine, Type of exercise: stretching;walking;yoga, Time (Minutes): 20, Frequency (Times/Week): 7, Weekly Exercise (Minutes/Week): 140, Intensity: Mild   Goals Addressed               This Visit's Progress     Patient Stated     DIET - EAT MORE FRUITS AND VEGETABLES (pt-stated)   On track      Depression Screen    02/19/2022    1:08 PM 06/25/2021    2:01 PM 12/25/2020   12:57 PM 12/25/2020   11:38 AM 11/30/2019    2:27 PM 11/16/2018   10:15 AM 10/28/2018    3:37 PM  PHQ 2/9 Scores  PHQ - 2 Score 0 0 2 2 0 1 1  PHQ- 9 Score   '14   4 6    '$ Fall Risk    02/19/2022    1:07 PM 06/25/2021    2:00 PM 12/25/2020   11:35 AM 04/26/2020    1:59 PM 11/30/2019    2:27 PM  Fall Risk   Falls in the past year?  '1 1 1 1  '$ Number falls in past yr: 0 0 '1 1 1  '$ Injury with Fall? 0 0 0 0 0  Risk for fall due to :  History of fall(s)     Follow up Falls evaluation  completed;Falls prevention discussed Falls evaluation completed Falls evaluation completed Falls evaluation completed Falls evaluation completed    FALL RISK PREVENTION PERTAINING TO THE  HOME: Home free of loose throw rugs in walkways, pet beds, electrical cords, etc? Yes  Adequate lighting in your home to reduce risk of falls? Yes   ASSISTIVE DEVICES UTILIZED TO PREVENT FALLS: Life alert? No  Use of a cane, walker or w/c? No  Grab bars in the bathroom? Yes  Shower chair or bench in shower? Yes  Elevated toilet seat or a handicapped toilet? Yes   TIMED UP AND GO: Was the test performed? No .   Cognitive Function:        02/19/2022    1:24 PM 11/16/2018   10:20 AM  6CIT Screen  What Year? 0 points 0 points  What month? 0 points 0 points  What time? 0 points 0 points  Count back from 20  0 points  Months in reverse 0 points 0 points    Immunizations Immunization History  Administered Date(s) Administered   Fluad Quad(high Dose 65+) 02/15/2019, 12/21/2019, 12/25/2020   Moderna Sars-Covid-2 Vaccination 04/22/2019, 05/13/2019, 02/01/2020   PNEUMOCOCCAL CONJUGATE-20 12/25/2020   Flu Vaccine status: Due, Education has been provided regarding the importance of this vaccine. Advised may receive this vaccine at local pharmacy or Health Dept. Aware to provide a copy of the vaccination record if obtained from local pharmacy or Health Dept. Verbalized acceptance and understanding.  Covid-19 vaccine status: Completed vaccines  Shingrix Completed?: No.    Education has been provided regarding the importance of this vaccine. Patient has been advised to call insurance company to determine out of pocket expense if they have not yet received this vaccine. Advised may also receive vaccine at local pharmacy or Health Dept. Verbalized acceptance and understanding.  Screening Tests Health Maintenance  Topic Date Due   COVID-19 Vaccine (4 - Moderna series) 03/07/2022 (Originally 03/28/2020)   Hepatitis C Screening  04/08/2022 (Originally 01/09/1969)   Zoster Vaccines- Shingrix (1 of 2) 05/22/2022 (Originally 01/09/1999)   INFLUENZA VACCINE  07/07/2022 (Originally 11/06/2021)    TETANUS/TDAP  12/08/2022 (Originally 01/09/1970)   MAMMOGRAM  01/24/2023   Medicare Annual Wellness (AWV)  02/20/2023   COLONOSCOPY (Pts 45-64yr Insurance coverage will need to be confirmed)  09/21/2024   Pneumonia Vaccine 71 Years old  Completed   DEXA SCAN  Completed   HPV VACCINES  Aged Out    Health Maintenance There are no preventive care reminders to display for this patient.  CT Chest WO Contrast- completed 07/10/21.  Hepatitis C Screening: deferred per patient.   Vision Screening: Recommended annual ophthalmology exams for early detection of glaucoma and other disorders of the eye.  Dental Screening: Recommended annual dental exams for proper oral hygiene.  Community Resource Referral / Chronic Care Management: CRR required this visit?  No   CCM required this visit?  No      Plan:     I have personally reviewed and noted the following in the patient's chart:   Medical and social history Use of alcohol, tobacco or illicit drugs  Current medications and supplements including opioid prescriptions. Patient is not currently taking opioid prescriptions. Functional ability and status Nutritional status Physical activity Advanced directives List of other physicians Hospitalizations, surgeries, and ER visits in previous 12 months Vitals Screenings to include cognitive, depression, and falls Referrals and appointments  In addition, I have reviewed and discussed with patient certain preventive protocols, quality metrics, and best practice recommendations.  A written personalized care plan for preventive services as well as general preventive health recommendations were provided to patient.     [place], LPN   89/38/1017     I have reviewed the above information and agree with above.   Deborra Medina, MD

## 2022-02-19 NOTE — Patient Instructions (Addendum)
[name] , Thank you for taking time to come for your Medicare Wellness Visit. I appreciate your ongoing commitment to your health goals. Please review the following plan we discussed and let me know if I can assist you in the future.   These are the goals we discussed:  Goals       Patient Stated     DIET - EAT MORE FRUITS AND VEGETABLES (pt-stated)        This is a list of the screening recommended for you and due dates:  Health Maintenance  Topic Date Due   COVID-19 Vaccine (4 - Moderna series) 03/07/2022*   Hepatitis C Screening: USPSTF Recommendation to screen - Ages 18-79 yo.  04/08/2022*   Zoster (Shingles) Vaccine (1 of 2) 05/22/2022*   Flu Shot  07/07/2022*   Tetanus Vaccine  12/08/2022*   Mammogram  01/24/2023   Medicare Annual Wellness Visit  02/20/2023   Colon Cancer Screening  09/21/2024   Pneumonia Vaccine  Completed   DEXA scan (bone density measurement)  Completed   HPV Vaccine  Aged Out  *Topic was postponed. The date shown is not the original due date.    Advanced directives: End of life planning; Advance aging; Advanced directives discussed.  Copy of current HCPOA/Living Will requested.    Next appointment: Follow up in one year for your annual wellness visit    Preventive Care 65 Years and Older, Female Preventive care refers to lifestyle choices and visits with your health care provider that can promote health and wellness. What does preventive care include? A yearly physical exam. This is also called an annual well check. Dental exams once or twice a year. Routine eye exams. Ask your health care provider how often you should have your eyes checked. Personal lifestyle choices, including: Daily care of your teeth and gums. Regular physical activity. Eating a healthy diet. Avoiding tobacco and drug use. Limiting alcohol use. Practicing safe sex. Taking low-dose aspirin every day. Taking vitamin and mineral supplements as recommended by your  health care provider. What happens during an annual well check? The services and screenings done by your health care provider during your annual well check will depend on your age, overall health, lifestyle risk factors, and family history of disease. Counseling  Your health care provider may ask you questions about your: Alcohol use. Tobacco use. Drug use. Emotional well-being. Home and relationship well-being. Sexual activity. Eating habits. History of falls. Memory and ability to understand (cognition). Work and work Statistician. Reproductive health. Screening  You may have the following tests or measurements: Height, weight, and BMI. Blood pressure. Lipid and cholesterol levels. These may be checked every 5 years, or more frequently if you are over 37 years old. Skin check. Lung cancer screening. You may have this screening every year starting at age 65 if you have a 30-pack-year history of smoking and currently smoke or have quit within the past 15 years. Fecal occult blood test (FOBT) of the stool. You may have this test every year starting at age 95. Flexible sigmoidoscopy or colonoscopy. You may have a sigmoidoscopy every 5 years or a colonoscopy every 10 years starting at age 42. Hepatitis C blood test. Hepatitis B blood test. Sexually transmitted disease (STD) testing. Diabetes screening. This is done by checking your blood sugar (glucose) after you have not eaten for a while (fasting). You may have this done every 1-3 years. Bone density scan. This is done to screen for osteoporosis. You may have this  done starting at age 52. Mammogram. This may be done every 1-2 years. Talk to your health care provider about how often you should have regular mammograms. Talk with your health care provider about your test results, treatment options, and if necessary, the need for more tests. Vaccines  Your health care provider may recommend certain vaccines, such as: Influenza vaccine.  This is recommended every year. Tetanus, diphtheria, and acellular pertussis (Tdap, Td) vaccine. You may need a Td booster every 10 years. Zoster vaccine. You may need this after age 12. Pneumococcal 13-valent conjugate (PCV13) vaccine. One dose is recommended after age 62. Pneumococcal polysaccharide (PPSV23) vaccine. One dose is recommended after age 72. Talk to your health care provider about which screenings and vaccines you need and how often you need them. This information is not intended to replace advice given to you by your health care provider. Make sure you discuss any questions you have with your health care provider. Document Released: 04/21/2015 Document Revised: 12/13/2015 Document Reviewed: 01/24/2015 Elsevier Interactive Patient Education  2017 [place] Prevention in the Home Falls can cause injuries. They can happen to people of all ages. There are many things you can do to make your home safe and to help prevent falls. What can I do on the outside of my home? Regularly fix the edges of walkways and driveways and fix any cracks. Remove anything that might make you trip as you walk through a door, such as a raised step or threshold. Trim any bushes or trees on the path to your home. Use bright outdoor lighting. Clear any walking paths of anything that might make someone trip, such as rocks or tools. Regularly check to see if handrails are loose or broken. Make sure that both sides of any steps have handrails. Any raised decks and porches should have guardrails on the edges. Have any leaves, snow, or ice cleared regularly. Use sand or salt on walking paths during winter. Clean up any spills in your garage right away. This includes oil or grease spills. What can I do in the bathroom? Use night lights. Install grab bars by the toilet and in the tub and shower. Do not use towel bars as grab bars. Use non-skid mats or decals in the tub or shower. If you need to sit  down in the shower, use a plastic, non-slip stool. Keep the floor dry. Clean up any water that spills on the floor as soon as it happens. Remove soap buildup in the tub or shower regularly. Attach bath mats securely with double-sided non-slip rug tape. Do not have throw rugs and other things on the floor that can make you trip. What can I do in the bedroom? Use night lights. Make sure that you have a light by your bed that is easy to reach. Do not use any sheets or blankets that are too big for your bed. They should not hang down onto the floor. Have a firm chair that has side arms. You can use this for support while you get dressed. Do not have throw rugs and other things on the floor that can make you trip. What can I do in the kitchen? Clean up any spills right away. Avoid walking on wet floors. Keep items that you use a lot in easy-to-reach places. If you need to reach something above you, use a strong step stool that has a grab bar. Keep electrical cords out of the way. Do not use floor polish or wax  that makes floors slippery. If you must use wax, use non-skid floor wax. Do not have throw rugs and other things on the floor that can make you trip. What can I do with my stairs? Do not leave any items on the stairs. Make sure that there are handrails on both sides of the stairs and use them. Fix handrails that are broken or loose. Make sure that handrails are as long as the stairways. Check any carpeting to make sure that it is firmly attached to the stairs. Fix any carpet that is loose or worn. Avoid having throw rugs at the top or bottom of the stairs. If you do have throw rugs, attach them to the floor with carpet tape. Make sure that you have a light switch at the top of the stairs and the bottom of the stairs. If you do not have them, ask someone to add them for you. What else can I do to help prevent falls? Wear shoes that: Do not have high heels. Have rubber bottoms. Are  comfortable and fit you well. Are closed at the toe. Do not wear sandals. If you use a stepladder: Make sure that it is fully opened. Do not climb a closed stepladder. Make sure that both sides of the stepladder are locked into place. Ask someone to hold it for you, if possible. Clearly mark and make sure that you can see: Any grab bars or handrails. First and last steps. Where the edge of each step is. Use tools that help you move around (mobility aids) if they are needed. These include: Canes. Walkers. Scooters. Crutches. Turn on the lights when you go into a dark area. Replace any light bulbs as soon as they burn out. Set up your furniture so you have a clear path. Avoid moving your furniture around. If any of your floors are uneven, fix them. If there are any pets around you, be aware of where they are. Review your medicines with your doctor. Some medicines can make you feel dizzy. This can increase your chance of falling. Ask your doctor what other things that you can do to help prevent falls. This information is not intended to replace advice given to you by your health care provider. Make sure you discuss any questions you have with your health care provider. Document Released: 01/19/2009 Document Revised: 08/31/2015 Document Reviewed: 04/29/2014 Elsevier Interactive Patient Education  2017 [employer].

## 2022-03-15 ENCOUNTER — Ambulatory Visit: Payer: Medicare HMO | Admitting: Cardiology

## 2022-03-18 ENCOUNTER — Encounter: Payer: Self-pay | Admitting: Internal Medicine

## 2022-03-18 ENCOUNTER — Ambulatory Visit (INDEPENDENT_AMBULATORY_CARE_PROVIDER_SITE_OTHER): Payer: Medicare HMO | Admitting: Internal Medicine

## 2022-03-18 VITALS — BP 118/80 | HR 58 | Temp 98.1°F | Ht 65.5 in | Wt 135.0 lb

## 2022-03-18 DIAGNOSIS — E785 Hyperlipidemia, unspecified: Secondary | ICD-10-CM

## 2022-03-18 DIAGNOSIS — R7303 Prediabetes: Secondary | ICD-10-CM | POA: Diagnosis not present

## 2022-03-18 DIAGNOSIS — Z23 Encounter for immunization: Secondary | ICD-10-CM | POA: Diagnosis not present

## 2022-03-18 DIAGNOSIS — E0590 Thyrotoxicosis, unspecified without thyrotoxic crisis or storm: Secondary | ICD-10-CM

## 2022-03-18 DIAGNOSIS — R894 Abnormal immunological findings in specimens from other organs, systems and tissues: Secondary | ICD-10-CM

## 2022-03-18 DIAGNOSIS — R238 Other skin changes: Secondary | ICD-10-CM | POA: Diagnosis not present

## 2022-03-18 DIAGNOSIS — D582 Other hemoglobinopathies: Secondary | ICD-10-CM

## 2022-03-18 DIAGNOSIS — Z720 Tobacco use: Secondary | ICD-10-CM

## 2022-03-18 DIAGNOSIS — R350 Frequency of micturition: Secondary | ICD-10-CM

## 2022-03-18 DIAGNOSIS — R2689 Other abnormalities of gait and mobility: Secondary | ICD-10-CM

## 2022-03-18 DIAGNOSIS — E042 Nontoxic multinodular goiter: Secondary | ICD-10-CM

## 2022-03-18 DIAGNOSIS — I361 Nonrheumatic tricuspid (valve) insufficiency: Secondary | ICD-10-CM

## 2022-03-18 DIAGNOSIS — R42 Dizziness and giddiness: Secondary | ICD-10-CM

## 2022-03-18 DIAGNOSIS — N3946 Mixed incontinence: Secondary | ICD-10-CM

## 2022-03-18 DIAGNOSIS — M816 Localized osteoporosis [Lequesne]: Secondary | ICD-10-CM

## 2022-03-18 NOTE — Patient Instructions (Addendum)
Consider the following options for osteoporosis treatment  ,Prolia (the shot every 6 months )  Boniva Fosamax Actonel   You need to wear shoes with a wider toe box or use moleskin to protect the skin of your big toe   Regarding the bowels and food concerns:  The book I referred to today when we were discussing your GI issues is Dr Claris Pong Bennett's book "AIP Diet for Beginners: A Comprehensive Guide to the Autoimmune Paleo protocol"

## 2022-03-18 NOTE — Progress Notes (Unsigned)
Subjective:  Patient ID: Martha Wilson, female    DOB: 1950-05-09  Age: 71 y.o. MRN: [id number]  CC: The primary encounter diagnosis was Prediabetes. Diagnoses of Hyperlipidemia, unspecified hyperlipidemia type, Urinary frequency, Vesicular rash, Need for immunization against influenza, Urge and stress incontinence, Tricuspid valve insufficiency, non-rheumatic, Balance disorder, Dizziness, Tobacco abuse, Localized osteoporosis without current pathological fracture, Elevated hemoglobin ([place]), Multiple thyroid nodules, and Hyperthyroidism were also pertinent to this visit.   HPI Martha Wilson presents for follow up on multiple issues  Chief Complaint  Patient presents with   Follow-up    Follow up    1) multivessel mild CAD  : reviewed her  last appt with cardiology in /August ;  she has been asymptomatic,  and taking a statin.  no changes were made to regimen. 2 D  ECHO was  done in Nov 2023 to rule out causes of dizziness and light headedness  and reviewed:  She has  low normal LV function,  mild TR, aortic sclerosis without stenosis .  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  She continues to have urinary incontinence issues   4) osteoporosis :  T score of  spine -3.0  Nov 2023, currenlty  taking 1800 mg calcium and 2000 IU's D3 .  Discussed treatment options with Evista c/I due to CAD.  She is considering Prolia given recent episode of choking on a MVI  5) Had  Diarrhea for one week in october after leaving [place]. Resolved now but having multiple stools daily  that are formed.   6) recurrent breakouts of vesicular rash.  Never on lips,  never on vaginal area, but occurring monthly on buttock near the natal cleft and on either left hip   wondering if she can get shingles vaccine . No prior testing for HSV.  Outpatient Medications Prior to Visit  Medication Sig Dispense Refill   acetaminophen (TYLENOL) 500 MG  tablet Take 1,000 mg by mouth every 6 (six) hours as needed for moderate pain or headache.     aspirin EC 81 MG tablet Take 1 tablet (81 mg total) by mouth daily. Swallow whole. 30 tablet 5   atorvastatin (LIPITOR) 20 MG tablet Take 1 tablet (20 mg total) by mouth daily. 90 tablet 3   Cyanocobalamin (B-12) 2500 MCG TABS Take 2,500 mcg by mouth daily.     methimazole (TAPAZOLE) 5 MG tablet Take 0.5 tablets (2.5 mg total) by mouth at bedtime. 90 tablet 1   Multiple Vitamins-Minerals (PRESERVISION AREDS 2) CAPS Take 1 capsule by mouth 2 (two) times daily.     Polyvinyl Alcohol-Povidone (REFRESH OP) Place 1 drop into both eyes 2 (two) times daily as needed (dry eye).     No facility-administered medications prior to visit.    Review of Systems;  Patient denies headache, fevers, malaise, unintentional weight loss, skin rash, eye pain, sinus congestion and sinus pain, sore throat, dysphagia,  hemoptysis , cough, dyspnea, wheezing, chest pain, palpitations, orthopnea, edema, abdominal pain, nausea, melena, diarrhea, constipation, flank pain, dysuria, hematuria, urinary  Frequency, nocturia, numbness, tingling, seizures,  Focal weakness, Loss of consciousness,  Tremor, insomnia, depression, anxiety, and suicidal ideation.      Objective:  BP 118/80   Pulse (!) 58   Temp 98.1 F (36.7 C) (Oral)   Ht 5' 5.5" (1.664 m)   Wt 135 lb (61.2 kg)   SpO2 97%  BMI 22.12 kg/m   BP Readings from Last 3 Encounters:  03/18/22 118/80  11/13/21 118/80  09/21/21 122/76    Wt Readings from Last 3 Encounters:  03/18/22 135 lb (61.2 kg)  02/19/22 133 lb (60.3 kg)  11/13/21 133 lb (60.3 kg)    General appearance: alert, cooperative and appears stated age Ears: normal TM's and external ear canals both ears Throat: lips, mucosa, and tongue normal; teeth and gums normal Neck: no adenopathy, no carotid bruit, supple, symmetrical, trachea midline and thyroid not enlarged, symmetric, no  tenderness/mass/nodules Back: symmetric, no curvature. ROM normal. No CVA tenderness. Lungs: clear to auscultation bilaterally Heart: regular rate and rhythm, S1, S2 normal, no murmur, click, rub or gallop Abdomen: soft, non-tender; bowel sounds normal; no masses,  no organomegaly Pulses: 2+ and symmetric Skin: Skin color, texture, turgor normal. No rashes or lesions Lymph nodes: Cervical, supraclavicular, and axillary nodes normal. Neuro:  awake and interactive with normal mood and affect. Higher cortical functions are normal. Speech is clear without word-finding difficulty or dysarthria. Extraocular movements are intact. Visual fields of both eyes are grossly intact. Sensation to light touch is grossly intact bilaterally of upper and lower extremities. Motor examination shows 4+/5 symmetric hand grip and upper extremity and 5/5 lower extremity strength. There is no pronation or drift.   Lab Results  Component Value Date   HGBA1C 6.0 08/07/2021   HGBA1C 5.8 11/16/2019    Lab Results  Component Value Date   CREATININE 1.22 (H) 08/07/2021   CREATININE 0.92 04/16/2021   CREATININE 1.01 12/25/2020    Lab Results  Component Value Date   WBC 7.0 08/07/2021   HGB 14.9 08/07/2021   HCT 44.2 08/07/2021   PLT 294.0 08/07/2021   GLUCOSE 82 08/07/2021   CHOL 159 11/15/2020   TRIG 80 11/15/2020   HDL 72 11/15/2020   LDLCALC 71 11/15/2020   ALT 19 08/07/2021   AST 20 08/07/2021   NA 139 08/07/2021   K 4.5 08/07/2021   CL 104 08/07/2021   CREATININE 1.22 (H) 08/07/2021   BUN 18 08/07/2021   CO2 26 08/07/2021   TSH 1.54 12/25/2020   HGBA1C 6.0 08/07/2021    DG Bone Density  Result Date: 02/18/2022 EXAM: DUAL X-RAY ABSORPTIOMETRY (DXA) FOR BONE MINERAL DENSITY IMPRESSION: Your patient Martha Wilson completed a BMD test on 02/18/2022 using the [employer] (software version: 14.10) manufactured by [employer]. The following summarizes the results of our  evaluation. Technologist: MTB PATIENT BIOGRAPHICAL: Name: Martha Wilson Patient ID: [id number] Birth Date: April 08, 1951 Height: 64.0 in. Gender: Female Exam Date: 02/18/2022 Weight: 139.0 lbs. Indications: Caucasian, Height Loss, History of Fracture (Adult), Postmenopausal, Tobacco User, Tobacco User (Current Smoker) Fractures: Treatments: DENSITOMETRY RESULTS: Site         Region     Measured Date Measured Age WHO Classification Young Adult T-score BMD         %Change vs. Previous Significant Change (*) AP Spine L1-L2 02/18/20[age] Osteoporosis -3.0 0.805 g/cm2 - - DualFemur Neck Left 02/18/2020 73.1 Osteopenia -2.2 0.735 g/cm2 - - DualFemur Total Mean 02/18/2020 73.1 Osteopenia -2.0 0.750 g/cm2 - - Left Forearm Radius 33% 02/18/20[age].1 Osteopenia -2.2 0.686 g/cm2 - - ASSESSMENT: The BMD measured at AP Spine L1-L2 is 0.805 g/cm2 with a T-score of -3.0. This patient is considered osteoporotic according to [facility]) criteria. The scan quality is good. L-3 and L-4 were excluded due to degenerative changes. World Health Organization [facility]) criteria  for post-menopausal, Caucasian Women: Normal:                   T-score at or above -1 SD Osteopenia/low bone mass: T-score between -1 and -2.5 SD Osteoporosis:             T-score at or below -2.5 SD RECOMMENDATIONS: 1. All patients should optimize calcium and vitamin D intake. 2. Consider FDA-approved medical therapies in postmenopausal women and men aged 67 years and older, based on the following: a. A hip or vertebral(clinical or morphometric) fracture b. T-score < -2.5 at the femoral neck or spine after appropriate evaluation to exclude secondary causes c. Low bone mass (T-score between -1.0 and -2.5 at the femoral neck or spine) and a 10-year probability of a hip fracture > 3% or a 10-year probability of a major osteoporosis-related fracture > 20% based on the [place]-adapted WHO algorithm 3. Clinician judgment and/or patient preferences may  indicate treatment for people with 10-year fracture probabilities above or below these levels FOLLOW-UP: People with diagnosed cases of osteoporosis or at high risk for fracture should have regular bone mineral density tests. For patients eligible for Medicare, routine testing is allowed once every 2 years. The testing frequency can be increased to one year for patients who have rapidly progressing disease, those who are receiving or discontinuing medical therapy to restore bone mass, or have additional risk factors. I have reviewed this report, and agree with the above findings. [facility] Electronically Signed   By: Franki Cabot M.D.   On: 02/18/2022 15:06    Assessment & Plan:   Problem List Items Addressed This Visit     Vesicular rash    By history,  recurring nearly monthly, with no involvement of mucous membranes and no significant pain.  Checking serologies for HSV       Relevant Orders   HSV(herpes simplex vrs) 1+2 ab-IgG   Urge and stress incontinence    Improved with treatment of NPH, but becoming more frequent again. Checking urine today for signs of infection      Tricuspid valve insufficiency, non-rheumatic    She has no signs of right heart failure on exam and last ECHO was done Nov 2023 and noted  mild TR with no RA/RV enlargement.  Continue follow up with Ag[facility] Etang      Tobacco abuse    She is a current some day smoker. She is aware of the risks of heart disease, COPD and CA       Prediabetes - Primary   Relevant Orders   Hemoglobin A1c   Comp Met (CMET)   Osteoporosis    Discussed treatment options,  Calcium and Vit d requirements.  Evista is C/I given her known CAD.  Prolia discussed as the preferred option  given her age and concern regarding possible contraindication to fosomax given her recent choking episode  on a large MVI  .  She is contemplative       Multiple thyroid nodules   Hyperthyroidism    Managed y Dr Gabriel Carina,  with last TSH 1.3 on  methimazole 2.5 mg daily       Elevated hemoglobin (HCC)    Mild, with no prior history , resolved  with repeat testing   Lab Results  Component Value Date   WBC 7.0 08/07/2021   HGB 14.9 08/07/2021   HCT 44.2 08/07/2021   MCV 101.9 (H) 08/07/2021   PLT 294.0 08/07/2021  Dizziness    ECHO  has been repeated  and holter monitor done by cardiology in 2022  to rule out arrhythmia.  No stenosis,  low normal EF.  And no arrhythmia. Likely due to NPH      Balance disorder    Secondary to NPH. Balance has improved s/p placement of v/p shunt by Meade Maw In 2022       Other Visit Diagnoses     Hyperlipidemia, unspecified hyperlipidemia type       Relevant Orders   Lipid Profile   Direct LDL   Urinary frequency       Relevant Orders   Urinalysis, Routine w reflex microscopic   Urine Culture   Need for immunization against influenza       Relevant Orders   Flu Vaccine QUAD High Dose(Fluad) (Completed)       I spent a total of 42  minutes with this patient in a face to face visit on the date of this encounter reviewing the last office visit with me.  Her  most recent visit with cardiology and endocrinology ,   including labs and imaging studies ,   and post visit ordering of testing and therapeutics.    Follow-up: Return in about 3 months (around 06/17/2022).   [name], MD

## 2022-03-19 ENCOUNTER — Encounter: Payer: Self-pay | Admitting: Cardiology

## 2022-03-19 ENCOUNTER — Encounter: Payer: Self-pay | Admitting: Internal Medicine

## 2022-03-19 ENCOUNTER — Ambulatory Visit: Payer: Medicare HMO | Attending: Cardiology | Admitting: Cardiology

## 2022-03-19 VITALS — BP 108/74 | HR 84 | Ht 64.0 in | Wt 135.0 lb

## 2022-03-19 DIAGNOSIS — I2510 Atherosclerotic heart disease of native coronary artery without angina pectoris: Secondary | ICD-10-CM

## 2022-03-19 DIAGNOSIS — I361 Nonrheumatic tricuspid (valve) insufficiency: Secondary | ICD-10-CM | POA: Diagnosis not present

## 2022-03-19 DIAGNOSIS — R238 Other skin changes: Secondary | ICD-10-CM | POA: Insufficient documentation

## 2022-03-19 DIAGNOSIS — B0089 Other herpesviral infection: Secondary | ICD-10-CM | POA: Insufficient documentation

## 2022-03-19 DIAGNOSIS — R9430 Abnormal result of cardiovascular function study, unspecified: Secondary | ICD-10-CM | POA: Diagnosis not present

## 2022-03-19 DIAGNOSIS — R894 Abnormal immunological findings in specimens from other organs, systems and tissues: Secondary | ICD-10-CM | POA: Insufficient documentation

## 2022-03-19 LAB — URINE CULTURE
MICRO NUMBER:: 14297244
Result:: NO GROWTH
SPECIMEN QUALITY:: ADEQUATE

## 2022-03-19 LAB — LIPID PANEL
Cholesterol: 147 mg/dL (ref 0–200)
HDL: 72.4 mg/dL (ref >39.00)
LDL Cholesterol: 50 mg/dL (ref 0–99)
NonHDL: 75.06
Total CHOL/HDL Ratio: 2
Triglycerides: 123 mg/dL (ref 0.0–149.0)
VLDL: 24.6 mg/dL (ref 0.0–40.0)

## 2022-03-19 LAB — URINALYSIS, ROUTINE W REFLEX MICROSCOPIC
Bilirubin Urine: NEGATIVE
Hgb urine dipstick: NEGATIVE
Ketones, ur: NEGATIVE
Leukocytes,Ua: NEGATIVE
Nitrite: NEGATIVE
Specific Gravity, Urine: 1.015 (ref 1.000–1.030)
Total Protein, Urine: NEGATIVE
Urine Glucose: NEGATIVE
Urobilinogen, UA: 0.2 (ref 0.0–1.0)
pH: 6 (ref 5.0–8.0)

## 2022-03-19 LAB — COMPREHENSIVE METABOLIC PANEL
ALT: 20 U/L (ref 0–35)
AST: 20 U/L (ref 0–37)
Albumin: 4.5 g/dL (ref 3.5–5.2)
Alkaline Phosphatase: 103 U/L (ref 39–117)
BUN: 20 mg/dL (ref 6–23)
CO2: 31 mEq/L (ref 19–32)
Calcium: 11.3 mg/dL — ABNORMAL HIGH (ref 8.4–10.5)
Chloride: 101 mEq/L (ref 96–112)
Creatinine, Ser: 1 mg/dL (ref 0.40–1.20)
GFR: 56.01 mL/min — ABNORMAL LOW (ref >60.00)
Glucose, Bld: 82 mg/dL (ref 70–99)
Potassium: 4.2 mEq/L (ref 3.5–5.1)
Sodium: 139 mEq/L (ref 135–145)
Total Bilirubin: 0.5 mg/dL (ref 0.2–1.2)
Total Protein: 7.4 g/dL (ref 6.0–8.3)

## 2022-03-19 LAB — LDL CHOLESTEROL, DIRECT: Direct LDL: 59 mg/dL

## 2022-03-19 LAB — HEMOGLOBIN A1C: Hgb A1c MFr Bld: 6.3 % (ref 4.6–6.5)

## 2022-03-19 LAB — HSV(HERPES SIMPLEX VRS) I + II AB-IGG
HAV 1 IGG,TYPE SPECIFIC AB: 34.2 index — ABNORMAL HIGH
HSV 2 IGG,TYPE SPECIFIC AB: >23 index — ABNORMAL HIGH

## 2022-03-19 NOTE — Assessment & Plan Note (Signed)
By history,  recurring nearly monthly, with no involvement of mucous membranes and no significant pain. 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 .  Suppressive therapy advised

## 2022-03-19 NOTE — Patient Instructions (Signed)
Medication Instructions:   Your physician recommends that you continue on your current medications as directed. Please refer to the Current Medication list given to you today.  *If you need a refill on your cardiac medications before your next appointment, please call your pharmacy*   Lab Work:  None Ordered  If you have labs (blood work) drawn today and your tests are completely normal, you will receive your results only by: [place] (if you have MyChart) OR A paper copy in the mail If you have any lab test that is abnormal or we need to change your treatment, we will call you to review the results.   Testing/Procedures:  None Ordered   Follow-Up: At [facility], you and your health needs are our priority.  As part of our continuing mission to provide you with exceptional heart care, we have created designated Provider Care Teams.  These Care Teams include your primary Cardiologist (physician) and Advanced Practice Providers (APPs -  Physician Assistants and Nurse Practitioners) who all work together to provide you with the care you need, when you need it.  We recommend signing up for the patient portal called "MyChart".  Sign up information is provided on this After Visit Summary.  MyChart is used to connect with patients for Virtual Visits (Telemedicine).  Patients are able to view lab/test results, encounter notes, upcoming appointments, etc.  Non-urgent messages can be sent to your provider as well.   To learn more about what you can do with MyChart, go to [email or web address].    Your next appointment:   12 month(s)  The format for your next appointment:   In Person  Provider:   You may see Kate Sable, MD or one of the following Advanced Practice Providers on your designated Care Team:   Murray Hodgkins, NP Christell Faith, PA-C Cadence Kathlen Mody, PA-C Gerrie Nordmann, NP

## 2022-03-19 NOTE — Assessment & Plan Note (Signed)
Improved with treatment of NPH, but becoming more frequent again. Checking urine today for signs of infection

## 2022-03-19 NOTE — Progress Notes (Signed)
Cardiology Office Note:    Date:  03/19/2022   ID:  Martha Wilson, DOB 1950-04-18, MRN [id number]  PCP:  [name], MD  [facility] Cardiologist:  Kate Sable, MD  [facility] Electrophysiologist:  None   Referring MD: [name], MD   Chief Complaint  Patient presents with   Follow-up    3-4 month follow up, occasional fluttering in chest    History of Present Illness:    Martha Wilson is a 71 y.o. 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  Previous echocardiogram showed mildly reduced ejection fraction, repeat echocardiogram was obtained.  She feels well, no cardiac concerns at this time.  Tricuspid regurgitation previously noted on echo.  Denies chest pain, has palpitations sometimes associated with stress.  Last occurrence was over 2 months ago.  Also has occasional pulsation in her left, plans to follow-up with neurosurgery and ophthalmology.  Prior notes Coronary CTA 04/2021 mild RCA and proximal LAD stenosis. Echo 01/2021, EF 45%, mild to moderate TR left heart cath August 2014 20% proximal LAD lesion, mild diffuse disease in RCA.   Apparently has a history of tricuspid valve prolapse with mild RV dilatation.   Outside echo report 01/2020 showed normal systolic function, EF 93%.  Moderately enlarged RV, RV normal systolic function, RVSP 32 mmHg. Previous cardiologist was in [place].  She still gets yearly echocardiogram for tricuspid valve insufficiency with serial echocardiograms.  Past Medical History:  Diagnosis Date   Allergies    Arthritis    right knee   Headache    History of methicillin resistant staphylococcus aureus (MRSA)    Hyperthyroidism    MVP (mitral valve prolapse)    Normal pressure hydrocephalus (HCC)    Osteopenia    Palpitations    "stress related"   Skin cancer    BASAL CELL   Status post ventriculoperitoneal shunt  06/2020   by Elayne Guerin for NPH   Tricuspid valve prolapse    Wears dentures    partial upper and lower    Past Surgical History:  Procedure Laterality Date   APPLICATION OF CRANIAL NAVIGATION N/A 05/31/2020   Procedure: APPLICATION OF CRANIAL NAVIGATION;  Surgeon: Meade Maw, MD;  Location: [facility];  Service: Neurosurgery;  Laterality: N/A;   BREAST BIOPSY  1980's   pt states she had a bx in the 80's, not sure of side, benign   breat biopsy     CATARACT EXTRACTION W/PHACO Right 09/22/2018   Procedure: CATARACT EXTRACTION PHACO AND INTRAOCULAR LENS PLACEMENT ([place])  RIGHT panooptix;  Surgeon: Birder Robson, MD;  Location: [place];  Service: Ophthalmology;  Laterality: Right;   CATARACT EXTRACTION W/PHACO Left 10/06/2018   Procedure: CATARACT EXTRACTION PHACO AND INTRAOCULAR LENS PLACEMENT ([place]) LEFT PANOPTIX LENS;  Surgeon: Birder Robson, MD;  Location: [place];  Service: Ophthalmology;  Laterality: Left;   COLONOSCOPY     COLONOSCOPY WITH PROPOFOL N/A 11/19/2018   Procedure: COLONOSCOPY WITH PROPOFOL;  Surgeon: Jonathon Bellows, MD;  Location: [facility];  Service: Gastroenterology;  Laterality: N/A;   COLONOSCOPY WITH PROPOFOL N/A 09/21/2021   Procedure: COLONOSCOPY WITH PROPOFOL;  Surgeon: Jonathon Bellows, MD;  Location: [facility];  Service: Gastroenterology;  Laterality: N/A;   ORIF FOOT FRACTURE     VENTRICULOPERITONEAL SHUNT Right 05/31/2020   Procedure: SHUNT INSERTION VENTRICULAR-PERITONEAL;  Surgeon: Meade Maw, MD;  Location: [facility];  Service: Neurosurgery;  Laterality: Right;  Dr Lysle Pearl to assist    Current Medications: Current Meds  Medication Sig   acetaminophen (TYLENOL) 500 MG tablet Take 1,000 mg by mouth every 6 (six) hours as needed for moderate pain or headache.   aspirin EC 81 MG tablet Take 1 tablet (81 mg total) by mouth daily. Swallow whole.   atorvastatin (LIPITOR) 20 MG tablet Take 1 tablet (20 mg total) by  mouth daily.   calcium carbonate (OSCAL) 1500 (600 Ca) MG TABS tablet Take 1,800 mg by mouth daily with breakfast.   cholecalciferol (VITAMIN D3) 25 MCG (1000 UNIT) tablet Take 2,000 Units by mouth daily.   Cyanocobalamin (B-12) 2500 MCG TABS Take 2,500 mcg by mouth daily.   cycloSPORINE (RESTASIS) 0.05 % ophthalmic emulsion 1 drop 2 (two) times daily.   methimazole (TAPAZOLE) 5 MG tablet Take 0.5 tablets (2.5 mg total) by mouth at bedtime.   Multiple Vitamins-Minerals (PRESERVISION AREDS 2) CAPS Take 1 capsule by mouth 2 (two) times daily.   Polyvinyl Alcohol-Povidone (REFRESH OP) Place 1 drop into both eyes 2 (two) times daily as needed (dry eye).     Allergies:   Bee venom, Latex, Penicillins, and Adhesive [tape]   Social History   Socioeconomic History   Marital status: Single    Spouse name: Not on file   Number of children: Not on file   Years of education: Not on file   Highest education level: Not on file  Occupational History   Not on file  Tobacco Use   Smoking status: Some Days    Packs/day: 0.50    Years: 35.00    Total pack years: 17.50    Types: Cigarettes   Smokeless tobacco: Never   Tobacco comments:    "occasional" cigarette  Vaping Use   Vaping Use: Never used  Substance and Sexual Activity   Alcohol use: Yes    Alcohol/week: 3.0 standard drinks of alcohol    Types: 3 Glasses of wine per week    Comment: OCC   Drug use: Never   Sexual activity: Not Currently  Other Topics Concern   Not on file  Social History Narrative   Not on file   Social Determinants of Health   Financial Resource Strain: Low Risk  (02/19/2022)   Overall Financial Resource Strain (CARDIA)    Difficulty of Paying Living Expenses: Not hard at all  Food Insecurity: No Food Insecurity (02/19/2022)   Hunger Vital Sign    Worried About Running Out of Food in the Last Year: Never true    Ran Out of Food in the Last Year: Never true  Transportation Needs: No Transportation Needs  (02/19/2022)   PRAPARE - Hydrologist (Medical): No    Lack of Transportation (Non-Medical): No  Physical Activity: Insufficiently Active (02/19/2022)   Exercise Vital Sign    Days of Exercise per Week: 7 days    Minutes of Exercise per Session: 20 min  Stress: No Stress Concern Present (02/19/2022)   [place]    Feeling of Stress : Not at all  Social Connections: Unknown (02/19/2022)   Social Connection and Isolation Panel [NHANES]    Frequency of Communication with Friends and Family: More than three times a week    Frequency of Social Gatherings with Friends and Family: More than three times a week    Attends Religious Services: Not on file    Active Member of Clubs or Organizations: Not on  file    Attends Archivist Meetings: Not on file    Marital Status: Not on file     Family History: The patient's family history includes Early death in her brother; Heart attack in her father; Thyroid disease in her brother.  ROS:   Please see the history of present illness.     All other systems reviewed and are negative.  EKGs/Labs/Other Studies Reviewed:    The following studies were reviewed today:  EKG:  EKG not ordered today.   Recent Labs: 08/07/2021: Hemoglobin 14.9; Platelets 294.0 03/18/2022: ALT 20; BUN 20; Creatinine, Ser 1.00; Potassium 4.2; Sodium 139  Recent Lipid Panel    Component Value Date/Time   CHOL 147 03/18/2022 1611   TRIG 123.0 03/18/2022 1611   HDL 72.40 03/18/2022 1611   CHOLHDL 2 03/18/2022 1611   VLDL 24.6 03/18/2022 1611   LDLCALC 50 03/18/2022 1611   LDLDIRECT 59.0 03/18/2022 1611     Risk Assessment/Calculations:      Physical Exam:    VS:  BP 108/74 (BP Location: Left Arm, Patient Position: Sitting, Cuff Size: Normal)   Pulse 84   Ht '5\' 4"'$  (1.626 m)   Wt 135 lb (61.2 kg)   SpO2 98%   BMI 23.17 kg/m     Wt Readings from Last 3  Encounters:  03/19/22 135 lb (61.2 kg)  03/18/22 135 lb (61.2 kg)  02/19/22 133 lb (60.3 kg)     GEN:  Well nourished, well developed in no acute distress HEENT: Normal NECK: No JVD; No carotid bruits CARDIAC: RRR, no murmurs, rubs, gallops RESPIRATORY:  Clear to auscultation without rales, wheezing or rhonchi  ABDOMEN: Soft, non-tender, non-distended MUSCULOSKELETAL:  No edema; No deformity  SKIN: Warm and dry NEUROLOGIC:  Alert and oriented x 3 PSYCHIATRIC:  Normal affect   ASSESSMENT:    1. Coronary artery disease involving native coronary artery of native heart without angina pectoris   2. Nonrheumatic tricuspid valve regurgitation   3. Ejection fraction < 50%    PLAN:    In order of problems listed above:  Nonobstructive CAD, mild disease in proximal LAD and mid RCA.  Calcium score 315.  Denies chest pain.  Continue aspirin 81 mg, Lipitor 20 mg daily. Mild TR on last echo 02/2022. History of mildly reduced ejection fraction, EF 45%.  Repeat echo 02/2022 EF normalized 50 to 55%.  Patient is euvolemic, asymptomatic.  Avoiding GDMT due to low normal BPs, NPH with VP shunt, dizziness .   Follow-up in 1 year.   Medication Adjustments/Labs and Tests Ordered: Current medicines are reviewed at length with the patient today.  Concerns regarding medicines are outlined above.  No orders of the defined types were placed in this encounter.   No orders of the defined types were placed in this encounter.      Signed, Kate Sable, MD  03/19/2022 10:37 AM    [place]

## 2022-03-19 NOTE — Assessment & Plan Note (Signed)
Discussed treatment options,  Calcium and Vit d requirements.  Evista is C/I given her known CAD.  Prolia discussed as the preferred option  given her age and concern regarding possible contraindication to fosomax given her recent choking episode  on a large MVI  .  She is contemplative

## 2022-03-19 NOTE — Assessment & Plan Note (Signed)
Secondary to NPH. Balance has improved s/p placement of v/p shunt by Meade Maw In 2022

## 2022-03-19 NOTE — Assessment & Plan Note (Addendum)
ECHO  has been repeated  and holter monitor done by cardiology in 2022  to rule out arrhythmia.  No stenosis,  low normal EF.  And no arrhythmia. Likely due to NPH

## 2022-03-19 NOTE — Assessment & Plan Note (Signed)
Managed y Dr Gabriel Carina,  with last TSH 1.3 on methimazole 2.5 mg daily

## 2022-03-19 NOTE — Assessment & Plan Note (Signed)
She is a current some day smoker. She is aware of the risks of heart disease, COPD and CA

## 2022-03-19 NOTE — Assessment & Plan Note (Signed)
She has no signs of right heart failure on exam and last ECHO was done Nov 2023 and noted  mild TR with no RA/RV enlargement.  Continue follow up with [facility] Charlestine Night

## 2022-03-19 NOTE — Assessment & Plan Note (Signed)
Mild, with no prior history , resolved  with repeat testing   Lab Results  Component Value Date   WBC 7.0 08/07/2021   HGB 14.9 08/07/2021   HCT 44.2 08/07/2021   MCV 101.9 (H) 08/07/2021   PLT 294.0 08/07/2021

## 2022-03-20 NOTE — Telephone Encounter (Signed)
Abstraction  

## 2022-03-21 ENCOUNTER — Ambulatory Visit: Payer: Medicare HMO | Admitting: Neurosurgery

## 2022-03-21 ENCOUNTER — Encounter: Payer: Self-pay | Admitting: Neurosurgery

## 2022-03-21 ENCOUNTER — Telehealth: Payer: Self-pay | Admitting: Internal Medicine

## 2022-03-21 VITALS — BP 118/78 | HR 75 | Temp 98.3°F | Wt 134.0 lb

## 2022-03-21 DIAGNOSIS — G912 (Idiopathic) normal pressure hydrocephalus: Secondary | ICD-10-CM | POA: Diagnosis not present

## 2022-03-21 DIAGNOSIS — Z982 Presence of cerebrospinal fluid drainage device: Secondary | ICD-10-CM

## 2022-03-21 MED ORDER — VALACYCLOVIR HCL 500 MG PO TABS: 500.0000 mg | ORAL_TABLET | Freq: Every day | ORAL | 1 refills | Status: DC

## 2022-03-21 NOTE — Progress Notes (Signed)
    DOS: 05/31/20 - R VPS Codman Hakim  HISTORY OF PRESENT ILLNESS:   03/21/2022  Martha Wilson is status post right ventriculoperitoneal shunt placement. she is doing well.     PHYSICAL EXAMINATION: Vitals:   03/21/22 1536  BP: 118/78  Pulse: 75  Temp: 98.3 F (36.8 C)    General: Patient is well developed, well nourished, calm, collected, and in no apparent distress.  NEUROLOGICAL: General: In no acute distress. Awake, alert, oriented to person, place, and time. Pupils equal round and reactive to light. Facial tone is symmetric. Tongue protrusion is midline. There is no pronator drift.  Strength: 5/5 t/o  Incision c/d/i  ROS (Neurologic): Negative except as noted above  MEDICAL DECISION-MAKING:  ASSESSMENT/PLAN: Martha Wilson is doing well after VPS.  She is currently stable.  I will see her back in a year.    I spent a total of 15 minutes in both face-to-face and non-face-to-face activities for this visit on the date of this encounter.  Advised to contact the office if any questions or concerns arise.  Martha Wilson K. Martha Ribas MD, [place]

## 2022-03-21 NOTE — Addendum Note (Signed)
Addended by: [name] on: 03/21/2022 09:41 AM   Modules accepted: Orders

## 2022-03-21 NOTE — Telephone Encounter (Signed)
Prescription Request  03/21/2022  Is this a "Controlled Substance" medicine? No  LOV: 03/18/2022  What is the name of the medication or equipment? valACYclovir (VALTREX) 500 MG tablet   Have you contacted your pharmacy to request a refill? No   Which pharmacy would you like this sent to?  [id number] [address]Phone: [phone]: [phone]   Patient notified that their request is being sent to the clinical staff for review and that they should receive a response within 2 business days.   Please advise at Mobile [phone](mobile)

## 2022-03-21 NOTE — Telephone Encounter (Signed)
LMTCB

## 2022-03-21 NOTE — Telephone Encounter (Signed)
Spoke with pt and she stated that she is aware that the rx has already been sent to pharmacy.   Pt also wanted to let you know that she is doing her research on osteoporosis medications but would like to know which one you would recommend?

## 2022-03-25 ENCOUNTER — Other Ambulatory Visit (INDEPENDENT_AMBULATORY_CARE_PROVIDER_SITE_OTHER): Payer: Medicare HMO

## 2022-03-25 LAB — BASIC METABOLIC PANEL
BUN: 21 mg/dL (ref 6–23)
CO2: 23 mEq/L (ref 19–32)
Calcium: 9.3 mg/dL (ref 8.4–10.5)
Chloride: 104 mEq/L (ref 96–112)
Creatinine, Ser: 1.1 mg/dL (ref 0.40–1.20)
GFR: 49.95 mL/min — ABNORMAL LOW (ref >60.00)
Glucose, Bld: 109 mg/dL — ABNORMAL HIGH (ref 70–99)
Potassium: 4.4 mEq/L (ref 3.5–5.1)
Sodium: 137 mEq/L (ref 135–145)

## 2022-03-27 LAB — PTH, INTACT AND CALCIUM
Calcium: 9.3 mg/dL (ref 8.6–10.4)
PTH: 54 pg/mL (ref 16–77)

## 2022-04-02 ENCOUNTER — Telehealth: Payer: Self-pay | Admitting: Internal Medicine

## 2022-04-02 NOTE — Telephone Encounter (Signed)
Pt would like to know if Prolia is the osteoporosis medication that allows bone growth.

## 2022-04-02 NOTE — Telephone Encounter (Signed)
Pt called stating her phone died and she was returning the call

## 2022-04-03 NOTE — Telephone Encounter (Signed)
Pt is in agreement to start the Prolia.

## 2022-04-03 NOTE — Telephone Encounter (Signed)
See previous message

## 2022-04-04 ENCOUNTER — Telehealth: Payer: Self-pay | Admitting: *Deleted

## 2022-04-04 NOTE — Telephone Encounter (Signed)
Benefit Verification submitted in [employer] portal for Prolia.

## 2022-04-16 NOTE — Telephone Encounter (Signed)
$  0 due for Prolia (new candidate), PA required (sent to prior auth team)

## 2022-04-23 NOTE — Telephone Encounter (Signed)
Requesting PA update

## 2022-04-25 ENCOUNTER — Other Ambulatory Visit (HOSPITAL_COMMUNITY): Payer: Self-pay

## 2022-04-25 NOTE — Telephone Encounter (Signed)
Pharmacy Patient Advocate Encounter   Received notification that prior authorization for Prolia '60mg'$  is required/requested.  PA submitted on 04/25/22 to (ins) [employer] via [facility] (fax) Key [id number] Status is pending

## 2022-04-26 ENCOUNTER — Other Ambulatory Visit: Payer: Self-pay | Admitting: *Deleted

## 2022-04-26 ENCOUNTER — Encounter: Payer: Self-pay | Admitting: *Deleted

## 2022-04-26 DIAGNOSIS — M816 Localized osteoporosis [Lequesne]: Secondary | ICD-10-CM

## 2022-04-26 NOTE — Telephone Encounter (Signed)
Left voicemail to return call & sent mychart message.  $0 due for first Prolia Injection & PA approved.

## 2022-04-26 NOTE — Telephone Encounter (Signed)
Patient Advocate Encounter   Prior Authorization for [employer] Syr '60MG'$ /ML has been approved.      Effective: 04-25-2022 to 04-26-2023

## 2022-04-26 NOTE — Progress Notes (Unsigned)
Pt to have Vit D lab drawn prior to starting on Prolia

## 2022-04-26 NOTE — Telephone Encounter (Signed)
Pt returned call & will come in Monday morning to have Vit D lab checked prior to scheduling Prolia Injection.

## 2022-04-26 NOTE — Telephone Encounter (Signed)
Patient Advocate Encounter  Prior Authorization for [employer] Syr '60MG'$ /ML has been approved.     Effective: 04-25-2022 to 04-26-2023

## 2022-04-29 ENCOUNTER — Other Ambulatory Visit (INDEPENDENT_AMBULATORY_CARE_PROVIDER_SITE_OTHER): Payer: Medicare HMO

## 2022-04-29 DIAGNOSIS — M816 Localized osteoporosis [Lequesne]: Secondary | ICD-10-CM

## 2022-04-29 LAB — VITAMIN D 25 HYDROXY (VIT D DEFICIENCY, FRACTURES): VITD: 44.58 ng/mL (ref 30.00–100.00)

## 2022-04-29 NOTE — Telephone Encounter (Signed)
Discussed with patient at lab appt today.   Pt would also like to discuss Prolia with her dentist on Thursday of this week.   Pt aware to let me know when she is ready to proceed.   Pt also aware that there is $0 due & PA approved

## 2022-04-30 ENCOUNTER — Ambulatory Visit: Payer: Medicare HMO | Admitting: Podiatry

## 2022-05-08 NOTE — Telephone Encounter (Signed)
Appt scheduled on 05/28/22

## 2022-05-08 NOTE — Telephone Encounter (Signed)
Spoke with patient over the phone & scheduled Prolia injection. Pt aware of cost & appt.

## 2022-05-24 ENCOUNTER — Ambulatory Visit: Payer: Medicare HMO | Admitting: Podiatry

## 2022-05-24 ENCOUNTER — Encounter: Payer: Self-pay | Admitting: Podiatry

## 2022-05-24 VITALS — BP 115/71 | HR 74

## 2022-05-24 DIAGNOSIS — B351 Tinea unguium: Secondary | ICD-10-CM | POA: Diagnosis not present

## 2022-05-24 DIAGNOSIS — M79674 Pain in right toe(s): Secondary | ICD-10-CM | POA: Diagnosis not present

## 2022-05-24 DIAGNOSIS — M79675 Pain in left toe(s): Secondary | ICD-10-CM

## 2022-05-24 NOTE — Progress Notes (Signed)
Subjective:  Patient ID: Martha Wilson, female    DOB: 01-Aug-1950,  MRN: [id number]  Chief Complaint  Patient presents with   Nail Problem    "He's going to cut my toenails."    72 y.o. female presents with the above complaint.  Patient presents with thickened elongated dystrophic toenails x10.  Pain to palpation.  They are not able to do with her themselves.  They would like for [place] to debride them down.  She does not have any secondary complaints at this time.  Review of Systems: Negative except as noted in the HPI. Denies N/V/F/Ch.  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 Swallow whole., Disp: 30 tablet, Rfl: 5   atorvastatin (LIPITOR) 20 MG tablet, Take 1 tablet (20 mg total) by mouth daily., Disp: 90 tablet, Rfl: 3   calcium carbonate (OSCAL) 1500 (600 Ca) MG TABS tablet, Take 1,800 mg by mouth daily with breakfast., Disp: , Rfl:    cholecalciferol (VITAMIN D3) 25 MCG (1000 UNIT) tablet, Take 2,000 Units by mouth daily., Disp: , Rfl:    Cyanocobalamin (B-12) 2500 MCG TABS, Take 2,500 mcg by mouth daily., Disp: , Rfl:    cycloSPORINE (RESTASIS) 0.05 % ophthalmic emulsion, 1 drop 2 (two) times daily., Disp: , Rfl:    methimazole (TAPAZOLE) 5 MG tablet, Take 0.5 tablets (2.5 mg total) by mouth at  bedtime., Disp: 90 tablet, Rfl: 1   Multiple Vitamins-Minerals (PRESERVISION AREDS 2) CAPS, Take 1 capsule by mouth 2 (two) times daily., Disp: , Rfl:    Polyvinyl Alcohol-Povidone (REFRESH OP), Place 1 drop into both eyes 2 (two) times daily as needed (dry eye)., Disp: , Rfl:    valACYclovir (VALTREX) 500 MG tablet, Take 1 tablet (500 mg total) by mouth daily., Disp: 90 tablet, Rfl: 1  Social History   Tobacco Use  Smoking Status Former   Packs/day: 0.50   Years: 35.00   Total pack years: 17.50   Types: Cigarettes   Quit date: 05/22/2022  Smokeless Tobacco Never  Tobacco Comments   "occasional" cigarette    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 Constitutional Well developed. Well nourished.  Vascular Dorsalis pedis pulses palpable bilaterally. Posterior tibial pulses palpable bilaterally. Capillary refill normal to all digits.  No cyanosis or clubbing noted. Pedal hair growth normal.  Neurologic Normal speech. Oriented to person, place, and time. Epicritic sensation to light touch grossly present bilaterally.  Dermatologic  dystrophic mycotic nail noted to the left hallux that seems to be slightly more severe than the ones listed below.  Mild pain on palpation. Nail examination shows thickened elongated dystrophic mycotic nails x10.  Pain on palpation Skin within normal limits  Orthopedic: Normal joint ROM without pain or crepitus bilaterally. No visible deformities. No bony tenderness.   Radiographs: None Assessment:   No diagnosis found.      Plan:  Patient was evaluated and treated and all questions answered.   Onychomycosis with pain  -Nails palliatively debrided as  below. -Educated on self-care  Procedure: Nail Debridement Rationale: pain  Type of Debridement: manual, sharp debridement. Instrumentation: Nail nipper, rotary burr. Number of Nails: 10  Procedures and Treatment: Consent by patient was obtained for treatment procedures. The patient understood the discussion of treatment and procedures well. All questions were answered thoroughly reviewed. Debridement of mycotic and hypertrophic toenails, 1 through 5 bilateral and clearing of subungual debris. No ulceration, no infection noted.  Return Visit-Office Procedure: Patient instructed to return to the office for a follow up visit 3 months for continued evaluation and treatment.  Boneta Lucks, DPM    No follow-ups on file.   No follow-ups on file.

## 2022-05-28 ENCOUNTER — Ambulatory Visit (INDEPENDENT_AMBULATORY_CARE_PROVIDER_SITE_OTHER): Payer: Medicare HMO

## 2022-05-28 MED ORDER — DENOSUMAB 60 MG/ML ~~LOC~~ SOSY
60.0000 mg | PREFILLED_SYRINGE | Freq: Once | SUBCUTANEOUS | Status: AC
  Administered 2022-05-28: 60 mg via SUBCUTANEOUS

## 2022-05-28 NOTE — Progress Notes (Signed)
Patient presented for 1st Prolia injection to left arm,  Pt sat in office for the 15 mins after receiving the injection patient voiced no concerns nor showed any signs of distress during injection

## 2022-06-26 ENCOUNTER — Encounter: Payer: Self-pay | Admitting: Internal Medicine

## 2022-06-26 ENCOUNTER — Telehealth (INDEPENDENT_AMBULATORY_CARE_PROVIDER_SITE_OTHER): Payer: Medicare HMO | Admitting: Internal Medicine

## 2022-06-26 VITALS — Ht 64.0 in | Wt 134.0 lb

## 2022-06-26 DIAGNOSIS — R110 Nausea: Secondary | ICD-10-CM

## 2022-06-26 DIAGNOSIS — E0590 Thyrotoxicosis, unspecified without thyrotoxic crisis or storm: Secondary | ICD-10-CM | POA: Diagnosis not present

## 2022-06-26 DIAGNOSIS — G912 (Idiopathic) normal pressure hydrocephalus: Secondary | ICD-10-CM

## 2022-06-26 DIAGNOSIS — B0089 Other herpesviral infection: Secondary | ICD-10-CM

## 2022-06-26 DIAGNOSIS — R7303 Prediabetes: Secondary | ICD-10-CM

## 2022-06-26 DIAGNOSIS — M816 Localized osteoporosis [Lequesne]: Secondary | ICD-10-CM

## 2022-06-26 DIAGNOSIS — E559 Vitamin D deficiency, unspecified: Secondary | ICD-10-CM

## 2022-06-26 DIAGNOSIS — I2583 Coronary atherosclerosis due to lipid rich plaque: Secondary | ICD-10-CM

## 2022-06-26 DIAGNOSIS — I2510 Atherosclerotic heart disease of native coronary artery without angina pectoris: Secondary | ICD-10-CM

## 2022-06-26 MED ORDER — ONDANSETRON 4 MG PO TBDP: 4.0000 mg | ORAL_TABLET | Freq: Three times a day (TID) | ORAL | 0 refills | Status: DC | PRN

## 2022-06-26 NOTE — Assessment & Plan Note (Signed)
SHE IS EXERCISING AND FOLLOWING A LOW GI  DIET

## 2022-06-26 NOTE — Assessment & Plan Note (Addendum)
Managed by [facility],  with methimazole 2.5 mg daily . Last TSH was normal in October 2023

## 2022-06-26 NOTE — Patient Instructions (Signed)
For herpes outbreak  TAKE THE VALACYCLOVIR 1000 mg three times daily for 7 days   Then   Resume daily dose of 500 mg to prevent more outbreaks    For your back pain : (muscle strain)   You can take 3000 mg tylenol daily for one week (1000 mg every 8 hours)-  Add aleve every 12 hours if needed for back pain

## 2022-06-26 NOTE — Assessment & Plan Note (Signed)
She tolerated Prolia injection Jan 2024,  continue  every 6 months

## 2022-06-26 NOTE — Progress Notes (Addendum)
Virtual Visit via [place]   Note   This format is felt to be most appropriate for this patient at this time.  All issues noted in this document were discussed and addressed.  No physical exam was performed (except for noted visual exam findings with Video Visits).   I connected withNAME@ on 06/26/22 at 10:00 AM EDT by a video enabled telemedicine application or telephone and verified that I am speaking with the correct person using two identifiers. Location patient: home Location provider: work or home office Persons participating in the virtual visit: patient, provider  I discussed the limitations, risks, security and privacy concerns of performing an evaluation and management service by telephone and the availability of in person appointments. I also discussed with the patient that there may be a patient responsible charge related to this service. The patient expressed understanding and agreed to proceed.   Reason for visit:follow up on  1)  HSV recurrent  now on suppressive therapy  2) osteoporosis  3) dizziness 4) CAD   HPI:  Nausea:  woke up this morning with nausea . Multiple Sick contacts Patient changed visit to virtual after waking up with nausea subjective fevers and malaise following contact with COVID positive  friend on   Monday and sick mother yesterday (who was vomiting)    Weight gain (subjective) 3 lbs.  .  Doing   the KETO diet.   Reviewed the effect of high animal protein diet on cholesterol.  Taking crestor   Herpes outbreak. Has not been taking valacyclovir daily to suppress.  Reviewed treatment vs suppressive oseses.   Morning headaches and dizziness getting  better  Osteoporosis: received her fist Prolia injection in January. No side effects/    ROS: See pertinent positives and negatives per HPI.  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)  RIGHT panooptix;  Surgeon: Birder Robson, MD;  Location: [place];  Service: Ophthalmology;  Laterality: Right;   CATARACT EXTRACTION W/PHACO Left 10/06/2018   Procedure: CATARACT EXTRACTION PHACO AND INTRAOCULAR LENS PLACEMENT ([place]) LEFT PANOPTIX LENS;  Surgeon: Birder Robson, MD;  Location: [place];  Service: Ophthalmology;  Laterality: Left;   COLONOSCOPY     COLONOSCOPY WITH PROPOFOL N/A 11/19/2018   Procedure: COLONOSCOPY WITH PROPOFOL;  Surgeon: Jonathon Bellows, MD;  Location: [facility];  Service: Gastroenterology;  Laterality: N/A;   COLONOSCOPY WITH PROPOFOL N/A 09/21/2021   Procedure: COLONOSCOPY WITH PROPOFOL;  Surgeon: Vicente Males,  Bailey Mech, MD;  Location: [place];  Service: Gastroenterology;  Laterality: N/A;   ORIF FOOT FRACTURE     VENTRICULOPERITONEAL SHUNT Right 05/31/2020   Procedure: SHUNT INSERTION VENTRICULAR-PERITONEAL;  Surgeon: Meade Maw, MD;  Location: [facility];  Service: Neurosurgery;  Laterality: Right;  Dr Lysle Pearl to assist    Family History  Problem Relation Age of Onset   Heart attack Father    Thyroid disease Brother    Early death Brother      SOCIAL HX:  reports that she quit smoking about 5 weeks ago. Her smoking use included cigarettes. She has a 17.50 pack-year smoking history. She has never used smokeless tobacco. She reports current alcohol use of about 3.0 standard drinks of alcohol per week. She reports that she does not use drugs.    Current Outpatient Medications:    acetaminophen (TYLENOL) 500 MG tablet, Take 1,000 mg by mouth every 6 (six) hours as needed for moderate pain or headache., Disp: , Rfl:    aspirin EC 81 MG tablet, Take 1 tablet (81 mg total) by mouth daily. Swallow whole., Disp: 30 tablet, Rfl: 5   atorvastatin (LIPITOR) 20 MG tablet, Take 1 tablet (20 mg total) by mouth daily., Disp: 90 tablet, Rfl: 3   calcium carbonate (OSCAL) 1500 (600 Ca) MG TABS tablet, Take 1,800 mg by mouth daily with breakfast., Disp: , Rfl:    cholecalciferol (VITAMIN D3) 25 MCG (1000 UNIT) tablet, Take 2,000 Units by mouth daily., Disp: , Rfl:    Cyanocobalamin (B-12) 2500 MCG TABS, Take 2,500 mcg by mouth daily., Disp: , Rfl:    cycloSPORINE (RESTASIS) 0.05 % ophthalmic emulsion, 1 drop 2 (two) times daily., Disp: , Rfl:    methimazole (TAPAZOLE) 5 MG tablet, Take 0.5 tablets (2.5 mg total) by mouth at bedtime., Disp: 90 tablet, Rfl: 1   Multiple Vitamins-Minerals (PRESERVISION AREDS 2) CAPS, Take 1 capsule by mouth 2 (two) times daily., Disp: , Rfl:    ondansetron (ZOFRAN-ODT) 4 MG disintegrating tablet, Take 1 tablet (4 mg total) by mouth every 8 (eight) hours as needed for nausea or vomiting., Disp: 20 tablet, Rfl: 0   Polyvinyl Alcohol-Povidone (REFRESH OP), Place 1 drop into both eyes 2 (two) times daily as needed (dry eye)., Disp: , Rfl:    valACYclovir (VALTREX) 500 MG tablet, Take 1 tablet (500 mg total) by mouth daily., Disp: 90 tablet, Rfl: 1  EXAM:  VITALS per patient if applicable:  GENERAL: alert, oriented, appears well and in no acute distress  HEENT: atraumatic, conjunttiva clear, no obvious abnormalities on  inspection of external nose and ears  NECK: normal movements of the head and neck  LUNGS: on inspection no signs of respiratory distress, breathing rate appears normal, no obvious gross SOB, gasping or wheezing  CV: no obvious cyanosis  MS: moves all visible extremities without noticeable abnormality  PSYCH/NEURO: pleasant and cooperative, no obvious depression or anxiety, speech and thought processing grossly intact  ASSESSMENT AND PLAN: Localized osteoporosis without current pathological fracture Assessment & Plan: She tolerated Prolia injection Jan 2024,  continue  every 6 months    Hyperthyroidism Assessment & Plan: Managed by [facility],  with methimazole 2.5 mg daily . Last TSH was normal in October 2023   Orders: -     TSH; Future  Coronary artery disease due to lipid rich plaque -     LDL cholesterol, direct; Future -     Lipid panel; Future  Prediabetes Assessment & Plan: SHE IS EXERCISING AND  FOLLOWING A LOW GI  DIET   Orders: -     Hemoglobin A1c; Future -     Comprehensive metabolic panel; Future  NPH (normal pressure hydrocephalus) (HCC) Assessment & Plan: SHE HAS had recent follow up with Dr Izora Ribas .  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  Advised to take 1000 mg of valtrex tid x 7 days,  then continue 500 mg daily    Nausea Assessment & Plan: New onset .  After multiple sick contacts in the last 48 hours including COVID 19 and probable rotavirus.  Zofran sent to pharmacy,  will test at home    Vitamin D deficiency -     VITAMIN D 25 Hydroxy (Vit-D Deficiency, Fractures); Future  Other orders -     Ondansetron; Take 1 tablet (4 mg total) by mouth every 8 (eight) hours as needed for nausea or vomiting.  Dispense: 20 tablet; Refill: 0      I discussed the assessment and  treatment plan with the patient. The patient was provided an opportunity to ask questions and all were answered. The patient agreed with the plan and demonstrated an understanding of the instructions.   The patient was advised to call back or seek an in-person evaluation if the symptoms worsen or if the condition fails to improve as anticipated.   I spent 30 minutes dedicated to the care of this patient on the date of this encounter to include pre-visit review of his medical history,  Face-to-face time with the patient , and post visit ordering of testing and therapeutics.    [name], MD

## 2022-06-26 NOTE — Assessment & Plan Note (Signed)
SHE HAS had recent follow up with Dr Izora Ribas .  No changes to shunt settings .dizziness and morning headaches are improving

## 2022-06-26 NOTE — Assessment & Plan Note (Signed)
New onset .  After multiple sick contacts in the last 48 hours including COVID 19 and probable rotavirus.  Zofran sent to pharmacy,  will test at home

## 2022-06-26 NOTE — Addendum Note (Signed)
Addended by: [name] on: 06/26/2022 10:51 AM   Modules accepted: Orders

## 2022-06-26 NOTE — Assessment & Plan Note (Signed)
She has not been taking the suppressive therapy.  Advised to take 1000 mg of valtrex tid x 7 days,  then continue 500 mg daily

## 2022-07-11 ENCOUNTER — Ambulatory Visit
Admission: RE | Admit: 2022-07-11 | Discharge: 2022-07-11 | Disposition: A | Discharge status: Home / Self Care | Payer: Medicare HMO | Source: Ambulatory Visit | Attending: Pulmonary Disease | Admitting: Pulmonary Disease

## 2022-07-11 DIAGNOSIS — R911 Solitary pulmonary nodule: Secondary | ICD-10-CM | POA: Diagnosis present

## 2022-07-12 ENCOUNTER — Other Ambulatory Visit: Payer: Self-pay

## 2022-07-12 DIAGNOSIS — R911 Solitary pulmonary nodule: Secondary | ICD-10-CM

## 2022-07-23 ENCOUNTER — Telehealth: Payer: Self-pay | Admitting: Internal Medicine

## 2022-07-23 NOTE — Telephone Encounter (Signed)
Pt is aware and gave a verbal understanding.  

## 2022-07-23 NOTE — Telephone Encounter (Signed)
Pt called wanting to know if she can take Claritin for her allergies and zaditor for her eyes

## 2022-08-22 ENCOUNTER — Other Ambulatory Visit: Payer: Self-pay | Admitting: Podiatry

## 2022-08-22 ENCOUNTER — Ambulatory Visit: Payer: Managed Care, Other (non HMO) | Admitting: Podiatry

## 2022-08-22 DIAGNOSIS — B351 Tinea unguium: Secondary | ICD-10-CM | POA: Diagnosis not present

## 2022-08-22 DIAGNOSIS — M79674 Pain in right toe(s): Secondary | ICD-10-CM

## 2022-08-22 DIAGNOSIS — M79675 Pain in left toe(s): Secondary | ICD-10-CM | POA: Diagnosis not present

## 2022-08-22 MED ORDER — CICLOPIROX 8 % EX SOLN: Freq: Every day | CUTANEOUS | 0 refills | Status: DC

## 2022-08-22 NOTE — Progress Notes (Signed)
Subjective:  Patient ID: Martha Wilson, female    DOB: July 26, 1950,  MRN: [id number]  Chief Complaint  Patient presents with   Nail Problem    Nail trim    72 y.o. female presents with the above complaint.  Patient presents with thickened elongated dystrophic toenails x10.  Pain to palpation.  They are not able to do with her themselves.  They would like for [place] to debride them down.  She does not have any secondary complaints at this time.  Review of Systems: Negative except as noted in the HPI. Denies N/V/F/Ch.  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 Apply over nail and surrounding skin. Apply daily over previous coat. After seven (7) days, may remove with alcohol and continue cycle., Disp: 6.6 mL, Rfl: 0   acetaminophen (TYLENOL) 500 MG tablet, Take 1,000 mg by mouth every 6 (six) hours as needed for moderate pain or headache., Disp: , Rfl:    aspirin EC 81 MG tablet, Take 1 tablet (81 mg total) by mouth daily. Swallow whole., Disp: 30 tablet, Rfl: 5   atorvastatin (LIPITOR) 20 MG tablet, Take 1 tablet (20 mg total) by mouth daily., Disp: 90 tablet, Rfl: 3   calcium carbonate (OSCAL) 1500 (600 Ca) MG TABS tablet, Take 1,800 mg by mouth daily with breakfast., Disp: , Rfl:    cholecalciferol (VITAMIN D3) 25 MCG (1000 UNIT) tablet, Take 2,000 Units by mouth daily., Disp: , Rfl:    Cyanocobalamin (B-12) 2500 MCG TABS, Take 2,500 mcg by mouth  daily., Disp: , Rfl:    cycloSPORINE (RESTASIS) 0.05 % ophthalmic emulsion, 1 drop 2 (two) times daily., Disp: , Rfl:    methimazole (TAPAZOLE) 5 MG tablet, Take 0.5 tablets (2.5 mg total) by mouth at bedtime., Disp: 90 tablet, Rfl: 1   Multiple Vitamins-Minerals (PRESERVISION AREDS 2) CAPS, Take 1 capsule by mouth 2 (two) times daily., Disp: , Rfl:    ondansetron (ZOFRAN-ODT) 4 MG disintegrating tablet, Take 1 tablet (4 mg total) by mouth every 8 (eight) hours as needed for nausea or vomiting., Disp: 20 tablet, Rfl: 0   Polyvinyl Alcohol-Povidone (REFRESH OP), Place 1 drop into both eyes 2 (two) times daily as needed (dry eye)., Disp: , Rfl:    valACYclovir (VALTREX) 500 MG tablet, Take 1 tablet (500 mg total) by mouth daily., Disp: 90 tablet, Rfl: 1  Social History   Tobacco Use  Smoking Status Former   Packs/day: 0.50   Years: 35.00   Additional pack years: 0.00   Total pack years: 17.50   Types: Cigarettes   Quit date: 05/22/2022   Years since quitting: 0.2  Smokeless Tobacco Never  Tobacco Comments   "occasional" cigarette    Allergies  Allergen Reactions   Bee Venom Anaphylaxis   Latex Itching   Penicillins     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  There is no height or weight on file to calculate BMI. Constitutional Well developed. Well nourished.  Vascular Dorsalis pedis pulses palpable bilaterally. Posterior tibial pulses palpable bilaterally. Capillary refill normal to all digits.  No cyanosis or clubbing noted. Pedal hair growth normal.  Neurologic Normal speech. Oriented to person, place, and time. Epicritic sensation to light touch grossly present bilaterally.  Dermatologic  dystrophic mycotic nail noted to the left hallux that seems to be slightly more severe than the ones listed below.  Mild pain on palpation. Nail  examination shows thickened elongated dystrophic mycotic nails x10.  Pain on palpation Skin within normal limits  Orthopedic: Normal joint ROM without pain or crepitus bilaterally. No visible deformities. No bony tenderness.   Radiographs: None Assessment:   1. Pain due to onychomycosis of toenails of both feet         Plan:  Patient was evaluated and treated and all questions answered.   Onychomycosis with pain  -Nails palliatively debrided as below. -Educated on self-care  Procedure: Nail Debridement Rationale: pain  Type of Debridement: manual, sharp debridement. Instrumentation: Nail nipper, rotary burr. Number of Nails: 10  Procedures and Treatment: Consent by patient was obtained for treatment procedures. The patient understood the discussion of treatment and procedures well. All questions were answered thoroughly reviewed. Debridement of mycotic and hypertrophic toenails, 1 through 5 bilateral and clearing of subungual debris. No ulceration, no infection noted.  Return Visit-Office Procedure: Patient instructed to return to the office for a follow up visit 3 months for continued evaluation and treatment.  Nicholes Rough, DPM    No follow-ups on file.   No follow-ups on file.

## 2022-08-23 ENCOUNTER — Other Ambulatory Visit: Payer: Self-pay | Admitting: Podiatry

## 2022-09-16 ENCOUNTER — Other Ambulatory Visit: Payer: Self-pay | Admitting: Internal Medicine

## 2022-09-16 DIAGNOSIS — R238 Other skin changes: Secondary | ICD-10-CM

## 2022-09-16 NOTE — Telephone Encounter (Signed)
Refilled: 03/21/2022 Last OV: 06/26/2022 Next OV: 09/26/2022

## 2022-09-23 ENCOUNTER — Other Ambulatory Visit (INDEPENDENT_AMBULATORY_CARE_PROVIDER_SITE_OTHER): Payer: Medicare HMO

## 2022-09-23 DIAGNOSIS — I2583 Coronary atherosclerosis due to lipid rich plaque: Secondary | ICD-10-CM | POA: Diagnosis not present

## 2022-09-23 DIAGNOSIS — R7303 Prediabetes: Secondary | ICD-10-CM | POA: Diagnosis not present

## 2022-09-23 DIAGNOSIS — E0590 Thyrotoxicosis, unspecified without thyrotoxic crisis or storm: Secondary | ICD-10-CM | POA: Diagnosis not present

## 2022-09-23 DIAGNOSIS — I2510 Atherosclerotic heart disease of native coronary artery without angina pectoris: Secondary | ICD-10-CM | POA: Diagnosis not present

## 2022-09-23 DIAGNOSIS — E559 Vitamin D deficiency, unspecified: Secondary | ICD-10-CM

## 2022-09-23 LAB — HEMOGLOBIN A1C: Hgb A1c MFr Bld: 6 % (ref 4.6–6.5)

## 2022-09-23 LAB — COMPREHENSIVE METABOLIC PANEL
ALT: 14 U/L (ref 0–35)
AST: 17 U/L (ref 0–37)
Albumin: 4.2 g/dL (ref 3.5–5.2)
Alkaline Phosphatase: 60 U/L (ref 39–117)
BUN: 16 mg/dL (ref 6–23)
CO2: 26 mEq/L (ref 19–32)
Calcium: 9 mg/dL (ref 8.4–10.5)
Chloride: 105 mEq/L (ref 96–112)
Creatinine, Ser: 1.16 mg/dL (ref 0.40–1.20)
GFR: 46.71 mL/min — ABNORMAL LOW (ref >60.00)
Glucose, Bld: 90 mg/dL (ref 70–99)
Potassium: 4.2 mEq/L (ref 3.5–5.1)
Sodium: 139 mEq/L (ref 135–145)
Total Bilirubin: 0.6 mg/dL (ref 0.2–1.2)
Total Protein: 7.7 g/dL (ref 6.0–8.3)

## 2022-09-23 LAB — VITAMIN D 25 HYDROXY (VIT D DEFICIENCY, FRACTURES): VITD: 55.26 ng/mL (ref 30.00–100.00)

## 2022-09-23 LAB — LIPID PANEL
Cholesterol: 145 mg/dL (ref 0–200)
HDL: 65.3 mg/dL (ref >39.00)
LDL Cholesterol: 61 mg/dL (ref 0–99)
NonHDL: 79.69
Total CHOL/HDL Ratio: 2
Triglycerides: 95 mg/dL (ref 0.0–149.0)
VLDL: 19 mg/dL (ref 0.0–40.0)

## 2022-09-23 LAB — TSH: TSH: 2.14 u[IU]/mL (ref 0.35–5.50)

## 2022-09-23 LAB — LDL CHOLESTEROL, DIRECT: Direct LDL: 66 mg/dL

## 2022-09-26 ENCOUNTER — Ambulatory Visit: Payer: Medicare HMO | Admitting: Internal Medicine

## 2022-10-04 ENCOUNTER — Encounter: Payer: Self-pay | Admitting: Internal Medicine

## 2022-10-04 ENCOUNTER — Ambulatory Visit (INDEPENDENT_AMBULATORY_CARE_PROVIDER_SITE_OTHER): Payer: Medicare HMO | Admitting: Internal Medicine

## 2022-10-04 VITALS — BP 108/76 | HR 72 | Temp 98.3°F | Ht 64.0 in | Wt 135.0 lb

## 2022-10-04 DIAGNOSIS — E0590 Thyrotoxicosis, unspecified without thyrotoxic crisis or storm: Secondary | ICD-10-CM

## 2022-10-04 DIAGNOSIS — I429 Cardiomyopathy, unspecified: Secondary | ICD-10-CM

## 2022-10-04 DIAGNOSIS — Z23 Encounter for immunization: Secondary | ICD-10-CM

## 2022-10-04 DIAGNOSIS — C44609 Unspecified malignant neoplasm of skin of left upper limb, including shoulder: Secondary | ICD-10-CM

## 2022-10-04 DIAGNOSIS — E785 Hyperlipidemia, unspecified: Secondary | ICD-10-CM

## 2022-10-04 DIAGNOSIS — M542 Cervicalgia: Secondary | ICD-10-CM

## 2022-10-04 DIAGNOSIS — R7303 Prediabetes: Secondary | ICD-10-CM | POA: Diagnosis not present

## 2022-10-04 DIAGNOSIS — R350 Frequency of micturition: Secondary | ICD-10-CM

## 2022-10-04 DIAGNOSIS — B0089 Other herpesviral infection: Secondary | ICD-10-CM

## 2022-10-04 DIAGNOSIS — N1831 Chronic kidney disease, stage 3a: Secondary | ICD-10-CM

## 2022-10-04 DIAGNOSIS — I361 Nonrheumatic tricuspid (valve) insufficiency: Secondary | ICD-10-CM

## 2022-10-04 DIAGNOSIS — L989 Disorder of the skin and subcutaneous tissue, unspecified: Secondary | ICD-10-CM

## 2022-10-04 LAB — TSH: TSH: 1.97 u[IU]/mL (ref 0.35–5.50)

## 2022-10-04 MED ORDER — METHIMAZOLE 5 MG PO TABS: 2.5000 mg | ORAL_TABLET | Freq: Every day | ORAL | 1 refills | Status: DC

## 2022-10-04 NOTE — Progress Notes (Unsigned)
Subjective:  Patient ID: Martha Wilson, female    DOB: 1950-09-14  Age: 72 y.o. MRN: [id number]  CC: The primary encounter diagnosis was Prediabetes. 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   HPI Guinevere Kulbacki presents for  Chief Complaint  Patient presents with   Medical Management of Chronic Issues    3 month follow up    She feels generally well,  but has a list of issues she would like to discuss   1)  she has been having left sided neck and shoulder pain and is concerned that this suggests carotid stenosis.  The pain is currently not present and when present does not radiate to either arm or cause weakness,  it does occasionally limit her neck  ROM .   Reviewed her prior cervical spine films and MRI .  She was unaware that she has  degenerative changes and spurring at multiple levels.,  with evidence of spinal stenosis and foraminal stenosis on the right side.  2) Low back pain : chronic;  she is managed with stretching and exercise.    3) CKD:  new diagnosis based on review of GFRs for the past year. She  does not take NSAIDS  more than rarely, but does recall that she  used NSAIDs  every other day for years prior to her  retirement (10 yrs ago)   4) Caregiver stress:  Her Mother, Sharyn Blitz is turning 102 in August.   Party is August 3 12 pm to 2 at   [employer] ROOM    5) Herpes flare finally resolved after 1 month.  Now taking one antiviral daily  at night.  Daytime dosing makes her dizzy . Wants shingrix   6) Has been using a topical "ringworm" cream from [facility] on A  lesion on her left forearm for the past month . The lesion does not bleed, but has been itchy and painful at times    7) Urinary incontinence ./OAB.  Symptoms are Chronic,   mixed stress/urge.  Wears a pad .  Has no no prior trial  of meds due to  concurrent NPH.  Urinates hourly while awake   Outpatient Medications Prior to Visit  Medication Sig Dispense Refill   acetaminophen (TYLENOL) 500 MG tablet Take 1,000 mg by mouth every 6 (six) hours as needed for moderate pain or headache.     aspirin EC 81 MG tablet Take 1 tablet (81 mg total) by mouth daily. Swallow whole. 30 tablet 5   atorvastatin (LIPITOR) 20 MG tablet Take 1 tablet (20 mg total) by mouth daily. 90 tablet 3   calcium carbonate (OSCAL) 1500 (600 Ca) MG TABS tablet Take 1,800 mg by mouth daily with breakfast.     cholecalciferol (VITAMIN D3) 25 MCG (1000 UNIT) tablet Take 2,000 Units by mouth daily.     ciclopirox (PENLAC) 8 % solution APPLY TOPICALLY AT BEDTIME. APPLY OVER NAIL AND SURROUNDING SKIN. APPLY DAILY OVER PREVIOUS COAT. AFTER SEVEN (7) DAYS, MAY REMOVE WITH ALCOHOL AND CONTINUE CYCLE. 6.6 mL 0   Cyanocobalamin (B-12) 2500 MCG TABS Take 2,500 mcg by mouth daily.     cycloSPORINE (RESTASIS) 0.05 % ophthalmic emulsion 1  drop 2 (two) times daily.     Multiple Vitamins-Minerals (PRESERVISION AREDS 2) CAPS Take 1 capsule by mouth 2 (two) times daily.     Polyvinyl Alcohol-Povidone (REFRESH OP) Place 1 drop into both eyes 2 (two) times daily as needed (dry eye).     valACYclovir (VALTREX) 500 MG tablet TAKE 1 TABLET (500 MG TOTAL) BY MOUTH DAILY. 90 tablet 1   methimazole (TAPAZOLE) 5 MG tablet Take 0.5 tablets (2.5 mg total) by mouth at bedtime. 90 tablet 1   ondansetron (ZOFRAN-ODT) 4 MG disintegrating tablet Take 1 tablet (4 mg total) by mouth every 8 (eight) hours as needed for nausea or vomiting. (Patient not taking: Reported on 10/04/2022) 20 tablet 0   No facility-administered medications prior to visit.    Review of  Systems;  Patient denies headache, fevers, malaise, unintentional weight loss, skin rash, eye pain, sinus congestion and sinus pain, sore throat, dysphagia,  hemoptysis , cough, dyspnea, wheezing, chest pain, palpitations, orthopnea, edema, abdominal pain, nausea, melena, diarrhea, constipation, flank pain, dysuria, hematuria,, numbness, tingling, seizures,  Focal weakness, Loss of consciousness,  Tremor, insomnia, depression, anxiety, and suicidal ideation.      Objective:  BP 108/76   Pulse 72   Temp 98.3 F (36.8 C) (Oral)   Ht 5\' 4"  (1.626 m)   Wt 135 lb (61.2 kg)   SpO2 97%   BMI 23.17 kg/m   BP Readings from Last 3 Encounters:  10/04/22 108/76  05/24/22 115/71  03/21/22 118/78    Wt Readings from Last 3 Encounters:  10/04/22 135 lb (61.2 kg)  06/26/22 134 lb (60.8 kg)  03/21/22 134 lb (60.8 kg)    Physical Exam Vitals reviewed.  Constitutional:      General: She is not in acute distress.    Appearance: Normal appearance. She is normal weight. She is not ill-appearing, toxic-appearing or diaphoretic.  HENT:     Head: Normocephalic.  Eyes:     General: No scleral icterus.       Right eye: No discharge.        Left eye: No discharge.     Conjunctiva/sclera: Conjunctivae normal.  Cardiovascular:     Rate and Rhythm: Normal rate and regular rhythm.     Heart sounds: Normal heart sounds.  Pulmonary:     Effort: Pulmonary effort is normal. No respiratory distress.     Breath sounds: Normal breath sounds.  Musculoskeletal:        General: Normal range of motion.  Skin:    General: Skin is warm and dry.     Findings: Lesion present.          Comments: Left proximal forearm with a pink papular lesion without scalling or bleeding c/w basal cell CA   Neurological:     General: No focal deficit present.     Mental Status: She is alert and oriented to person, place, and time. Mental status is at baseline.  Psychiatric:        Mood and Affect: Mood normal.         Behavior: Behavior normal.        Thought Content: Thought content normal.        Judgment: Judgment normal.   Lab Results  Component Value Date   HGBA1C 6.0 09/23/2022   HGBA1C 6.3 03/18/2022   HGBA1C 6.0 08/07/2021    Lab Results  Component Value Date   CREATININE 1.16 09/23/2022   CREATININE 1.10 03/25/2022   CREATININE 1.00 03/18/2022  Lab Results  Component Value Date   WBC 7.0 08/07/2021   HGB 14.9 08/07/2021   HCT 44.2 08/07/2021   PLT 294.0 08/07/2021   GLUCOSE 90 09/23/2022   CHOL 145 09/23/2022   TRIG 95.0 09/23/2022   HDL 65.30 09/23/2022   LDLDIRECT 66.0 09/23/2022   LDLCALC 61 09/23/2022   ALT 14 09/23/2022   AST 17 09/23/2022   NA 139 09/23/2022   K 4.2 09/23/2022   CL 105 09/23/2022   CREATININE 1.16 09/23/2022   BUN 16 09/23/2022   CO2 26 09/23/2022   TSH 1.97 10/04/2022   HGBA1C 6.0 09/23/2022    CT CHEST WO CONTRAST  Result Date: 07/12/2022 CLINICAL DATA:  Lung nodule, quit smoking 1 week ago. EXAM: CT CHEST WITHOUT CONTRAST TECHNIQUE: Multidetector CT imaging of the chest was performed following the standard protocol without IV contrast. RADIATION DOSE REDUCTION: This exam was performed according to the departmental dose-optimization program which includes automated exposure control, adjustment of the mA and/or kV according to patient size and/or use of iterative reconstruction technique. COMPARISON:  07/10/2021. FINDINGS: Cardiovascular: Atherosclerotic calcification of the aorta and coronary arteries. Heart is at the upper limits of normal in size. No pericardial effusion. Mediastinum/Nodes: Thyroid is enlarged. Move No pathologically enlarged mediastinal or axillary lymph nodes. Hilar regions are difficult to definitively evaluate without IV contrast. Esophagus is grossly unremarkable. Lungs/Pleura: Smoking related respiratory bronchiolitis. Similar focal ground-glass in the anterior right lower lobe, 8 x 11 mm (3/84). No solid component. Minimal  scattered pulmonary parenchymal scarring. No new pulmonary nodules. No pleural fluid. Slight tracheal narrowing due to an enlarged thyroid. Airway is otherwise unremarkable. Upper Abdomen: Visualized portions of the liver, gallbladder, adrenal glands, kidneys, spleen, pancreas, stomach and bowel are grossly unremarkable. Shunt catheter is seen in the ventral abdomen. Musculoskeletal: Degenerative changes in the spine. No worrisome lytic or sclerotic lesions. Pectus deformity. IMPRESSION: 1. Stable ground-glass nodule in the right lower lobe. Adenocarcinoma cannot be excluded. This could be followed in 1 year with low-dose lung cancer screening CT. Low-dose CT lung cancer screening is recommended for patients who are 85-70 years of age with a 20+ pack-year history of smoking, and who are currently smoking or quit <=15 years ago. 2. Enlarged thyroid. 3. Aortic atherosclerosis (ICD10-I70.0). Coronary artery calcification. Electronically Signed   By: Leanna Battles M.D.   On: 07/12/2022 08:15    Assessment & Plan:  .Prediabetes -     Comprehensive metabolic panel; Future -     Hemoglobin A1c; Future  Hyperlipidemia, unspecified hyperlipidemia type -     Lipid panel; Future -     LDL cholesterol, direct; Future -     CBC with Differential/Platelet; Future  CKD stage 3a, GFR 45-59 ml/min (HCC) Assessment & Plan: No  prior workup .  She was given the choice of nephrology referral and work or workup prior to nephrology evaluation and chose the latter  etiology may be due to chronic use of NSAIDs,  SPE, IFE, and ultrasound ordered   Orders: -     US RENAL; Future -     Protein electrophoresis, serum -     IFE AND PE, RANDOM URINE  Need for varicella vaccine -     Varicella zoster antibody, IgG; Future  Skin lesion of left arm -     Ambulatory referral to Dermatology  Hyperthyroidism Assessment & Plan: Managed by [facility],  with methimazole 2.5 mg daily . TSH was repeated today and  normal  Lab Results  Component Value Date   TSH 1.97 10/04/2022     Orders: -     TSH  Neck pain without injury Assessment & Plan: Reviewed prior cervical spine MRI .  There is no strength deficit in either arm. . 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    Cardiomyopathy, unspecified type [facility]) Assessment & Plan: She remains asymptomatic with ADLS and light exercise.  EF has normalized by Nov 2023 ECHO; mild TR noted.   . She has nonobstructive CAD    Malignant neoplasm of skin of left upper extremity Assessment & Plan: The lesion on her left forearm does NOT look like fungal.  Referring to Derm for presume basal cell CA    Tricuspid valve insufficiency, non-rheumatic Assessment & Plan: She has no signs of right heart failure on exam and last ECHO was done Nov 2023 and noted  mild TR with no RA/RV enlargement.  Continue follow up with Azucena Cecil   Urinary frequency Assessment & Plan: Likely secondary to NPH and V/P shunt. .  She has been evaluated in the past for UTI    Other orders -     methIMAzole; Take 0.5 tablets (2.5 mg total) by mouth at bedtime.  Dispense: 90 tablet; Refill: 1     I provided 40 minutes of face-to-face time during this encounter reviewing patient's last visit with me, patient's  most recent visit with cardiology,  neurosurgery,  neurology,  recent surgical and non surgical procedures, previous  labs and imaging studies, counseling on currently addressed issues,  and post visit ordering to diagnostics and therapeutics .   Follow-up: Return in about 6 months (around 04/05/2023).   Sherlene Shams, MD

## 2022-10-04 NOTE — Assessment & Plan Note (Addendum)
No  prior workup .  She was given the choice of nephrology referral and work or workup prior to nephrology evaluation and chose the latter  etiology may be due to chronic use of NSAIDs,  SPE, IFE, and ultrasound ordered

## 2022-10-04 NOTE — Patient Instructions (Addendum)
Practice the Kegel exercise every time you sit down to void   Referral to local dermatology for the skin lesion on your left forearm   Neck support pillows for your cervical disk disease  You do not need carotid dopplers

## 2022-10-05 DIAGNOSIS — R350 Frequency of micturition: Secondary | ICD-10-CM | POA: Insufficient documentation

## 2022-10-05 DIAGNOSIS — C44601 Unspecified malignant neoplasm of skin of unspecified upper limb, including shoulder: Secondary | ICD-10-CM | POA: Insufficient documentation

## 2022-10-05 DIAGNOSIS — M542 Cervicalgia: Secondary | ICD-10-CM | POA: Insufficient documentation

## 2022-10-05 NOTE — Assessment & Plan Note (Addendum)
Continue daily suppressive doses of valacyclovir.

## 2022-10-05 NOTE — Assessment & Plan Note (Signed)
Managed by [facility],  with methimazole 2.5 mg daily . TSH was repeated today and normal    Lab Results  Component Value Date   TSH 1.97 10/04/2022

## 2022-10-05 NOTE — Assessment & Plan Note (Signed)
Likely secondary to NPH and V/P shunt. .  She has been evaluated in the past for UTI

## 2022-10-05 NOTE — Assessment & Plan Note (Addendum)
She remains asymptomatic with ADLS and light exercise.  EF has normalized by Nov 2023 ECHO; mild TR noted.   . She has nonobstructive CAD

## 2022-10-05 NOTE — Assessment & Plan Note (Signed)
Reviewed prior cervical spine MRI .  There is no strength deficit in either arm. . Advised to avoid surgery and to use neck support and avoid unbalanced weights on shoulder

## 2022-10-05 NOTE — Assessment & Plan Note (Signed)
She has no signs of right heart failure on exam and last ECHO was done Nov 2023 and noted  mild TR with no RA/RV enlargement.  Continue follow up with Agbor Etang 

## 2022-10-05 NOTE — Assessment & Plan Note (Signed)
The lesion on her left forearm does NOT look like fungal.  Referring to Derm for presume basal cell CA

## 2022-10-08 LAB — PROTEIN ELECTROPHORESIS, SERUM
Albumin ELP: 4.1 g/dL (ref 3.8–4.8)
Alpha 1: 0.3 g/dL (ref 0.2–0.3)
Alpha 2: 0.6 g/dL (ref 0.5–0.9)
Beta 2: 0.5 g/dL (ref 0.2–0.5)
Beta Globulin: 0.5 g/dL (ref 0.4–0.6)
Gamma Globulin: 1.2 g/dL (ref 0.8–1.7)
Total Protein: 7.3 g/dL (ref 6.1–8.1)

## 2022-10-08 LAB — IFE AND PE, RANDOM URINE: ALPHA 1 URINE: 5.6 %

## 2022-10-09 LAB — IFE AND PE, RANDOM URINE
% BETA, Urine: 37.3 %
ALBUMIN, U: 32.6 %
ALPHA-2-GLOBULIN, U: 13.1 %
GAMMA GLOBULIN URINE: 11.3 %
Protein, Ur: 15.5 mg/dL

## 2022-10-10 ENCOUNTER — Encounter: Payer: Self-pay | Admitting: Internal Medicine

## 2022-10-11 ENCOUNTER — Telehealth: Payer: Self-pay | Admitting: *Deleted

## 2022-10-11 NOTE — Telephone Encounter (Signed)
Is it okay for pt to get the shingles vaccine? Per last office note she was being treated for a herpes outbreak.

## 2022-10-11 NOTE — Telephone Encounter (Signed)
$  0 due for Prolia; pt due on or after 11/26/22  Appt scheduled on 11/28/22   PA on Ida Rogue      04/26/22 10:25 AM Note Patient Advocate Encounter   Prior Authorization for [employer] Syr 60MG /ML has been approved.      Effective: 04-25-2022 to 04-26-2023

## 2022-10-16 ENCOUNTER — Ambulatory Visit
Admission: RE | Admit: 2022-10-16 | Discharge: 2022-10-16 | Disposition: A | Discharge status: Home / Self Care | Payer: Medicare HMO | Source: Ambulatory Visit | Attending: Internal Medicine | Admitting: Internal Medicine

## 2022-10-16 DIAGNOSIS — N1831 Chronic kidney disease, stage 3a: Secondary | ICD-10-CM | POA: Insufficient documentation

## 2022-10-17 ENCOUNTER — Encounter: Payer: Self-pay | Admitting: Internal Medicine

## 2022-10-30 ENCOUNTER — Other Ambulatory Visit: Payer: Self-pay

## 2022-10-30 MED ORDER — ATORVASTATIN CALCIUM 20 MG PO TABS: 20.0000 mg | ORAL_TABLET | Freq: Every day | ORAL | 1 refills | Status: DC

## 2022-10-30 NOTE — Telephone Encounter (Signed)
Requested Prescriptions   Signed Prescriptions Disp Refills   atorvastatin (LIPITOR) 20 MG tablet 90 tablet 1    Sig: Take 1 tablet (20 mg total) by mouth daily.    Authorizing Provider: Debbe Odea    Ordering User: Guerry Minors

## 2022-11-04 ENCOUNTER — Telehealth: Payer: Self-pay | Admitting: Neurosurgery

## 2022-11-04 NOTE — Telephone Encounter (Signed)
Thoughts?

## 2022-11-04 NOTE — Telephone Encounter (Signed)
Patient is calling that on Saturday she had these sharp, shooting pain behind her right ear where he did something. It lasted for a few hours until she took aspirin. Then on Sunday she had the pain again. It's like someone is stabbing her with an ice pick. It does go away eventually with Aspirin. Should she be concerned? No other symptoms.

## 2022-11-06 ENCOUNTER — Encounter (INDEPENDENT_AMBULATORY_CARE_PROVIDER_SITE_OTHER): Payer: Self-pay

## 2022-11-11 ENCOUNTER — Encounter: Payer: Self-pay | Admitting: Nurse Practitioner

## 2022-11-11 ENCOUNTER — Telehealth: Payer: Medicare HMO | Admitting: Nurse Practitioner

## 2022-11-11 VITALS — Ht 64.0 in | Wt 135.0 lb

## 2022-11-11 DIAGNOSIS — U071 COVID-19: Secondary | ICD-10-CM | POA: Insufficient documentation

## 2022-11-11 MED ORDER — NIRMATRELVIR/RITONAVIR (PAXLOVID) TABLET (RENAL DOSING)
2.0000 | ORAL_TABLET | Freq: Two times a day (BID) | ORAL | 0 refills | Status: AC
Start: 2022-11-11 — End: 2022-11-16

## 2022-11-11 NOTE — Progress Notes (Signed)
MyChart Video Visit    Virtual Visit via Video Note   This visit type was conducted because this format is felt to be most appropriate for this patient at this time. Physical exam was limited by quality of the video and audio technology used for the visit. CMA was able to get the patient set up on a video visit.  Patient location: Home. Patient and provider in visit Provider location: Office  I discussed the limitations of evaluation and management by telemedicine and the availability of in person appointments. The patient expressed understanding and agreed to proceed.  Visit Date: 11/11/2022  Today's healthcare provider: Bethanie Dicker, NP     Subjective:    Patient ID: Martha Wilson, female    DOB: Nov 23, 1950, 72 y.o.   MRN: [id number]  Chief Complaint  Patient presents with   Covid Positive    Tested Thursday was negative, tested positive today, has a cough, runny nose, hoarse    HPI Patient with exposure to COVID on 11/07/2022. Her symptoms began 11/09/2022. She tested positive for COVID this morning. She is interested in treatment with an anti-viral.  Respiratory illness:  Cough- Yes, productive  Congestion-    Sinus- Yes   Chest- Yes  Post nasal drip- No  Sore throat- No  Shortness of breath- No  Fever- No  Fatigue/Myalgia- Yes Headache- Yes Nausea/Vomiting- No Taste disturbance- No  Smell disturbance- No  Covid exposure- Yes  Covid vaccination- x 3  Flu vaccination- Not due  Medications- Robitussin   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   Tricuspid valve prolapse    Wears  dentures    partial upper and lower    Past Surgical History:  Procedure Laterality Date   APPLICATION OF CRANIAL NAVIGATION N/A 05/31/2020   Procedure: APPLICATION OF CRANIAL NAVIGATION;  Surgeon: Venetia Night, MD;  Location: [facility];  Service: Neurosurgery;  Laterality: N/A;   BREAST BIOPSY  1980's   pt states she had a bx in the 80's, not sure of side, benign   breat biopsy     CATARACT EXTRACTION W/PHACO Right 09/22/2018   Procedure: CATARACT EXTRACTION PHACO AND INTRAOCULAR LENS PLACEMENT (IOC)  RIGHT panooptix;  Surgeon: Galen Manila, MD;  Location: [facility];  Service: Ophthalmology;  Laterality: Right;   CATARACT EXTRACTION W/PHACO Left 10/06/2018   Procedure: CATARACT EXTRACTION PHACO AND INTRAOCULAR LENS PLACEMENT (IOC) LEFT PANOPTIX LENS;  Surgeon: Galen Manila, MD;  Location: [facility];  Service: Ophthalmology;  Laterality: Left;   COLONOSCOPY     COLONOSCOPY WITH PROPOFOL N/A 11/19/2018   Procedure: COLONOSCOPY WITH PROPOFOL;  Surgeon: Wyline Mood, MD;  Location: [facility];  Service: Gastroenterology;  Laterality: N/A;   COLONOSCOPY WITH PROPOFOL N/A 09/21/2021   Procedure: COLONOSCOPY WITH PROPOFOL;  Surgeon: Wyline Mood, MD;  Location: [facility] END[facility];  Service: Gastroenterology;  Laterality: N/A;   ORIF FOOT FRACTURE     VENTRICULOPERITONEAL SHUNT Right 05/31/2020   Procedure: SHUNT INSERTION VENTRICULAR-PERITONEAL;  Surgeon: Venetia Night, MD;  Location: [facility];  Service: Neurosurgery;  Laterality: Right;  Dr Tonna Boehringer to assist    Family History  Problem Relation Age of Onset   Heart attack Father    Thyroid disease Brother    Early death Brother     Social History   Socioeconomic History   Marital status: Single    Spouse name: Not on file   Number of children: Not on file   Years of education: Not on file   Highest education level: Doctorate  Occupational History   Not on file  Tobacco Use   Smoking status: Former     Current packs/day: 0.00    Average packs/day: 0.5 packs/day for 35.0 years (17.5 ttl pk-yrs)    Types: Cigarettes    Start date: 05/23/1987    Quit date: 05/22/2022    Years since quitting: 0.4   Smokeless tobacco: Never   Tobacco comments:    "occasional" cigarette  Vaping Use   Vaping status: Never Used  Substance and Sexual Activity   Alcohol use: Yes    Alcohol/week: 3.0 standard drinks of alcohol    Types: 3 Glasses of wine per week    Comment: OCC   Drug use: Never   Sexual activity: Not Currently  Other Topics Concern   Not on file  Social History Narrative   Not on file   Social Determinants of Health   Financial Resource Strain: Low Risk  (09/23/2022)   Overall Financial Resource Strain (CARDIA)    Difficulty of Paying Living Expenses: Not hard at all  Food Insecurity: No Food Insecurity (09/23/2022)   Hunger Vital Sign    Worried About Running Out of Food in the Last Year: Never true    Ran Out of Food in the Last Year: Never true  Transportation Needs: No Transportation Needs (09/23/2022)   PRAPARE - Administrator, Civil Service (Medical): No    Lack of Transportation (Non-Medical): No  Physical Activity: Insufficiently Active (09/23/2022)   Exercise Vital Sign    Days of Exercise per Week: 3 days    Minutes of Exercise per Session: 20 min  Stress: No Stress Concern Present (09/23/2022)   [employer] of Occupational Health - Occupational Stress Questionnaire    Feeling of Stress : Only a little  Social Connections: Moderately Integrated (09/23/2022)   Social Connection and Isolation Panel [NHANES]    Frequency of Communication with Friends and Family: More than three times a week    Frequency of Social Gatherings with Friends and Family: More than three times a week    Attends Religious Services: More than 4 times per year    Active Member of [employer] or Organizations: Yes    Attends Banker Meetings: Not on file    Marital Status:  Widowed  Intimate Partner Violence: Not At Risk (02/19/2022)   Humiliation, Afraid, Rape, and Kick questionnaire    Fear of Current or Ex-Partner: No    Emotionally Abused: No    Physically Abused: No    Sexually Abused: No    Outpatient Medications Prior to Visit  Medication Sig Dispense Refill   aspirin EC 81 MG tablet Take 1 tablet (81 mg total) by mouth daily. Swallow whole. 30 tablet 5   atorvastatin (LIPITOR) 20 MG tablet Take 1 tablet (20 mg total) by mouth daily. 90 tablet 1   cholecalciferol (VITAMIN D3) 25 MCG (1000 UNIT) tablet Take 2,000 Units by mouth daily.     Cyanocobalamin (B-12) 2500 MCG TABS Take 2,500 mcg by mouth  daily.     methimazole (TAPAZOLE) 5 MG tablet Take 0.5 tablets (2.5 mg total) by mouth at bedtime. 90 tablet 1   valACYclovir (VALTREX) 500 MG tablet TAKE 1 TABLET (500 MG TOTAL) BY MOUTH DAILY. 90 tablet 1   acetaminophen (TYLENOL) 500 MG tablet Take 1,000 mg by mouth every 6 (six) hours as needed for moderate pain or headache.     calcium carbonate (OSCAL) 1500 (600 Ca) MG TABS tablet Take 1,800 mg by mouth daily with breakfast.     ciclopirox (PENLAC) 8 % solution APPLY TOPICALLY AT BEDTIME. APPLY OVER NAIL AND SURROUNDING SKIN. APPLY DAILY OVER PREVIOUS COAT. AFTER SEVEN (7) DAYS, MAY REMOVE WITH ALCOHOL AND CONTINUE CYCLE. (Patient not taking: Reported on 11/11/2022) 6.6 mL 0   cycloSPORINE (RESTASIS) 0.05 % ophthalmic emulsion 1 drop 2 (two) times daily.     Multiple Vitamins-Minerals (PRESERVISION AREDS 2) CAPS Take 1 capsule by mouth 2 (two) times daily.     Polyvinyl Alcohol-Povidone (REFRESH OP) Place 1 drop into both eyes 2 (two) times daily as needed (dry eye).     No facility-administered medications prior to visit.    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   Adhesive [Tape] Rash    Band-aids    ROS See HPI      Objective:    Physical Exam  Ht 5\' 4"  (1.626 m)   Wt 135 lb (61.2 kg)   BMI 23.17 kg/m  Wt Readings from Last 3 Encounters:  11/11/22 135 lb (61.2 kg)  10/04/22 135 lb (61.2 kg)  06/26/22 134 lb (60.8 kg)   GENERAL: alert, oriented, appears well and in no acute distress   HEENT: atraumatic, conjunttiva clear, no obvious abnormalities on inspection of external nose and ears   NECK: normal movements of the head and neck   LUNGS: on inspection no signs of respiratory distress, breathing rate appears normal, no obvious gross SOB, gasping or wheezing   CV: no obvious cyanosis   MS: moves all visible extremities without noticeable abnormality   PSYCH/NEURO: pleasant and cooperative, no obvious depression or anxiety, speech and thought processing grossly intact     Assessment & Plan:   Problem List Items Addressed This Visit       Other   Positive self-administered antigen test for COVID-19 - Primary    On day 3 of symptoms, tested positive this morning. Symptoms have been improving. Will treat with Paxlovid, renal dosing due to GFR being 46.71. She will stop her Atorvastatin while taking the medication and for 5 days after completing it. Counseled on common side effects. She can continue Robitussin as needed for her cough. Encouraged adequate fluid intake. Counseled on quarantine protocol. Return precautions given to patient.       Relevant Medications   nirmatrelvir/ritonavir, renal dosing, (PAXLOVID) 10 x 150 MG & 10 x 100MG  TABS    I am having Martha Wilson start on nirmatrelvir/ritonavir (renal dosing). I am also having her maintain her PreserVision AREDS 2, aspirin EC, B-12, acetaminophen, Polyvinyl Alcohol-Povidone (REFRESH OP), cycloSPORINE, calcium carbonate, cholecalciferol, ciclopirox, valACYclovir, methimazole, and atorvastatin.  Meds ordered this encounter  Medications   nirmatrelvir/ritonavir, renal dosing, (PAXLOVID) 10 x 150 MG & 10 x 100MG  TABS     Sig: Take 2 tablets by mouth 2 (two) times daily for 5 days. (Take nirmatrelvir 150 mg one tablet twice daily  for 5 days and ritonavir 100 mg one tablet twice daily for 5 days) Patient GFR is 46.71    Dispense:  20 tablet    Refill:  0    Order Specific Question:   Supervising Provider    Answer:   Birdie Sons, ERIC G [[id number]]    I discussed the assessment and treatment plan with the patient. The patient was provided an opportunity to ask questions and all were answered. The patient agreed with the plan and demonstrated an understanding of the instructions.   The patient was advised to call back or seek an in-person evaluation if the symptoms worsen or if the condition fails to improve as anticipated.   Bethanie Dicker, NP [facility] [phone] (phone) [phone] (fax)  [facility]

## 2022-11-11 NOTE — Assessment & Plan Note (Signed)
On day 3 of symptoms, tested positive this morning. Symptoms have been improving. Will treat with Paxlovid, renal dosing due to GFR being 46.71. She will stop her Atorvastatin while taking the medication and for 5 days after completing it. Counseled on common side effects. She can continue Robitussin as needed for her cough. Encouraged adequate fluid intake. Counseled on quarantine protocol. Return precautions given to patient.

## 2022-11-19 ENCOUNTER — Ambulatory Visit: Payer: Medicare HMO | Admitting: Podiatry

## 2022-11-19 DIAGNOSIS — M79675 Pain in left toe(s): Secondary | ICD-10-CM | POA: Diagnosis not present

## 2022-11-19 DIAGNOSIS — M79674 Pain in right toe(s): Secondary | ICD-10-CM | POA: Diagnosis not present

## 2022-11-19 DIAGNOSIS — B351 Tinea unguium: Secondary | ICD-10-CM | POA: Diagnosis not present

## 2022-11-19 NOTE — Progress Notes (Signed)
Subjective:  Patient ID: Martha Wilson, female    DOB: 04-24-1950,  MRN: [id number]  Chief Complaint  Patient presents with   Nail Problem    72 y.o. female presents with the above complaint.  Patient presents with thickened elongated dystrophic toenails x10.  Pain to palpation.  They are not able to do with her themselves.  They would like for [place] to debride them down.  She does not have any secondary complaints at this time.  Review of Systems: Negative except as noted in the HPI. Denies N/V/F/Ch.  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 Swallow whole., Disp: 30 tablet, Rfl: 5   atorvastatin (LIPITOR) 20 MG tablet, Take 1 tablet (20 mg total) by mouth daily., Disp: 90 tablet, Rfl: 1   calcium carbonate (OSCAL) 1500 (600 Ca) MG TABS tablet, Take 1,800 mg by mouth daily with breakfast., Disp: , Rfl:    cholecalciferol (VITAMIN D3) 25 MCG (1000 UNIT) tablet, Take 2,000 Units by mouth daily., Disp: , Rfl:    ciclopirox (PENLAC) 8 % solution, APPLY TOPICALLY AT BEDTIME. APPLY OVER NAIL AND SURROUNDING SKIN. APPLY DAILY OVER PREVIOUS COAT. AFTER SEVEN (7) DAYS, MAY REMOVE WITH ALCOHOL AND CONTINUE CYCLE. (Patient not taking: Reported on 11/11/2022), Disp: 6.6 mL, Rfl: 0   Cyanocobalamin (B-12) 2500 MCG  TABS, Take 2,500 mcg by mouth daily., Disp: , Rfl:    cycloSPORINE (RESTASIS) 0.05 % ophthalmic emulsion, 1 drop 2 (two) times daily., Disp: , Rfl:    methimazole (TAPAZOLE) 5 MG tablet, Take 0.5 tablets (2.5 mg total) by mouth at bedtime., Disp: 90 tablet, Rfl: 1   Multiple Vitamins-Minerals (PRESERVISION AREDS 2) CAPS, Take 1 capsule by mouth 2 (two) times daily., Disp: , Rfl:    Polyvinyl Alcohol-Povidone (REFRESH OP), Place 1 drop into both eyes 2 (two) times daily as needed (dry eye)., Disp: , Rfl:    valACYclovir (VALTREX) 500 MG tablet, TAKE 1 TABLET (500 MG TOTAL) BY MOUTH DAILY., Disp: 90 tablet, Rfl: 1  Social History   Tobacco Use  Smoking Status Former   Current packs/day: 0.00   Average packs/day: 0.5 packs/day for 35.0 years (17.5 ttl pk-yrs)   Types: Cigarettes   Start date: 05/23/1987   Quit date: 05/22/2022   Years since quitting: 0.4  Smokeless Tobacco Never  Tobacco Comments   "occasional" cigarette    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  There is no height or weight on file to calculate BMI. Constitutional Well developed. Well nourished.  Vascular Dorsalis pedis pulses palpable bilaterally. Posterior tibial pulses palpable bilaterally. Capillary refill normal to all digits.  No cyanosis or clubbing noted. Pedal hair growth normal.  Neurologic Normal speech. Oriented to person, place, and time. Epicritic sensation to light touch grossly present bilaterally.  Dermatologic  dystrophic mycotic nail noted to the left hallux that seems to be slightly more severe than the ones listed below.  Mild pain on palpation. Nail examination shows thickened elongated dystrophic mycotic nails x10.  Pain on palpation Skin within normal limits   Orthopedic: Normal joint ROM without pain or crepitus bilaterally. No visible deformities. No bony tenderness.   Radiographs: None Assessment:   1. Pain due to onychomycosis of toenails of both feet          Plan:  Patient was evaluated and treated and all questions answered.   Onychomycosis with pain  -Nails palliatively debrided as below. -Educated on self-care  Procedure: Nail Debridement Rationale: pain  Type of Debridement: manual, sharp debridement. Instrumentation: Nail nipper, rotary burr. Number of Nails: 10  Procedures and Treatment: Consent by patient was obtained for treatment procedures. The patient understood the discussion of treatment and procedures well. All questions were answered thoroughly reviewed. Debridement of mycotic and hypertrophic toenails, 1 through 5 bilateral and clearing of subungual debris. No ulceration, no infection noted.  Return Visit-Office Procedure: Patient instructed to return to the office for a follow up visit 3 months for continued evaluation and treatment.  Nicholes Rough, DPM    No follow-ups on file.   No follow-ups on file.

## 2022-11-21 ENCOUNTER — Ambulatory Visit: Payer: Managed Care, Other (non HMO) | Admitting: Podiatry

## 2022-11-25 NOTE — Telephone Encounter (Signed)
I sent her a MyChart message

## 2022-11-25 NOTE — Telephone Encounter (Signed)
Was this taken care of?

## 2022-11-25 NOTE — Telephone Encounter (Signed)
Hey, I'm sorry this must have accidentally gotten closed out of our box. I dont believe any one ever called her, can you notify her?

## 2022-11-25 NOTE — Telephone Encounter (Signed)
Can someone please call her, thanks.

## 2022-11-28 ENCOUNTER — Ambulatory Visit: Payer: Medicare HMO

## 2022-11-28 DIAGNOSIS — M816 Localized osteoporosis [Lequesne]: Secondary | ICD-10-CM | POA: Diagnosis not present

## 2022-11-28 MED ORDER — DENOSUMAB 60 MG/ML ~~LOC~~ SOSY
60.0000 mg | PREFILLED_SYRINGE | Freq: Once | SUBCUTANEOUS | Status: AC
Start: 2022-11-28 — End: 2022-11-28
  Administered 2022-11-28: 60 mg via SUBCUTANEOUS

## 2022-11-28 NOTE — Progress Notes (Signed)
 Patient arrived for a Prolia injection and it was administered into her right arm SQ. Patient tolerated the injection well and did not show any signs of distress or voice any concerns.

## 2022-11-29 ENCOUNTER — Encounter: Payer: Self-pay | Admitting: Pulmonary Disease

## 2022-12-21 ENCOUNTER — Other Ambulatory Visit: Payer: Self-pay | Admitting: Internal Medicine

## 2022-12-21 DIAGNOSIS — R238 Other skin changes: Secondary | ICD-10-CM

## 2022-12-30 ENCOUNTER — Telehealth: Payer: Self-pay | Admitting: Cardiology

## 2022-12-30 ENCOUNTER — Other Ambulatory Visit: Payer: Self-pay | Admitting: Internal Medicine

## 2022-12-30 DIAGNOSIS — Z1231 Encounter for screening mammogram for malignant neoplasm of breast: Secondary | ICD-10-CM

## 2022-12-30 NOTE — Telephone Encounter (Signed)
Patient would like to know if she needs to have anything done prior to 12/20 follow up with Dr. Azucena Wilson. She states she usually has something beforehand, but she does not know what--echo/labs? Please advise.

## 2022-12-31 NOTE — Telephone Encounter (Signed)
Chart reviewed and did not see any testing necessary before her next appointment. She verbalized understanding with no further questions at this time.

## 2023-01-10 ENCOUNTER — Ambulatory Visit: Payer: Medicare HMO

## 2023-01-15 ENCOUNTER — Ambulatory Visit: Admission: RE | Admit: 2023-01-15 | Payer: Medicare HMO | Source: Ambulatory Visit

## 2023-01-17 ENCOUNTER — Ambulatory Visit
Admission: RE | Admit: 2023-01-17 | Discharge: 2023-01-17 | Disposition: A | Discharge status: Home / Self Care | Payer: Medicare HMO | Source: Ambulatory Visit | Attending: Pulmonary Disease | Admitting: Pulmonary Disease

## 2023-01-17 DIAGNOSIS — R911 Solitary pulmonary nodule: Secondary | ICD-10-CM | POA: Diagnosis present

## 2023-02-03 ENCOUNTER — Other Ambulatory Visit: Payer: Self-pay

## 2023-02-03 DIAGNOSIS — R911 Solitary pulmonary nodule: Secondary | ICD-10-CM

## 2023-02-12 ENCOUNTER — Ambulatory Visit
Admission: RE | Admit: 2023-02-12 | Discharge: 2023-02-12 | Disposition: A | Discharge status: Home / Self Care | Payer: Medicare HMO | Source: Ambulatory Visit | Attending: Internal Medicine | Admitting: Internal Medicine

## 2023-02-12 DIAGNOSIS — Z1231 Encounter for screening mammogram for malignant neoplasm of breast: Secondary | ICD-10-CM | POA: Insufficient documentation

## 2023-02-20 ENCOUNTER — Ambulatory Visit: Payer: Medicare HMO | Admitting: Podiatry

## 2023-03-04 ENCOUNTER — Ambulatory Visit: Payer: Medicare HMO | Admitting: Podiatry

## 2023-03-04 ENCOUNTER — Encounter: Payer: Self-pay | Admitting: Podiatry

## 2023-03-04 DIAGNOSIS — M79675 Pain in left toe(s): Secondary | ICD-10-CM

## 2023-03-04 DIAGNOSIS — M79674 Pain in right toe(s): Secondary | ICD-10-CM

## 2023-03-04 DIAGNOSIS — B351 Tinea unguium: Secondary | ICD-10-CM

## 2023-03-04 NOTE — Progress Notes (Signed)
Subjective:  Patient ID: Martha Wilson, female    DOB: 09/25/1950,  MRN: [id number]  Chief Complaint  Patient presents with   Nail Problem    "I'm here about my toe, I think it's ingrown.  I want him to cut my toenails too."    72 y.o. female presents with the above complaint.  Patient presents with thickened elongated dystrophic toenails x10.  Pain to palpation.  They are not able to do with her themselves.  They would like for [place] to debride them down.  She does not have any secondary complaints at this time.  Review of Systems: Negative except as noted in the HPI. Denies N/V/F/Ch.  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 Swallow whole., Disp: 30 tablet, Rfl: 5   atorvastatin (LIPITOR) 20 MG tablet, Take 1 tablet (20 mg total) by mouth daily., Disp: 90 tablet, Rfl: 1   calcium carbonate (OSCAL) 1500 (600 Ca) MG TABS tablet, Take 1,800 mg by mouth daily with breakfast., Disp: , Rfl:    cholecalciferol (VITAMIN D3) 25 MCG (1000 UNIT) tablet, Take 2,000 Units by mouth daily., Disp: , Rfl:    Cyanocobalamin (B-12) 2500 MCG TABS, Take 2,500 mcg by mouth daily., Disp: , Rfl:    cycloSPORINE (RESTASIS) 0.05 % ophthalmic emulsion, 1 drop 2 (two) times daily., Disp: , Rfl:    methimazole (TAPAZOLE) 5 MG  tablet, Take 0.5 tablets (2.5 mg total) by mouth at bedtime., Disp: 90 tablet, Rfl: 1   Multiple Vitamins-Minerals (PRESERVISION AREDS 2) CAPS, Take 1 capsule by mouth 2 (two) times daily., Disp: , Rfl:    Polyvinyl Alcohol-Povidone (REFRESH OP), Place 1 drop into both eyes 2 (two) times daily as needed (dry eye)., Disp: , Rfl:    valACYclovir (VALTREX) 500 MG tablet, TAKE 1 TABLET (500 MG TOTAL) BY MOUTH DAILY., Disp: 90 tablet, Rfl: 1   ciclopirox (PENLAC) 8 % solution, APPLY TOPICALLY AT BEDTIME. APPLY OVER NAIL AND SURROUNDING SKIN. APPLY DAILY OVER PREVIOUS COAT. AFTER SEVEN (7) DAYS, MAY REMOVE WITH ALCOHOL AND CONTINUE CYCLE. 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  There is no height or weight on file to calculate BMI. Constitutional Well developed. Well nourished.  Vascular Dorsalis pedis pulses palpable bilaterally. Posterior tibial pulses palpable bilaterally. Capillary refill normal to all digits.  No cyanosis or clubbing noted. Pedal hair growth normal.  Neurologic Normal speech. Oriented to person, place, and time. Epicritic sensation to light touch grossly present bilaterally.  Dermatologic  dystrophic mycotic nail noted to the left hallux that seems to be slightly more severe than the ones listed below.  Mild pain on palpation. Nail examination shows thickened  elongated dystrophic mycotic nails x10.  Pain on palpation Skin within normal limits  Orthopedic: Normal joint ROM without pain or crepitus bilaterally. No visible deformities. No bony tenderness.   Radiographs: None Assessment:   1. Pain due to onychomycosis of toenails of both feet           Plan:  Patient was evaluated and treated and all questions answered.   Onychomycosis with pain  -Nails palliatively debrided as below. -Educated on self-care  Procedure: Nail Debridement Rationale: pain  Type of Debridement: manual, sharp debridement. Instrumentation: Nail nipper, rotary burr. Number of Nails: 10  Procedures and Treatment: Consent by patient was obtained for treatment procedures. The patient understood the discussion of treatment and procedures well. All questions were answered thoroughly reviewed. Debridement of mycotic and hypertrophic toenails, 1 through 5 bilateral and clearing of subungual debris. No ulceration, no infection noted.  Return Visit-Office Procedure: Patient instructed to return to the office for a follow up visit 3 months for continued evaluation and treatment.  Nicholes Rough, DPM    No follow-ups on file.   No follow-ups on file.

## 2023-03-12 ENCOUNTER — Telehealth: Payer: Self-pay | Admitting: Podiatry

## 2023-03-20 ENCOUNTER — Ambulatory Visit: Payer: Medicare HMO | Admitting: Neurosurgery

## 2023-03-20 ENCOUNTER — Telehealth: Payer: Self-pay | Admitting: Podiatry

## 2023-03-20 NOTE — Telephone Encounter (Signed)
Patient needs prescription refilled and sent to [facility] on [address].

## 2023-03-24 ENCOUNTER — Ambulatory Visit: Payer: Medicare HMO | Admitting: Nurse Practitioner

## 2023-03-24 ENCOUNTER — Telehealth: Payer: Self-pay

## 2023-03-24 ENCOUNTER — Encounter: Payer: Self-pay | Admitting: Nurse Practitioner

## 2023-03-24 VITALS — BP 126/78 | HR 81 | Temp 98.7°F | Ht 64.0 in | Wt 136.6 lb

## 2023-03-24 DIAGNOSIS — R300 Dysuria: Secondary | ICD-10-CM | POA: Diagnosis not present

## 2023-03-24 LAB — POC URINALSYSI DIPSTICK (AUTOMATED)
Bilirubin, UA: NEGATIVE
Glucose, UA: NEGATIVE
Ketones, UA: NEGATIVE
Nitrite, UA: NEGATIVE
Protein, UA: POSITIVE — AB
Spec Grav, UA: 1.015 (ref 1.010–1.025)
Urobilinogen, UA: 0.2 U/dL
pH, UA: 5 (ref 5.0–8.0)

## 2023-03-24 LAB — URINALYSIS, ROUTINE W REFLEX MICROSCOPIC
Bilirubin Urine: NEGATIVE
Ketones, ur: NEGATIVE
Nitrite: NEGATIVE
Specific Gravity, Urine: 1.015 (ref 1.000–1.030)
Total Protein, Urine: 30 — AB
Urine Glucose: NEGATIVE
Urobilinogen, UA: 0.2 (ref 0.0–1.0)
pH: 6 (ref 5.0–8.0)

## 2023-03-24 MED ORDER — NITROFURANTOIN MONOHYD MACRO 100 MG PO CAPS: 100.0000 mg | ORAL_CAPSULE | Freq: Two times a day (BID) | ORAL | 0 refills | Status: DC

## 2023-03-24 NOTE — Telephone Encounter (Signed)
Patient states she believes she may have a UTI.  Patient states she is experiencing frequent urination & pain during urination, blood in urine.  I scheduled an appointment for patient to see Bethanie Dicker today at 2pm and I transferred call to Access Nurse.

## 2023-03-24 NOTE — Progress Notes (Signed)
Bethanie Dicker, NP-C Phone: [phone]  Martha Wilson is a 72 y.o. female who presents today for UTI symptoms.   Discussed the use of AI scribe software for clinical note transcription with the patient, who gave verbal consent to proceed.  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 The symptoms began approximately two days prior, during a flight, when the patient noticed discomfort during urination. The discomfort progressed to a burning sensation, particularly noticeable during a nocturnal episode. The patient reported a significant increase in urinary frequency and urgency, with the presence of bright red blood in the urine, noticed upon wiping. The patient also reported some abdominal pain, which resolved by the morning of the consultation. No fever or abnormal discharge was reported.  The patient has a history of urinary frequency, which she attributes to her underlying condition of normal pressure hydrocephalus. She reported a regular pattern of urination, often hourly in the morning and up to three times at night. However, the current symptoms were notably different and more severe.  The patient also disclosed recent sexual activity after a long period of abstinence, approximately eight years. She expressed concern about the possibility of the symptoms being related to this recent activity. The patient reported no pain during intercourse and had taken measures to clean up afterward. However, she was unsure if she had emptied her bladder post-intercourse, a measure she had read could help prevent UTIs.  The patient is diligent about hydration, consuming significant amounts of water daily, which she believes helps manage her headaches associated with normal pressure hydrocephalus. She noted that the blood in the urine seemed to have reduced after  increasing her water intake following the onset of symptoms.      Social History   Tobacco Use  Smoking Status Former   Current packs/day: 0.00   Average packs/day: 0.5 packs/day for 35.0 years (17.5 ttl pk-yrs)   Types: Cigarettes   Start date: 05/23/1987   Quit date: 05/22/2022   Years since quitting: 0.8  Smokeless Tobacco Never  Tobacco Comments   "occasional" cigarette    Current Outpatient Medications on File Prior to Visit  Medication Sig Dispense Refill   acetaminophen (TYLENOL) 500 MG tablet Take 1,000 mg by mouth every 6 (six) hours as needed for moderate pain or headache.     aspirin EC 81 MG tablet Take 1 tablet (81 mg total) by mouth daily. Swallow whole. 30 tablet 5   atorvastatin (LIPITOR) 20 MG tablet Take 1 tablet (20 mg total) by mouth daily. 90 tablet 1   calcium carbonate (OSCAL) 1500 (600 Ca) MG TABS tablet Take 1,800 mg by mouth daily with breakfast.     cholecalciferol (VITAMIN D3) 25 MCG (1000 UNIT) tablet Take 2,000 Units by mouth daily.     ciclopirox (PENLAC) 8 % solution APPLY TOPICALLY AT BEDTIME. APPLY OVER NAIL AND SURROUNDING SKIN. APPLY DAILY OVER PREVIOUS COAT. AFTER SEVEN (7) DAYS, MAY REMOVE WITH ALCOHOL AND CONTINUE CYCLE. (Patient not taking: Reported on 11/11/2022) 6.6 mL 0   Cyanocobalamin (B-12) 2500 MCG TABS Take 2,500 mcg by mouth daily.     cycloSPORINE (RESTASIS) 0.05 % ophthalmic emulsion 1 drop 2 (two) times daily.     methimazole (TAPAZOLE) 5 MG tablet Take 0.5 tablets (2.5 mg total) by mouth at bedtime. 90 tablet 1   Multiple Vitamins-Minerals (PRESERVISION AREDS 2) CAPS Take 1 capsule by mouth  2 (two) times daily.     Polyvinyl Alcohol-Povidone (REFRESH OP) Place 1 drop into both eyes 2 (two) times daily as needed (dry eye).     valACYclovir (VALTREX) 500 MG tablet TAKE 1 TABLET (500 MG TOTAL) BY MOUTH DAILY. 90 tablet 1   No current facility-administered medications on file prior to visit.    ROS see history of present  illness  Objective  Physical Exam Vitals:   03/24/23 1400  BP: 126/78  Pulse: 81  Temp: 98.7 F (37.1 C)  SpO2: 98%    BP Readings from Last 3 Encounters:  03/24/23 126/78  10/04/22 108/76  05/24/22 115/71   Wt Readings from Last 3 Encounters:  03/24/23 136 lb 9.6 oz (62 kg)  11/11/22 135 lb (61.2 kg)  10/04/22 135 lb (61.2 kg)    Physical Exam Constitutional:      General: She is not in acute distress.    Appearance: Normal appearance.  HENT:     Head: Normocephalic.  Cardiovascular:     Rate and Rhythm: Normal rate and regular rhythm.     Heart sounds: Normal heart sounds.  Pulmonary:     Effort: Pulmonary effort is normal.     Breath sounds: Normal breath sounds.  Abdominal:     General: Abdomen is flat. Bowel sounds are normal.     Palpations: Abdomen is soft.     Tenderness: There is no abdominal tenderness. There is no right CVA tenderness or left CVA tenderness.  Skin:    General: Skin is warm and dry.  Neurological:     General: No focal deficit present.     Mental Status: She is alert.  Psychiatric:        Mood and Affect: Mood normal.        Behavior: Behavior normal.    Assessment/Plan: Please see individual problem list.  Dysuria Assessment & Plan: Presenting with acute onset of dysuria, urgency, frequency, and hematuria, she has no fever or abdominal pain. Her history includes urinary frequency due to normal pressure hydrocephalus, which improved with shunt placement. She reported recent sexual intercourse after a long period of abstinence and noted blood when wiping, though it is unclear if the blood was vaginal or urinary, with no current vaginal bleeding. We will monitor for any further episodes of bleeding and, if vaginal bleeding occurs, further evaluation will be needed. UA in office with large blood and small leukocytes. We will start Macrobid twice daily for 5 days, send urine for microscopic and culture, advise her to maintain hydration and  to urinate after sexual intercourse and notify her of culture results.   Orders: -     POCT Urinalysis Dipstick (Automated) -     Urinalysis, Routine w reflex microscopic -     Urine Culture -     Nitrofurantoin Monohyd Macro; Take 1 capsule (100 mg total) by mouth 2 (two) times daily.  Dispense: 10 capsule; Refill: 0   Return if symptoms worsen or fail to improve.   Bethanie Dicker, NP-C [facility] [employer]

## 2023-03-24 NOTE — Assessment & Plan Note (Addendum)
Presenting with acute onset of dysuria, urgency, frequency, and hematuria, she has no fever or abdominal pain. Her history includes urinary frequency due to normal pressure hydrocephalus, which improved with shunt placement. She reported recent sexual intercourse after a long period of abstinence and noted blood when wiping, though it is unclear if the blood was vaginal or urinary, with no current vaginal bleeding. We will monitor for any further episodes of bleeding and, if vaginal bleeding occurs, further evaluation will be needed. UA in office with large blood and small leukocytes. We will start Macrobid twice daily for 5 days, send urine for microscopic and culture, advise her to maintain hydration and to urinate after sexual intercourse and notify her of culture results.

## 2023-03-24 NOTE — Telephone Encounter (Signed)
noted 

## 2023-03-25 ENCOUNTER — Ambulatory Visit: Payer: Medicare HMO | Admitting: Neurosurgery

## 2023-03-25 LAB — URINE CULTURE
MICRO NUMBER:: 15855034
Result:: NO GROWTH
SPECIMEN QUALITY:: ADEQUATE

## 2023-03-26 ENCOUNTER — Telehealth: Payer: Self-pay

## 2023-03-26 ENCOUNTER — Other Ambulatory Visit: Payer: Self-pay | Admitting: Nurse Practitioner

## 2023-03-26 DIAGNOSIS — R3129 Other microscopic hematuria: Secondary | ICD-10-CM

## 2023-03-26 NOTE — Telephone Encounter (Signed)
VM left asking pt to return call in regards to urine results.

## 2023-03-28 ENCOUNTER — Encounter: Payer: Self-pay | Admitting: Cardiology

## 2023-03-28 ENCOUNTER — Ambulatory Visit: Payer: Medicare HMO | Attending: Cardiology | Admitting: Cardiology

## 2023-03-28 VITALS — BP 108/80 | HR 67 | Ht 65.0 in | Wt 136.8 lb

## 2023-03-28 DIAGNOSIS — R9430 Abnormal result of cardiovascular function study, unspecified: Secondary | ICD-10-CM | POA: Diagnosis not present

## 2023-03-28 DIAGNOSIS — I2510 Atherosclerotic heart disease of native coronary artery without angina pectoris: Secondary | ICD-10-CM

## 2023-03-28 NOTE — Patient Instructions (Signed)
Medication Instructions:   Your physician recommends that you continue on your current medications as directed. Please refer to the Current Medication list given to you today.  *If you need a refill on your cardiac medications before your next appointment, please call your pharmacy*   Lab Work:  None Ordered  If you have labs (blood work) drawn today and your tests are completely normal, you will receive your results only by: MyChart Message (if you have MyChart) OR A paper copy in the mail If you have any lab test that is abnormal or we need to change your treatment, we will call you to review the results.   Testing/Procedures:  None Ordered   Follow-Up: At [facility], you and your health needs are our priority.  As part of our continuing mission to provide you with exceptional heart care, we have created designated Provider Care Teams.  These Care Teams include your primary Cardiologist (physician) and Advanced Practice Providers (APPs -  Physician Assistants and Nurse Practitioners) who all work together to provide you with the care you need, when you need it.  We recommend signing up for the patient portal called "MyChart".  Sign up information is provided on this After Visit Summary.  MyChart is used to connect with patients for Virtual Visits (Telemedicine).  Patients are able to view lab/test results, encounter notes, upcoming appointments, etc.  Non-urgent messages can be sent to your provider as well.   To learn more about what you can do with MyChart, go to [email or web address].    Your next appointment:   1 year(s)  Provider:   You may see Debbe Odea, MD or one of the following Advanced Practice Providers on your designated Care Team:   Nicolasa Ducking, NP Eula Listen, PA-C Cadence Fransico Michael, PA-C Charlsie Quest, NP Carlos Levering, NP

## 2023-03-28 NOTE — Progress Notes (Signed)
Cardiology Office Note:    Date:  03/28/2023   ID:  Martha Wilson, DOB 06-20-1950, MRN [id number]  PCP:  Sherlene Shams, MD  [facility] Cardiologist:  Debbe Odea, MD  [facility] Electrophysiologist:  None   Referring MD: Sherlene Shams, MD   Chief Complaint  Patient presents with   Follow-up    Patient denies new or acute cardiac problems/concerns today.      History of Present Illness:    Martha Wilson is a 72 y.o. 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  Feels well, compliant with aspirin, Lipitor as prescribed.  Has minimal left-sided discomfort when stressed.  Otherwise doing okay.  Planning on traveling to [place] soon.  Prior notes Coronary CTA 04/2021 mild RCA and proximal LAD stenosis. Echo 01/2021, EF 45%, mild to moderate TR left heart cath August 2014 20% proximal LAD lesion, mild diffuse disease in RCA.   Apparently has a history of tricuspid valve prolapse with mild RV dilatation.   Outside echo report 01/2020 showed normal systolic function, EF 63%.  Moderately enlarged RV, RV normal systolic function, RVSP 32 mmHg. Previous cardiologist was in [place].  She still gets yearly echocardiogram for tricuspid valve insufficiency with serial echocardiograms.  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   Tricuspid valve prolapse    Wears dentures    partial upper and lower    Past Surgical History:  Procedure Laterality Date   APPLICATION OF CRANIAL NAVIGATION N/A 05/31/2020    Procedure: APPLICATION OF CRANIAL NAVIGATION;  Surgeon: Venetia Night, MD;  Location: [facility] ORS;  Service: Neurosurgery;  Laterality: N/A;   BREAST BIOPSY  1980's   pt states she had a bx in the 80's, not sure of side, benign   breat biopsy     CATARACT EXTRACTION W/PHACO Right 09/22/2018   Procedure: CATARACT EXTRACTION PHACO AND INTRAOCULAR LENS PLACEMENT (IOC)  RIGHT panooptix;  Surgeon: Galen Manila, MD;  Location: [facility];  Service: Ophthalmology;  Laterality: Right;   CATARACT EXTRACTION W/PHACO Left 10/06/2018   Procedure: CATARACT EXTRACTION PHACO AND INTRAOCULAR LENS PLACEMENT (IOC) LEFT PANOPTIX LENS;  Surgeon: Galen Manila, MD;  Location: [facility];  Service: Ophthalmology;  Laterality: Left;   COLONOSCOPY     COLONOSCOPY WITH PROPOFOL N/A 11/19/2018   Procedure: COLONOSCOPY WITH PROPOFOL;  Surgeon: Wyline Mood, MD;  Location: [facility];  Service: Gastroenterology;  Laterality: N/A;   COLONOSCOPY WITH PROPOFOL N/A 09/21/2021   Procedure: COLONOSCOPY WITH PROPOFOL;  Surgeon: Wyline Mood, MD;  Location: [facility] END[facility];  Service: Gastroenterology;  Laterality: N/A;   ORIF FOOT FRACTURE     VENTRICULOPERITONEAL SHUNT Right 05/31/2020   Procedure: SHUNT INSERTION VENTRICULAR-PERITONEAL;  Surgeon: Venetia Night, MD;  Location: [facility] OR[facility];  Service: Neurosurgery;  Laterality: Right;  Dr Tonna Boehringer to assist    Current Medications: Current Meds  Medication Sig   acetaminophen (TYLENOL) 500 MG tablet Take 1,000 mg by mouth every 6 (six) hours as  needed for moderate pain or headache.   aspirin EC 81 MG tablet Take 1 tablet (81 mg total) by mouth daily. Swallow whole.   atorvastatin (LIPITOR) 20 MG tablet Take 1 tablet (20 mg total) by mouth daily.   calcium carbonate (OSCAL) 1500 (600 Ca) MG TABS tablet Take 1,800 mg by mouth daily with breakfast.   cholecalciferol (VITAMIN D3) 25 MCG (1000 UNIT) tablet Take 2,000 Units by mouth daily.   Cyanocobalamin  (B-12) 2500 MCG TABS Take 2,500 mcg by mouth daily.   cycloSPORINE (RESTASIS) 0.05 % ophthalmic emulsion 1 drop 2 (two) times daily.   methimazole (TAPAZOLE) 5 MG tablet Take 0.5 tablets (2.5 mg total) by mouth at bedtime.   Multiple Vitamins-Minerals (PRESERVISION AREDS 2) CAPS Take 1 capsule by mouth 2 (two) times daily.   nitrofurantoin, macrocrystal-monohydrate, (MACROBID) 100 MG capsule Take 1 capsule (100 mg total) by mouth 2 (two) times daily.   Polyvinyl Alcohol-Povidone (REFRESH OP) Place 1 drop into both eyes 2 (two) times daily as needed (dry eye).   valACYclovir (VALTREX) 500 MG tablet TAKE 1 TABLET (500 MG TOTAL) BY MOUTH DAILY.     Allergies:   Bee venom, Latex, Penicillins, and Adhesive [tape]   Social History   Socioeconomic History   Marital status: Single    Spouse name: Not on file   Number of children: Not on file   Years of education: Not on file   Highest education level: Doctorate  Occupational History   Not on file  Tobacco Use   Smoking status: Former    Current packs/day: 0.00    Average packs/day: 0.5 packs/day for 35.0 years (17.5 ttl pk-yrs)    Types: Cigarettes    Start date: 05/23/1987    Quit date: 05/22/2022    Years since quitting: 0.8   Smokeless tobacco: Never   Tobacco comments:    "occasional" cigarette  Vaping Use   Vaping status: Never Used  Substance and Sexual Activity   Alcohol use: Yes    Alcohol/week: 3.0 standard drinks of alcohol    Types: 3 Glasses of wine per week    Comment: OCC   Drug use: Never   Sexual activity: Not Currently  Other Topics Concern   Not on file  Social History Narrative   Not on file   Social Drivers of Health   Financial Resource Strain: Low Risk  (09/23/2022)   Overall Financial Resource Strain (CARDIA)    Difficulty of Paying Living Expenses: Not hard at all  Food Insecurity: No Food Insecurity (09/23/2022)   Hunger Vital Sign    Worried About Running Out of Food in the Last Year: Never true     Ran Out of Food in the Last Year: Never true  Transportation Needs: No Transportation Needs (09/23/2022)   PRAPARE - Administrator, Civil Service (Medical): No    Lack of Transportation (Non-Medical): No  Physical Activity: Insufficiently Active (09/23/2022)   Exercise Vital Sign    Days of Exercise per Week: 3 days    Minutes of Exercise per Session: 20 min  Stress: No Stress Concern Present (09/23/2022)   [employer] of Occupational Health - Occupational Stress Questionnaire    Feeling of Stress : Only a little  Social Connections: Moderately Integrated (09/23/2022)   Social Connection and Isolation Panel [NHANES]    Frequency of Communication with Friends and Family: More than three times a week    Frequency of Social Gatherings with Friends and Family: More than  three times a week    Attends Religious Services: More than 4 times per year    Active Member of Clubs or Organizations: Yes    Attends Banker Meetings: Not on file    Marital Status: Widowed     Family History: The patient's family history includes Early death in her brother; Heart attack in her father; Thyroid disease in her brother.  ROS:   Please see the history of present illness.     All other systems reviewed and are negative.  EKGs/Labs/Other Studies Reviewed:    The following studies were reviewed today:  EKG Interpretation Date/Time:  Friday March 28 2023 11:30:29 EST Ventricular Rate:  67 PR Interval:  122 QRS Duration:  76 QT Interval:  410 QTC Calculation: 433 R Axis:   187  Text Interpretation: Normal sinus rhythm Low voltage QRS Abnormal ECG Confirmed by Debbe Odea ([id number]) on 03/28/2023 11:39:51 AM    Recent Labs: 09/23/2022: ALT 14; BUN 16; Creatinine, Ser 1.16; Potassium 4.2; Sodium 139 10/04/2022: TSH 1.97  Recent Lipid Panel    Component Value Date/Time   CHOL 145 09/23/2022 0952   TRIG 95.0 09/23/2022 0952   HDL 65.30 09/23/2022 0952   CHOLHDL 2  09/23/2022 0952   VLDL 19.0 09/23/2022 0952   LDLCALC 61 09/23/2022 0952   LDLDIRECT 66.0 09/23/2022 0952     Risk Assessment/Calculations:      Physical Exam:    VS:  BP 108/80 (BP Location: Left Arm, Patient Position: Sitting, Cuff Size: Normal)   Pulse 67   Ht 5\' 5"  (1.651 m)   Wt 136 lb 12.8 oz (62.1 kg)   SpO2 100%   BMI 22.76 kg/m     Wt Readings from Last 3 Encounters:  03/28/23 136 lb 12.8 oz (62.1 kg)  03/24/23 136 lb 9.6 oz (62 kg)  11/11/22 135 lb (61.2 kg)     GEN:  Well nourished, well developed in no acute distress HEENT: Normal NECK: No JVD; No carotid bruits CARDIAC: RRR, no murmurs, rubs, gallops RESPIRATORY:  Clear to auscultation without rales, wheezing or rhonchi  ABDOMEN: Soft, non-tender, non-distended MUSCULOSKELETAL:  No edema; No deformity  SKIN: Warm and dry NEUROLOGIC:  Alert and oriented x 3 PSYCHIATRIC:  Normal affect   ASSESSMENT:    1. Coronary artery disease involving native coronary artery of native heart without angina pectoris   2. Ejection fraction < 50%    PLAN:    In order of problems listed above:  Nonobstructive CAD, mild disease in proximal LAD and mid RCA.  Calcium score 315.  Denies chest pain.  Continue aspirin 81 mg, Lipitor 20 mg daily. History of mildly reduced ejection fraction, EF 45%.  Repeat echo 02/2022 EF normalized 50 to 55%.  Patient is euvolemic, asymptomatic.  No GDMT due to low normal BPs, NPH with VP shunt, dizziness .   Follow-up in 1 year.   Medication Adjustments/Labs and Tests Ordered: Current medicines are reviewed at length with the patient today.  Concerns regarding medicines are outlined above.  Orders Placed This Encounter  Procedures   EKG 12-Lead    No orders of the defined types were placed in this encounter.      Signed, Debbe Odea, MD  03/28/2023 12:18 PM    [facility]

## 2023-03-31 ENCOUNTER — Ambulatory Visit: Payer: Medicare HMO | Admitting: Internal Medicine

## 2023-03-31 ENCOUNTER — Other Ambulatory Visit (INDEPENDENT_AMBULATORY_CARE_PROVIDER_SITE_OTHER): Payer: Medicare HMO

## 2023-03-31 DIAGNOSIS — R3129 Other microscopic hematuria: Secondary | ICD-10-CM

## 2023-03-31 DIAGNOSIS — Z23 Encounter for immunization: Secondary | ICD-10-CM

## 2023-03-31 DIAGNOSIS — E785 Hyperlipidemia, unspecified: Secondary | ICD-10-CM

## 2023-03-31 DIAGNOSIS — R7303 Prediabetes: Secondary | ICD-10-CM | POA: Diagnosis not present

## 2023-03-31 LAB — URINALYSIS, ROUTINE W REFLEX MICROSCOPIC
Bilirubin Urine: NEGATIVE
Ketones, ur: NEGATIVE
Leukocytes,Ua: NEGATIVE
Nitrite: NEGATIVE
Specific Gravity, Urine: 1.025 (ref 1.000–1.030)
Urine Glucose: NEGATIVE
Urobilinogen, UA: 0.2 (ref 0.0–1.0)
pH: 6 (ref 5.0–8.0)

## 2023-03-31 LAB — CBC WITH DIFFERENTIAL/PLATELET
Basophils Absolute: 0.1 10*3/uL (ref 0.0–0.1)
Basophils Relative: 0.9 % (ref 0.0–3.0)
Eosinophils Absolute: 0.1 10*3/uL (ref 0.0–0.7)
Eosinophils Relative: 1.4 % (ref 0.0–5.0)
HCT: 44.1 % (ref 36.0–46.0)
Hemoglobin: 14.8 g/dL (ref 12.0–15.0)
Lymphocytes Relative: 30.9 % (ref 12.0–46.0)
Lymphs Abs: 2.2 10*3/uL (ref 0.7–4.0)
MCHC: 33.6 g/dL (ref 30.0–36.0)
MCV: 109.9 fL — ABNORMAL HIGH (ref 78.0–100.0)
Monocytes Absolute: 0.6 10*3/uL (ref 0.1–1.0)
Monocytes Relative: 8.6 % (ref 3.0–12.0)
Neutro Abs: 4.2 10*3/uL (ref 1.4–7.7)
Neutrophils Relative %: 58.2 % (ref 43.0–77.0)
Platelets: 299 10*3/uL (ref 150.0–400.0)
RBC: 4.01 Mil/uL (ref 3.87–5.11)
RDW: 13.6 % (ref 11.5–15.5)
WBC: 7.2 10*3/uL (ref 4.0–10.5)

## 2023-03-31 LAB — COMPREHENSIVE METABOLIC PANEL
ALT: 18 U/L (ref 0–35)
AST: 20 U/L (ref 0–37)
Albumin: 4 g/dL (ref 3.5–5.2)
Alkaline Phosphatase: 63 U/L (ref 39–117)
BUN: 11 mg/dL (ref 6–23)
CO2: 24 meq/L (ref 19–32)
Calcium: 8.3 mg/dL — ABNORMAL LOW (ref 8.4–10.5)
Chloride: 107 meq/L (ref 96–112)
Creatinine, Ser: 0.97 mg/dL (ref 0.40–1.20)
GFR: 57.68 mL/min — ABNORMAL LOW (ref >60.00)
Glucose, Bld: 100 mg/dL — ABNORMAL HIGH (ref 70–99)
Potassium: 4.4 meq/L (ref 3.5–5.1)
Sodium: 138 meq/L (ref 135–145)
Total Bilirubin: 0.5 mg/dL (ref 0.2–1.2)
Total Protein: 7 g/dL (ref 6.0–8.3)

## 2023-03-31 LAB — LIPID PANEL
Cholesterol: 130 mg/dL (ref 0–200)
HDL: 60.3 mg/dL (ref >39.00)
LDL Cholesterol: 48 mg/dL (ref 0–99)
NonHDL: 69.27
Total CHOL/HDL Ratio: 2
Triglycerides: 108 mg/dL (ref 0.0–149.0)
VLDL: 21.6 mg/dL (ref 0.0–40.0)

## 2023-03-31 LAB — HEMOGLOBIN A1C: Hgb A1c MFr Bld: 6.1 % (ref 4.6–6.5)

## 2023-03-31 LAB — LDL CHOLESTEROL, DIRECT: Direct LDL: 51 mg/dL

## 2023-04-01 LAB — VARICELLA ZOSTER ANTIBODY, IGG: Varicella IgG: 26.2 {s_co_ratio}

## 2023-04-02 ENCOUNTER — Encounter: Payer: Self-pay | Admitting: Internal Medicine

## 2023-04-07 ENCOUNTER — Encounter: Payer: Self-pay | Admitting: *Deleted

## 2023-04-07 ENCOUNTER — Ambulatory Visit: Payer: Medicare HMO | Admitting: Internal Medicine

## 2023-04-07 MED ORDER — DENOSUMAB 60 MG/ML ~~LOC~~ SOSY
60.0000 mg | PREFILLED_SYRINGE | SUBCUTANEOUS | Status: AC
  Administered 2023-06-02: 60 mg via SUBCUTANEOUS

## 2023-04-07 NOTE — Addendum Note (Signed)
Addended by: Warden Fillers on: 04/07/2023 01:11 PM   Modules accepted: Orders

## 2023-04-10 ENCOUNTER — Telehealth: Payer: Self-pay

## 2023-04-10 NOTE — Telephone Encounter (Signed)
 Martha Wilson

## 2023-04-11 NOTE — Telephone Encounter (Signed)
 Pharmacy Patient Advocate Encounter   Received notification from  [employer] Portal that prior authorization for PROLIA  is required/requested.   Insurance verification completed.   The patient is insured through [employer] .   Per test claim: PA required; PA submitted to above mentioned insurance via Availity Key/confirmation #/EOC [id number] Status is pending

## 2023-04-16 ENCOUNTER — Other Ambulatory Visit (HOSPITAL_COMMUNITY): Payer: Self-pay

## 2023-04-16 NOTE — Telephone Encounter (Signed)
 Pt ready for scheduling for PROLIA  on or after : 05/28/23  Out-of-pocket cost due at time of visit: $0  Number of injection/visits approved: 2  Primary: [employer] Prolia  co-insurance: 0% Admin fee co-insurance: 0%  Secondary: --- Prolia  co-insurance:  Admin fee co-insurance:   Medical Benefit Details: Date Benefits were checked: 04/09/23 Deductible: NO/ Coinsurance: 0%/ Admin Fee: 0%  Prior Auth: APPROVED PA# [id number] Expiration Date: 04/27/23-04/26/24  # of doses approved: 2  Pharmacy benefit: Copay $--- If patient wants fill through the pharmacy benefit please send prescription to:  --- , and include estimated need by date in rx notes. Pharmacy will ship medication directly to the office.  Patient NOT eligible for Prolia  Copay Card. Copay Card can make patient's cost as little as $25. Link to apply: [email or web address]://www.amgensupportplus.[email or web address]/copay  ** This summary of benefits is an estimation of the patient's out-of-pocket cost. Exact cost may very based on individual plan coverage.

## 2023-04-16 NOTE — Telephone Encounter (Signed)
 Pharmacy Patient Advocate Encounter  Received notification from [employer] that Prior Authorization for PROLIA has been APPROVED from 04/27/23 to 04/26/24   PA #/Case ID/Reference #: [id number]

## 2023-04-21 ENCOUNTER — Ambulatory Visit: Payer: Medicare HMO | Admitting: Internal Medicine

## 2023-04-21 ENCOUNTER — Encounter: Payer: Self-pay | Admitting: Internal Medicine

## 2023-04-21 VITALS — BP 104/66 | HR 93 | Ht 65.0 in | Wt 132.0 lb

## 2023-04-21 DIAGNOSIS — M816 Localized osteoporosis [Lequesne]: Secondary | ICD-10-CM

## 2023-04-21 DIAGNOSIS — R2689 Other abnormalities of gait and mobility: Secondary | ICD-10-CM | POA: Diagnosis not present

## 2023-04-21 DIAGNOSIS — J069 Acute upper respiratory infection, unspecified: Secondary | ICD-10-CM

## 2023-04-21 DIAGNOSIS — I429 Cardiomyopathy, unspecified: Secondary | ICD-10-CM

## 2023-04-21 DIAGNOSIS — R051 Acute cough: Secondary | ICD-10-CM | POA: Diagnosis not present

## 2023-04-21 DIAGNOSIS — J22 Unspecified acute lower respiratory infection: Secondary | ICD-10-CM | POA: Diagnosis not present

## 2023-04-21 DIAGNOSIS — N1831 Chronic kidney disease, stage 3a: Secondary | ICD-10-CM | POA: Diagnosis not present

## 2023-04-21 DIAGNOSIS — D7589 Other specified diseases of blood and blood-forming organs: Secondary | ICD-10-CM

## 2023-04-21 DIAGNOSIS — N3946 Mixed incontinence: Secondary | ICD-10-CM

## 2023-04-21 DIAGNOSIS — I700 Atherosclerosis of aorta: Secondary | ICD-10-CM

## 2023-04-21 DIAGNOSIS — R300 Dysuria: Secondary | ICD-10-CM

## 2023-04-21 DIAGNOSIS — N952 Postmenopausal atrophic vaginitis: Secondary | ICD-10-CM

## 2023-04-21 DIAGNOSIS — R238 Other skin changes: Secondary | ICD-10-CM | POA: Diagnosis not present

## 2023-04-21 LAB — POC COVID19 BINAXNOW: SARS Coronavirus 2 Ag: NEGATIVE

## 2023-04-21 LAB — POCT INFLUENZA A/B
Influenza A, POC: NEGATIVE
Influenza B, POC: NEGATIVE

## 2023-04-21 MED ORDER — PREDNISONE 10 MG PO TABS: ORAL_TABLET | ORAL | 0 refills | Status: DC

## 2023-04-21 MED ORDER — BENZONATATE 100 MG PO CAPS: 100.0000 mg | ORAL_CAPSULE | Freq: Two times a day (BID) | ORAL | 0 refills | Status: DC | PRN

## 2023-04-21 MED ORDER — VALACYCLOVIR HCL 500 MG PO TABS: 500.0000 mg | ORAL_TABLET | Freq: Every day | ORAL | 1 refills | Status: DC

## 2023-04-21 MED ORDER — ATORVASTATIN CALCIUM 20 MG PO TABS: 20.0000 mg | ORAL_TABLET | Freq: Every day | ORAL | 1 refills | Status: DC

## 2023-04-21 MED ORDER — LEVOFLOXACIN 500 MG PO TABS: 500.0000 mg | ORAL_TABLET | Freq: Every day | ORAL | 0 refills | Status: AC

## 2023-04-21 MED ORDER — ESTRADIOL 10 MCG VA TABS: ORAL_TABLET | VAGINAL | 0 refills | Status: DC

## 2023-04-21 MED ORDER — METHIMAZOLE 5 MG PO TABS: 2.5000 mg | ORAL_TABLET | Freq: Every day | ORAL | 1 refills | Status: DC

## 2023-04-21 NOTE — Progress Notes (Addendum)
 Subjective:  Patient ID: Martha Wilson, female    DOB: 04/03/1951  Age: 73 y.o. MRN: [id number]  CC: The primary encounter diagnosis was Viral lower respiratory tract infection. Diagnoses of Vesicular rash, Acute cough, CKD stage 3a, GFR 45-59 ml/min (HCC), Balance disorder, Macrocytosis, Cardiomyopathy, unspecified type (HCC), Localized osteoporosis without current pathological fracture, Urge and stress incontinence, Dysuria, Post-menopause atrophic vaginitis, and Aortic atherosclerosis (HCC) were also pertinent to this visit.   HPI [name] presents for  Chief Complaint  Patient presents with   Medical Management of Chronic Issues    6 month follow up     1) respiratory infection : rhinitis, left ear pain has been  severe since returning by plane last night , cough,  sinus pressure after returning celebreating New Years EVe in [place] .  Symptoms started 4-5 days ago.   Has had some loose stools but just once daily   2) NPH:  sees CY tomorrow for follow up  . Denies worsening balance,  recurrent headaches   3) aortic atherosclerosis / CAD:  taking atorvastatin    4) DJD Knees;  pain and mobility improving,  she was able to walk for long distances during trip to [facility] wants PT to help her get up and down the stairs due to fear of knee giving out. 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  Wearing a pad and timed voids.    Outpatient Medications Prior to Visit  Medication Sig Dispense Refill   acetaminophen  (TYLENOL ) 500 MG tablet Take 1,000 mg by mouth every 6 (six) hours as needed for moderate pain or headache.     aspirin  EC 81 MG tablet Take 1 tablet (81 mg total) by mouth daily. Swallow whole. 30 tablet 5   calcium  carbonate (OSCAL) 1500 (600 Ca) MG TABS tablet Take 1,800 mg by mouth daily  with breakfast.     cholecalciferol (VITAMIN D3) 25 MCG (1000 UNIT) tablet Take 2,000 Units by mouth daily.     ciclopirox  (PENLAC ) 8 % solution APPLY TOPICALLY AT BEDTIME. APPLY OVER NAIL AND SURROUNDING SKIN. APPLY DAILY OVER PREVIOUS COAT. AFTER SEVEN (7) DAYS, MAY REMOVE WITH ALCOHOL AND CONTINUE CYCLE. 6.6 mL 0   Cyanocobalamin  (B-12) 2500 MCG TABS Take 2,500 mcg by mouth daily.     cycloSPORINE (RESTASIS) 0.05 % ophthalmic emulsion 1 drop 2 (two) times daily.     fluticasone (FLONASE) 50 MCG/ACT nasal spray Place 2 sprays into both nostrils daily.     Multiple Vitamins-Minerals (PRESERVISION AREDS 2) CAPS Take 1 capsule by mouth 2 (two) times daily.     Polyvinyl Alcohol-Povidone (REFRESH OP) Place 1 drop into both eyes 2 (two) times daily as needed (dry eye).     atorvastatin  (LIPITOR) 20 MG tablet Take 1 tablet (20 mg total) by mouth daily. 90 tablet 1   methimazole  (TAPAZOLE ) 5 MG tablet Take 0.5 tablets (2.5 mg total) by mouth at bedtime. 90 tablet 1   nitrofurantoin , macrocrystal-monohydrate, (MACROBID ) 100 MG capsule Take 1 capsule (100 mg total) by mouth 2 (two) times daily. (Patient not taking: Reported on 04/22/2023) 10 capsule 0   valACYclovir  (VALTREX ) 500 MG tablet TAKE 1 TABLET (500 MG TOTAL) BY MOUTH DAILY. 90 tablet 1   Facility-Administered Medications Prior to Visit  Medication Dose Route Frequency Provider Last  Rate Last Admin   denosumab  (PROLIA ) injection 60 mg  60 mg Subcutaneous Q6 months Candelaria Pies L, MD   60 mg at 06/02/23 9049    Review of Systems;  Patient denies headache, fevers, malaise, unintentional weight loss, skin rash, eye pain, sinus congestion and sinus pain, sore throat, dysphagia,  hemoptysis , cough, dyspnea, wheezing, chest pain, palpitations, orthopnea, edema, abdominal pain, nausea, melena, diarrhea, constipation, flank pain, dysuria, hematuria, urinary  Frequency, nocturia, numbness, tingling, seizures,  Focal weakness, Loss of consciousness,   Tremor, insomnia, depression, anxiety, and suicidal ideation.      Objective:  BP 104/66   Pulse 93   Ht 5' 5 (1.651 m)   Wt 132 lb (59.9 kg)   SpO2 95%   BMI 21.97 kg/m   BP Readings from Last 3 Encounters:  04/22/23 110/72  04/21/23 104/66  03/28/23 108/80    Wt Readings from Last 3 Encounters:  04/22/23 132 lb (59.9 kg)  04/21/23 132 lb (59.9 kg)  03/28/23 136 lb 12.8 oz (62.1 kg)    Physical Exam Vitals reviewed.  Constitutional:      General: She is not in acute distress.    Appearance: Normal appearance. She is normal weight. She is not ill-appearing, toxic-appearing or diaphoretic.  HENT:     Head: Normocephalic.  Eyes:     General: No scleral icterus.       Right eye: No discharge.        Left eye: No discharge.     Conjunctiva/sclera: Conjunctivae normal.  Cardiovascular:     Rate and Rhythm: Normal rate and regular rhythm.     Heart sounds: Normal heart sounds.  Pulmonary:     Effort: Pulmonary effort is normal. No respiratory distress.     Breath sounds: Normal breath sounds.  Musculoskeletal:        General: Normal range of motion.  Skin:    General: Skin is warm and dry.  Neurological:     General: No focal deficit present.     Mental Status: She is alert and oriented to person, place, and time. Mental status is at baseline.  Psychiatric:        Mood and Affect: Mood normal.        Behavior: Behavior normal.        Thought Content: Thought content normal.        Judgment: Judgment normal.    Lab Results  Component Value Date   HGBA1C 6.1 03/31/2023   HGBA1C 6.0 09/23/2022   HGBA1C 6.3 03/18/2022    Lab Results  Component Value Date   CREATININE 0.97 03/31/2023   CREATININE 1.16 09/23/2022   CREATININE 1.10 03/25/2022    Lab Results  Component Value Date   WBC 7.2 03/31/2023   HGB 14.8 03/31/2023   HCT 44.1 03/31/2023   PLT 299.0 03/31/2023   GLUCOSE 100 (H) 03/31/2023   CHOL 130 03/31/2023   TRIG 108.0 03/31/2023   HDL  60.30 03/31/2023   LDLDIRECT 51.0 03/31/2023   LDLCALC 48 03/31/2023   ALT 18 03/31/2023   AST 20 03/31/2023   NA 138 03/31/2023   K 4.4 03/31/2023   CL 107 03/31/2023   CREATININE 0.97 03/31/2023   BUN 11 03/31/2023   CO2 24 03/31/2023   TSH 1.97 10/04/2022   HGBA1C 6.1 03/31/2023    MM 3D SCREENING MAMMOGRAM BILATERAL BREAST Result Date: 02/14/2023 CLINICAL DATA:  Screening. EXAM: DIGITAL SCREENING BILATERAL MAMMOGRAM WITH TOMOSYNTHESIS AND CAD TECHNIQUE: Bilateral screening digital  craniocaudal and mediolateral oblique mammograms were obtained. Bilateral screening digital breast tomosynthesis was performed. The images were evaluated with computer-aided detection. COMPARISON:  Previous exam(s). ACR Breast Density Category c: The breasts are heterogeneously dense, which may obscure small masses. FINDINGS: There are no findings suspicious for malignancy. IMPRESSION: No mammographic evidence of malignancy. A result letter of this screening mammogram will be mailed directly to the patient. RECOMMENDATION: Screening mammogram in one year. (Code:[id number]-B-01Y) BI-RADS CATEGORY  1: Negative. Electronically Signed   By: Corean Salter M.D.   On: 02/14/2023 14:51    Assessment & Plan:  .Viral lower respiratory tract infection Assessment & Plan: POC influenza and COVID negative.  RSV panel ordered, prednisone , benzonatate  ordered for cough suppression and advised to start abx if symtpoms escalate to include otalgia or sinusitis   Orders: -     Respiratory virus panel  Vesicular rash -     valACYclovir  HCl; Take 1 tablet (500 mg total) by mouth daily.  Dispense: 90 tablet; Refill: 1  Acute cough -     POC COVID-19 BinaxNow -     POCT Influenza A/B  CKD stage 3a, GFR 45-59 ml/min (HCC) Assessment & Plan: GFR has improved.  She has deferred referral to nephrology for workup.  She has discontinued  use of NSAIDs,  SPE, IFE, and ultrasound ordered and normal. GFR has improved  Lab Results   Component Value Date   CREATININE 0.97 03/31/2023      Balance disorder Assessment & Plan: Secondary to NPH. Balance has improved s/p placement of v/p shunt by Reeves Daisy In 2022    Macrocytosis Assessment & Plan: Checking b12 and folate levels.  Not anemic  Orders: -     B12 and Folate Panel; Future  Cardiomyopathy, unspecified type [facility]) Assessment & Plan: She remains asymptomatic with ADLS and light exercise.  EF has normalized by Nov 2023 ECHO; mild TR noted.   . She has nonobstructive CAD and is tolerating statin therapy   Localized osteoporosis without current pathological fracture Assessment & Plan: She is tolerating  Prolia  injection started in Jan 2024,  continue  every 6 months    Urge and stress incontinence Assessment & Plan: Improved with treatment of NPH, mnaged with use of light pads.    Dysuria Assessment & Plan: UTI ruled out,  post menopausal vaginal agrophy aggrvated by recent sexual encounter.  Advised to start vaginal estrogen,  tablets ordered    Post-menopause atrophic vaginitis Assessment & Plan: Estrace  vaginal suppositories added    Aortic atherosclerosis (HCC) Assessment & Plan: Reviewed findings of prior CT scan today..  Patient is tolerating statin therapy   Lab Results  Component Value Date   CHOL 130 03/31/2023   HDL 60.30 03/31/2023   LDLCALC 48 03/31/2023   LDLDIRECT 51.0 03/31/2023   TRIG 108.0 03/31/2023   CHOLHDL 2 03/31/2023      Other orders -     Atorvastatin  Calcium ; Take 1 tablet (20 mg total) by mouth daily.  Dispense: 90 tablet; Refill: 1 -     methIMAzole ; Take 0.5 tablets (2.5 mg total) by mouth at bedtime.  Dispense: 90 tablet; Refill: 1 -     levoFLOXacin ; Take 1 tablet (500 mg total) by mouth daily for 7 days.  Dispense: 7 tablet; Refill: 0 -     predniSONE ; 6 tablets on Day 1 , then reduce by 1 tablet daily until gone  Dispense: 21 tablet; Refill: 0 -     Benzonatate ; Take 1 capsule (100  mg total)  by mouth 2 (two) times daily as needed for cough.  Dispense: 20 capsule; Refill: 0     I provided  42  minutes of face-to-face time during this encounter reviewing patient's last visit with me, patient's  most recent visit with cardiology,  neurosurgery, recent surgical and non surgical procedures, previous  labs and imaging studies, counseling on currently addressed issues,  and post visit ordering to diagnostics and therapeutics .   Follow-up: Return in about 6 months (around 10/19/2023).   Verneita LITTIE Kettering, MD

## 2023-04-21 NOTE — Assessment & Plan Note (Signed)
Secondary to NPH. Balance has improved s/p placement of v/p shunt by Meade Maw In 2022

## 2023-04-21 NOTE — Patient Instructions (Addendum)
 Your flu and COVD tests were negative.  The other virus panel will result   in 2 days  You have early  otitis media because of sinus congestion/infection   I recommend a round of antibiotics to make sure the otitis does not progress,  Along with a  Shot of prednisone  to deal with the inflammation.  You can add Afrin every 12 hours for a maximum of 5 days for the nasal and ear congestion.    You can use 25 mg benadryl at bedtime to help dry up sinuses  Daily use of Probiotics is advised  for  3 weeks advised to reduce risk of C dificile colitis.  This includes yogurt,  Especially Activia one serving daily     Generic afrin should be part of your travel bag before any flights when you have sinus congestion

## 2023-04-21 NOTE — Assessment & Plan Note (Addendum)
 GFR has improved.  She has deferred referral to nephrology for workup.  She has discontinued  use of NSAIDs,  SPE, IFE, and ultrasound ordered and normal. GFR has improved  Lab Results  Component Value Date   CREATININE 0.97 03/31/2023

## 2023-04-22 ENCOUNTER — Other Ambulatory Visit (INDEPENDENT_AMBULATORY_CARE_PROVIDER_SITE_OTHER): Payer: Medicare HMO

## 2023-04-22 ENCOUNTER — Telehealth: Payer: Self-pay

## 2023-04-22 ENCOUNTER — Ambulatory Visit: Payer: Medicare HMO | Admitting: Neurosurgery

## 2023-04-22 VITALS — BP 110/72 | Ht 65.0 in | Wt 132.0 lb

## 2023-04-22 DIAGNOSIS — I700 Atherosclerosis of aorta: Secondary | ICD-10-CM | POA: Insufficient documentation

## 2023-04-22 DIAGNOSIS — G912 (Idiopathic) normal pressure hydrocephalus: Secondary | ICD-10-CM | POA: Diagnosis not present

## 2023-04-22 DIAGNOSIS — D7589 Other specified diseases of blood and blood-forming organs: Secondary | ICD-10-CM | POA: Insufficient documentation

## 2023-04-22 DIAGNOSIS — N952 Postmenopausal atrophic vaginitis: Secondary | ICD-10-CM | POA: Insufficient documentation

## 2023-04-22 DIAGNOSIS — J069 Acute upper respiratory infection, unspecified: Secondary | ICD-10-CM | POA: Insufficient documentation

## 2023-04-22 LAB — B12 AND FOLATE PANEL
Folate: 17.9 ng/mL (ref >5.9)
Vitamin B-12: >1537 pg/mL — ABNORMAL HIGH (ref 211–911)

## 2023-04-22 NOTE — Assessment & Plan Note (Signed)
 Checking b12 and folate levels.  Not anemic

## 2023-04-22 NOTE — Telephone Encounter (Signed)
 I have placed in red folder for you to review.

## 2023-04-22 NOTE — Assessment & Plan Note (Signed)
 UTI ruled out,  post menopausal vaginal agrophy aggrvated by recent sexual encounter.  Advised to start vaginal estrogen,  tablets ordered

## 2023-04-22 NOTE — Telephone Encounter (Signed)
 Patient dropped off paperwork from her pharmacy for Dr. Duncan Dull to review.  I placed paperwork in Dr. Melina Schools color folder up front.

## 2023-04-22 NOTE — Assessment & Plan Note (Addendum)
 POC influenza and COVID negative.  RSV panel ordered, prednisone, benzonatate ordered for cough suppression and advised to start abx if symtpoms escalate to include otalgia or sinusitis

## 2023-04-22 NOTE — Assessment & Plan Note (Signed)
 Improved with treatment of NPH, mnaged with use of light pads.

## 2023-04-22 NOTE — Assessment & Plan Note (Signed)
 Reviewed findings of prior CT scan today..  Patient is tolerating statin therapy   Lab Results  Component Value Date   CHOL 130 03/31/2023   HDL 60.30 03/31/2023   LDLCALC 48 03/31/2023   LDLDIRECT 51.0 03/31/2023   TRIG 108.0 03/31/2023   CHOLHDL 2 03/31/2023

## 2023-04-22 NOTE — Progress Notes (Signed)
    DOS: 05/31/20 - R VPS Codman Hakim  HISTORY OF PRESENT ILLNESS:   04/22/2023 Martha Wilson is doing very well.  She still has some whooshing in her ears but is otherwise well.  03/21/2022  Martha Wilson is status post right ventriculoperitoneal shunt placement. she is doing well.     PHYSICAL EXAMINATION: Vitals:   04/22/23 1151  BP: 110/72    General: Patient is well developed, well nourished, calm, collected, and in no apparent distress.  NEUROLOGICAL: General: In no acute distress. Awake, alert, oriented to person, place, and time. Pupils equal round and reactive to light. Facial tone is symmetric. Tongue protrusion is midline. There is no pronator drift.  Strength: 5/5 t/o  ROS (Neurologic): Negative except as noted above  MEDICAL DECISION-MAKING:  ASSESSMENT/PLAN: Martha Wilson is doing well after VPS.  She is currently stable.  I will see her back in a year.    I spent a total of 10 minutes in both face-to-face and non-face-to-face activities for this visit on the date of this encounter.  Advised to contact the office if any questions or concerns arise.  Alexsandro Salek K. Clois MD, MPHS

## 2023-04-22 NOTE — Assessment & Plan Note (Signed)
 She is tolerating  Prolia injection started in Jan 2024,  continue  every 6 months

## 2023-04-22 NOTE — Assessment & Plan Note (Addendum)
 She remains asymptomatic with ADLS and light exercise.  EF has normalized by Nov 2023 ECHO; mild TR noted.   . She has nonobstructive CAD and is tolerating statin therapy

## 2023-04-22 NOTE — Assessment & Plan Note (Signed)
 Estrace vaginal suppositories added

## 2023-04-23 LAB — RESPIRATORY VIRUS PANEL

## 2023-04-24 ENCOUNTER — Encounter: Payer: Self-pay | Admitting: Internal Medicine

## 2023-05-08 ENCOUNTER — Other Ambulatory Visit: Payer: Self-pay | Admitting: Internal Medicine

## 2023-05-29 ENCOUNTER — Ambulatory Visit: Payer: Medicare HMO | Admitting: Podiatry

## 2023-05-29 DIAGNOSIS — M79675 Pain in left toe(s): Secondary | ICD-10-CM | POA: Diagnosis not present

## 2023-05-29 DIAGNOSIS — B351 Tinea unguium: Secondary | ICD-10-CM

## 2023-05-29 DIAGNOSIS — M79674 Pain in right toe(s): Secondary | ICD-10-CM | POA: Diagnosis not present

## 2023-05-29 NOTE — Progress Notes (Signed)
 Subjective:  Patient ID: Martha Wilson, female    DOB: 10-11-1950,  MRN: [id number]  Chief Complaint  Patient presents with   Nail Problem    Nail trim     73 y.o. female presents with the above complaint.  Patient presents with thickened elongated dystrophic toenails x10.  Pain to palpation.  They are not able to do with her themselves.  They would like for [place] to debride them down.  She does not have any secondary complaints at this time.  Review of Systems: Negative except as noted in the HPI. Denies N/V/F/Ch.  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 Swallow whole., Disp: 30 tablet, Rfl: 5   atorvastatin (LIPITOR) 20 MG tablet, Take 1 tablet (20 mg total) by mouth daily., Disp: 90 tablet, Rfl: 1   benzonatate (TESSALON) 100 MG capsule, Take 1 capsule (100 mg total) by mouth 2 (two) times daily as needed for cough., Disp: 20 capsule, Rfl: 0   calcium carbonate (OSCAL) 1500 (600 Ca) MG TABS tablet, Take 1,800 mg by mouth daily with breakfast., Disp: , Rfl:    cholecalciferol (VITAMIN D3) 25 MCG (1000 UNIT) tablet, Take 2,000 Units by mouth daily., Disp: , Rfl:    ciclopirox (PENLAC) 8 % solution, APPLY TOPICALLY AT BEDTIME. APPLY OVER NAIL AND SURROUNDING SKIN. APPLY DAILY OVER PREVIOUS COAT. AFTER  SEVEN (7) DAYS, MAY REMOVE WITH ALCOHOL AND CONTINUE CYCLE., Disp: 6.6 mL, Rfl: 0   Cyanocobalamin (B-12) 2500 MCG TABS, Take 2,500 mcg by mouth daily., Disp: , Rfl:    cycloSPORINE (RESTASIS) 0.05 % ophthalmic emulsion, 1 drop 2 (two) times daily., Disp: , Rfl:    Estradiol (YUVAFEM) 10 MCG TABS vaginal tablet, Place 1 tablet (10 mcg total) vaginally 2 (two) times a week., Disp: 26 tablet, Rfl: 3   fluticasone (FLONASE) 50 MCG/ACT nasal spray, Place 2 sprays into both nostrils daily., Disp: , Rfl:    methimazole (TAPAZOLE) 5 MG tablet, Take 0.5 tablets (2.5 mg total) by mouth at bedtime., Disp: 90 tablet, Rfl: 1   Multiple Vitamins-Minerals (PRESERVISION AREDS 2) CAPS, Take 1 capsule by mouth 2 (two) times daily., Disp: , Rfl:    Polyvinyl Alcohol-Povidone (REFRESH OP), Place 1 drop into both eyes 2 (two) times daily as needed (dry eye)., Disp: , Rfl:    predniSONE (DELTASONE) 10 MG tablet, 6 tablets on Day 1 , then reduce by 1 tablet daily until gone, Disp: 21 tablet, Rfl: 0   valACYclovir (VALTREX) 500 MG tablet, Take 1 tablet (500 mg total) by mouth daily., Disp: 90 tablet, Rfl: 1  Current Facility-Administered Medications:    [START ON 05/30/2023] denosumab (PROLIA) injection 60 mg,  60 mg, Subcutaneous, Q6 months, Sherlene Shams, MD  Social History   Tobacco Use  Smoking Status Former   Current packs/day: 0.00   Average packs/day: 0.5 packs/day for 35.0 years (17.5 ttl pk-yrs)   Types: Cigarettes   Start date: 05/23/1987   Quit date: 05/22/2022   Years since quitting: 1.0  Smokeless Tobacco Never  Tobacco Comments   "occasional" cigarette    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  There is no height or weight on file to calculate BMI. Constitutional Well  developed. Well nourished.  Vascular Dorsalis pedis pulses palpable bilaterally. Posterior tibial pulses palpable bilaterally. Capillary refill normal to all digits.  No cyanosis or clubbing noted. Pedal hair growth normal.  Neurologic Normal speech. Oriented to person, place, and time. Epicritic sensation to light touch grossly present bilaterally.  Dermatologic  dystrophic mycotic nail noted to the left hallux that seems to be slightly more severe than the ones listed below.  Mild pain on palpation. Nail examination shows thickened elongated dystrophic mycotic nails x10.  Pain on palpation Skin within normal limits  Orthopedic: Normal joint ROM without pain or crepitus bilaterally. No visible deformities. No bony tenderness.   Radiographs: None Assessment:   No diagnosis found.         Plan:  Patient was evaluated and treated and all questions answered.   Onychomycosis with pain  -Nails palliatively debrided as below. -Educated on self-care  Procedure: Nail Debridement Rationale: pain  Type of Debridement: manual, sharp debridement. Instrumentation: Nail nipper, rotary burr. Number of Nails: 10  Procedures and Treatment: Consent by patient was obtained for treatment procedures. The patient understood the discussion of treatment and procedures well. All questions were answered thoroughly reviewed. Debridement of mycotic and hypertrophic toenails, 1 through 5 bilateral and clearing of subungual debris. No ulceration, no infection noted.  Return Visit-Office Procedure: Patient instructed to return to the office for a follow up visit 3 months for continued evaluation and treatment.  Nicholes Rough, DPM    No follow-ups on file.   No follow-ups on file.

## 2023-05-30 ENCOUNTER — Telehealth: Payer: Self-pay | Admitting: Internal Medicine

## 2023-05-30 NOTE — Telephone Encounter (Signed)
 Could you please review codes for labs? If they are not correct could you please let me know or Erie Noe.   Thank you.      Copied from CRM [id number]. Topic: Complaint (DO NOT CONVERT) - Billing/Coding >> May 30, 2023  9:05 AM Lennart Pall wrote: DOS: 04/21/2023 Details of complaint: Patient was charged for an experimental test....  covid test and rsv and flu- [employer] needs the code to be changed. Being charged from [facility]  How would the patient like to see this issue resolved? Code changed for billing   Route to Research officer, political party.

## 2023-06-02 ENCOUNTER — Ambulatory Visit (INDEPENDENT_AMBULATORY_CARE_PROVIDER_SITE_OTHER): Payer: Medicare HMO

## 2023-06-02 DIAGNOSIS — M816 Localized osteoporosis [Lequesne]: Secondary | ICD-10-CM

## 2023-06-02 DIAGNOSIS — M810 Age-related osteoporosis without current pathological fracture: Secondary | ICD-10-CM

## 2023-06-02 MED ORDER — DENOSUMAB 60 MG/ML ~~LOC~~ SOSY
60.0000 mg | PREFILLED_SYRINGE | SUBCUTANEOUS | Status: AC
  Administered 2023-12-01: 60 mg via SUBCUTANEOUS

## 2023-06-02 NOTE — Progress Notes (Signed)
 Pt presented for their subcutaneous Prolia injection. Pt was identified through two identifiers. Pt was given the information packets about the Prolia and told to schedule their next injection 6 months out. Pt tolerated the subq injection well in the left arm.

## 2023-06-06 ENCOUNTER — Telehealth: Payer: Self-pay | Admitting: Internal Medicine

## 2023-06-06 NOTE — Telephone Encounter (Signed)
 [facility] was called and code J06.9 was given. [facility] rep stated it would take up to 45 days to process.  Patient is aware.

## 2023-06-06 NOTE — Telephone Encounter (Signed)
 Patient was called and code J06.9 was given to her. Patient was ask to call insurance and dispute claim and see if the J code would make a difference.

## 2023-06-09 NOTE — Telephone Encounter (Signed)
 Received by mail a request from [facility] to review codes from DOS on May 19, 2023. Letter is up front in Dr Melina Schools color folder.

## 2023-06-09 NOTE — Telephone Encounter (Signed)
 I have placed in red folder for review

## 2023-06-11 NOTE — Telephone Encounter (Signed)
 Form has been faxed back to [facility].

## 2023-06-26 ENCOUNTER — Ambulatory Visit: Admitting: Family Medicine

## 2023-06-26 ENCOUNTER — Encounter: Payer: Self-pay | Admitting: Family Medicine

## 2023-06-26 VITALS — BP 136/70 | HR 81 | Temp 98.2°F | Resp 20 | Ht 65.0 in | Wt 132.0 lb

## 2023-06-26 DIAGNOSIS — R197 Diarrhea, unspecified: Secondary | ICD-10-CM

## 2023-06-26 NOTE — Patient Instructions (Signed)
 It was a pleasure meeting you today. Thank you for allowing me to take part in your health care.  Our goals for today as we discussed include:  We will get some labs today.  If they are abnormal or we need to do something about them, I will call you.  If they are normal, I will send you a message on MyChart (if it is active) or a letter in the mail.  If you don't hear from [place] in 2 weeks, please call the office at the number below.   If symptoms worsen please notify MD  Stop Dulcolax tablets   This is a list of the screening recommended for you and due dates:  Health Maintenance  Topic Date Due   Hepatitis C Screening  Never done   DTaP/Tdap/Td vaccine (1 - Tdap) Never done   Zoster (Shingles) Vaccine (1 of 2) Never done   COVID-19 Vaccine (5 - 2024-25 season) 12/08/2022   Medicare Annual Wellness Visit  02/20/2023   Flu Shot  07/07/2023*   Mammogram  02/12/2024   Colon Cancer Screening  09/21/2024   Pneumonia Vaccine  Completed   DEXA scan (bone density measurement)  Completed   HPV Vaccine  Aged Out  *Topic was postponed. The date shown is not the original due date.      If you have any questions or concerns, please do not hesitate to call the office at [phone].  I look forward to our next visit and until then take care and stay safe.  Regards,   Dana Allan, MD   [facility]

## 2023-06-26 NOTE — Progress Notes (Signed)
 SUBJECTIVE:   Chief Complaint  Patient presents with   Abdominal Pain    Comes and goes since last week   Pruritis    For 1 week    HPI Presents for acute visit  Discussed the use of AI scribe software for clinical note transcription with the patient, who gave verbal consent to proceed.  History of Present Illness Martha Wilson is a 73 year old female who presents with recent episodes of diarrhea and abdominal pain.  She experienced abdominal pain and diarrhea following a trip to [place], [place], approximately a week and a half ago. Initially, she had constipation and attempted to alleviate it with Smooth Move herbal tea, which was ineffective. She then took four Dulcolax chewables, which also did not result in a bowel movement. By Wednesday, she used glycerin suppositories, leading to diarrhea that lasted until Friday. The diarrhea was severe and accompanied by sharp abdominal pains. She initially suspected a viral infection due to exposure to others with similar symptoms. Upon returning home, she avoided eating due to fear of diarrhea, resulting in irregular bowel movements. By Thursday, she had a bowel movement, though not fully formed, and currently has no abdominal pain. No current diarrhea, abdominal pain, nausea, vomiting, fever, or blood in her stool. She feels bloated and has scattered, loose stools that are beginning to form.  Her normal bowel routine typically involves three bowel movements per day, often after coffee in the morning. She has a history of constipation but does not regularly use medications for it. She often has small bowel movements throughout the day, which may indicate chronic constipation. She has a history of normal pressure hydrocephalus and is diligent about her water intake to manage associated headaches.  Her diet includes scrambled eggs, bacon, Belvita crackers, salads, chicken, and salmon, with limited fiber intake. She drinks water  regularly, motivated by her normal pressure hydrocephalus to prevent headaches.      PERTINENT PMH / PSH: As above  OBJECTIVE:  BP 136/70   Pulse 81   Temp 98.2 F (36.8 C)   Resp 20   Ht 5\' 5"  (1.651 m)   Wt 132 lb (59.9 kg)   SpO2 97%   BMI 21.97 kg/m    Physical Exam Vitals reviewed.  Constitutional:      General: She is not in acute distress.    Appearance: Normal appearance. She is normal weight. She is not ill-appearing, toxic-appearing or diaphoretic.  Eyes:     General:        Right eye: No discharge.        Left eye: No discharge.     Conjunctiva/sclera: Conjunctivae normal.  Cardiovascular:     Rate and Rhythm: Normal rate and regular rhythm.     Heart sounds: Normal heart sounds.  Pulmonary:     Effort: Pulmonary effort is normal.     Breath sounds: Normal breath sounds.  Abdominal:     General: Bowel sounds are normal.     Tenderness: There is no abdominal tenderness. There is no right CVA tenderness, left CVA tenderness, guarding or rebound. Negative signs include Murphy's sign and McBurney's sign.     Hernia: No hernia is present.  Musculoskeletal:        General: Normal range of motion.  Skin:    General: Skin is warm and dry.  Neurological:     General: No focal deficit present.     Mental Status: She is alert and oriented to person, place,  and time. Mental status is at baseline.  Psychiatric:        Mood and Affect: Mood normal.        Behavior: Behavior normal.        Thought Content: Thought content normal.        Judgment: Judgment normal.           06/26/2023    1:54 PM 04/21/2023    3:47 PM 10/04/2022   10:02 AM 06/26/2022   10:11 AM 02/19/2022    1:08 PM  Depression screen PHQ 2/9  Decreased Interest 0 0 1 0 0  Down, Depressed, Hopeless 0 0 0 0 0  PHQ - 2 Score 0 0 1 0 0  Altered sleeping 0      Tired, decreased energy 0      Change in appetite 0      Feeling bad or failure about yourself  0      Trouble concentrating 0       Moving slowly or fidgety/restless 0      Suicidal thoughts 0      PHQ-9 Score 0      Difficult doing work/chores Not difficult at all          06/26/2023    1:54 PM 12/25/2020   12:58 PM  GAD 7 : Generalized Anxiety Score  Nervous, Anxious, on Edge 0 2  Control/stop worrying 0 3  Worry too much - different things 0 3  Trouble relaxing 1 2  Restless 0 1  Easily annoyed or irritable 0   Afraid - awful might happen 0 2  Total GAD 7 Score 1   Anxiety Difficulty Not difficult at all Somewhat difficult    ASSESSMENT/PLAN:  Diarrhea, unspecified type Assessment & Plan: Suspected viral gastroenteritis and stool softener use contributed to symptoms. No obstruction suspected. Symptoms have since improved. - Order blood work for dehydration, electrolytes, WBC count. - Advise against Dulcolax and glycerin suppositories. - Instruct to stay hydrated, eat bland foods, increase as tolerated. - Advise ED visit if abdominal pain or diarrhea worsens over weekend. - Consider evaluation for celiac disease or gluten sensitivity if symptoms persist. -If no improvement follow up with MD  Orders: -     CBC with Differential/Platelet -     Comprehensive metabolic panel with GFR   PDMP reviewed  Return if symptoms worsen or fail to improve, for PCP.  Dana Allan, MD

## 2023-06-27 LAB — COMPREHENSIVE METABOLIC PANEL
ALT: 23 U/L (ref 0–35)
AST: 26 U/L (ref 0–37)
Albumin: 4.3 g/dL (ref 3.5–5.2)
Alkaline Phosphatase: 69 U/L (ref 39–117)
BUN: 15 mg/dL (ref 6–23)
CO2: 25 meq/L (ref 19–32)
Calcium: 9.8 mg/dL (ref 8.4–10.5)
Chloride: 103 meq/L (ref 96–112)
Creatinine, Ser: 1.31 mg/dL — ABNORMAL HIGH (ref 0.40–1.20)
GFR: 40.15 mL/min — ABNORMAL LOW (ref >60.00)
Glucose, Bld: 84 mg/dL (ref 70–99)
Potassium: 4.2 meq/L (ref 3.5–5.1)
Sodium: 139 meq/L (ref 135–145)
Total Bilirubin: 0.3 mg/dL (ref 0.2–1.2)
Total Protein: 7.7 g/dL (ref 6.0–8.3)

## 2023-06-27 LAB — CBC WITH DIFFERENTIAL/PLATELET
Basophils Absolute: 0.1 10*3/uL (ref 0.0–0.1)
Basophils Relative: 1.1 % (ref 0.0–3.0)
Eosinophils Absolute: 0.1 10*3/uL (ref 0.0–0.7)
Eosinophils Relative: 1 % (ref 0.0–5.0)
HCT: 45.1 % (ref 36.0–46.0)
Hemoglobin: 15.3 g/dL — ABNORMAL HIGH (ref 12.0–15.0)
Lymphocytes Relative: 21.9 % (ref 12.0–46.0)
Lymphs Abs: 2.3 10*3/uL (ref 0.7–4.0)
MCHC: 34 g/dL (ref 30.0–36.0)
MCV: 108.9 fl — ABNORMAL HIGH (ref 78.0–100.0)
Monocytes Absolute: 0.8 10*3/uL (ref 0.1–1.0)
Monocytes Relative: 7.2 % (ref 3.0–12.0)
Neutro Abs: 7.3 10*3/uL (ref 1.4–7.7)
Neutrophils Relative %: 68.8 % (ref 43.0–77.0)
Platelets: 375 10*3/uL (ref 150.0–400.0)
RBC: 4.14 Mil/uL (ref 3.87–5.11)
RDW: 12.9 % (ref 11.5–15.5)
WBC: 10.6 10*3/uL — ABNORMAL HIGH (ref 4.0–10.5)

## 2023-07-05 ENCOUNTER — Encounter: Payer: Self-pay | Admitting: Family Medicine

## 2023-07-05 DIAGNOSIS — R197 Diarrhea, unspecified: Secondary | ICD-10-CM | POA: Insufficient documentation

## 2023-07-05 NOTE — Assessment & Plan Note (Addendum)
 Suspected viral gastroenteritis and stool softener use contributed to symptoms. No obstruction suspected. Symptoms have since improved. - Order blood work for dehydration, electrolytes, WBC count. - Advise against Dulcolax and glycerin suppositories. - Instruct to stay hydrated, eat bland foods, increase as tolerated. - Advise ED visit if abdominal pain or diarrhea worsens over weekend. - Consider evaluation for celiac disease or gluten sensitivity if symptoms persist. -If no improvement follow up with MD

## 2023-07-09 ENCOUNTER — Encounter: Payer: Self-pay | Admitting: Internal Medicine

## 2023-07-10 ENCOUNTER — Other Ambulatory Visit: Payer: Self-pay | Admitting: Internal Medicine

## 2023-07-10 DIAGNOSIS — E0590 Thyrotoxicosis, unspecified without thyrotoxic crisis or storm: Secondary | ICD-10-CM

## 2023-07-10 DIAGNOSIS — N1831 Chronic kidney disease, stage 3a: Secondary | ICD-10-CM

## 2023-07-10 NOTE — Assessment & Plan Note (Signed)
 Managed by [facility],  with methimazole 2.5 mg daily .

## 2023-07-28 ENCOUNTER — Telehealth: Payer: Self-pay

## 2023-07-28 NOTE — Telephone Encounter (Signed)
 Copied from CRM [id number]. Topic: Referral - Question >> Jul 25, 2023 12:50 PM Martha Wilson wrote: Reason for CRM: Patient was calling to check status of referral for nephrology. Patient has not gotten a response from office. Please contact patient regarding this matter

## 2023-07-28 NOTE — Telephone Encounter (Signed)
 Is see that the referral was placed on 07/10/2023. Can you tell me which office the referral was sent to so I can give the pt the number to call their office.

## 2023-08-06 ENCOUNTER — Telehealth: Payer: Self-pay | Admitting: Internal Medicine

## 2023-08-06 NOTE — Telephone Encounter (Signed)
 Copied from CRM [id number]. Topic: Referral - Status >> Aug 06, 2023  1:26 PM Juleen Oakland F wrote: Reason for CRM: Patient says Nephrology office never received referral and would like the referral refaxed to [phone] Attention: Avis Lemming

## 2023-08-07 NOTE — Telephone Encounter (Signed)
 Noted thank you

## 2023-08-07 NOTE — Telephone Encounter (Signed)
 LMTCB. Need to let pt know that we have re faxed the referral so someone should be calling her this week or next week.

## 2023-08-08 ENCOUNTER — Telehealth: Payer: Self-pay

## 2023-08-08 DIAGNOSIS — N1831 Chronic kidney disease, stage 3a: Secondary | ICD-10-CM

## 2023-08-08 DIAGNOSIS — R7303 Prediabetes: Secondary | ICD-10-CM

## 2023-08-08 NOTE — Telephone Encounter (Signed)
 Copied from CRM [id number]. Topic: General - Other >> Aug 08, 2023 10:50 AM Albertha Alosa wrote: Reason for CRM: [facility] called regarding the referral they received for patient    want repeat blood work before she schedules the patient to confirm persistent before accepting referral

## 2023-08-12 NOTE — Addendum Note (Signed)
 Addended by: Thersia Flax on: 08/12/2023 02:15 PM   Modules accepted: Orders

## 2023-08-12 NOTE — Telephone Encounter (Signed)
 Spoke with [facility] to find out exactly what blood work they were wanting repeated. They need the renal function panel repeated to make sure her kidney function has not returned to normal.

## 2023-08-12 NOTE — Telephone Encounter (Signed)
 Attempted to call [facility]. No answer. Will attempt to call again later.

## 2023-08-12 NOTE — Addendum Note (Signed)
 Addended by: Chadwick Colonel on: 08/12/2023 01:45 PM   Modules accepted: Orders

## 2023-08-18 NOTE — Addendum Note (Signed)
 Addended by: Taylynn Easton on: 08/18/2023 03:49 PM   Modules accepted: Orders

## 2023-08-18 NOTE — Telephone Encounter (Signed)
 Spoke with pt and scheduled her of a lab appt.

## 2023-08-18 NOTE — Telephone Encounter (Signed)
 Patient needs a call back regarding blood work

## 2023-08-19 ENCOUNTER — Other Ambulatory Visit (INDEPENDENT_AMBULATORY_CARE_PROVIDER_SITE_OTHER)

## 2023-08-19 DIAGNOSIS — R7303 Prediabetes: Secondary | ICD-10-CM | POA: Diagnosis not present

## 2023-08-19 DIAGNOSIS — N1831 Chronic kidney disease, stage 3a: Secondary | ICD-10-CM | POA: Diagnosis not present

## 2023-08-19 LAB — RENAL FUNCTION PANEL
Albumin: 4.4 g/dL (ref 3.5–5.2)
BUN: 22 mg/dL (ref 6–23)
CO2: 25 meq/L (ref 19–32)
Calcium: 10.6 mg/dL — ABNORMAL HIGH (ref 8.4–10.5)
Chloride: 104 meq/L (ref 96–112)
Creatinine, Ser: 0.88 mg/dL (ref 0.40–1.20)
GFR: 64.65 mL/min (ref >60.00)
Glucose, Bld: 95 mg/dL (ref 70–99)
Phosphorus: 4.3 mg/dL (ref 2.3–4.6)
Potassium: 4.1 meq/L (ref 3.5–5.1)
Sodium: 138 meq/L (ref 135–145)

## 2023-08-19 LAB — HEMOGLOBIN A1C: Hgb A1c MFr Bld: 6.1 % (ref 4.6–6.5)

## 2023-08-20 ENCOUNTER — Ambulatory Visit: Payer: Self-pay | Admitting: Internal Medicine

## 2023-08-22 ENCOUNTER — Ambulatory Visit: Payer: Self-pay

## 2023-08-22 NOTE — Telephone Encounter (Signed)
 Copied from CRM [id number]. Topic: Clinical - Red Word Triage >> Aug 22, 2023  8:11 AM Ivette P wrote: [facility] that prompted transfer to Nurse Triage: PT is sick, fever and soare throat, ear  ache, sutffed and coughing.   is it cold or flu, took covid test and negative. (covid was old)   Fever is over 100, unsure exactly.  Chief Complaint: fever, cough with yellow sputum, sore throat, ear ache Symptoms: see above Frequency: two days ago Pertinent Negatives: Patient denies cp, sob Disposition: [] ED /[x] Urgent Care (no appt availability in office) / [] Appointment(In office/virtual)/ []  [facility]/ [] Home Care/ [] Refused Recommended Disposition /[] [facility]/ []  Follow-up with PCP Additional Notes: no apts available; instructed to go to UC.  Pcp office updated.   Reason for Disposition  [1] Fever comes and goes (intermittent) AND [2] lasts > 3 weeks  Answer Assessment - Initial Assessment Questions 1. TEMPERATURE: "What is the most recent temperature?"  "How was it measured?"      Cannot take 2. ONSET: "When did the fever start?"      yesterday 3. CHILLS: "Do you have chills?" If yes: "How bad are they?"  (e.g., none, mild, moderate, severe)   - NONE: no chills   - MILD: feeling cold   - MODERATE: feeling very cold, some shivering (feels better under a thick blanket)   - SEVERE: feeling extremely cold with shaking chills (general body shaking, rigors; even under a thick blanket)      moderate 4. OTHER SYMPTOMS: "Do you have any other symptoms besides the fever?"  (e.g., abdomen pain, cough, diarrhea, earache, headache, sore throat, urination pain)     Ear ache, sore throat cough with yellow sputum 5. CAUSE: If there are no symptoms, ask: "What do you think is causing the fever?"      na 6. CONTACTS: "Does anyone else in the family have an infection?"     States mother is on hospice and has been around her 7. TREATMENT: "What have you done so far to treat this  fever?" (e.g., medications)     aspirin  8. IMMUNOCOMPROMISE: "Do you have of the following: diabetes, HIV positive, splenectomy, cancer chemotherapy, chronic steroid treatment, transplant patient, etc."     no 9. PREGNANCY: "Is there any chance you are pregnant?" "When was your last menstrual period?"     na 10. TRAVEL: "Have you traveled out of the country in the last month?" (e.g., travel history, exposures)       no  Protocols used: Fever-A-AH

## 2023-08-22 NOTE — Telephone Encounter (Signed)
 FYI patient is going to [facility]

## 2023-08-26 ENCOUNTER — Ambulatory Visit: Admitting: Podiatry

## 2023-08-26 ENCOUNTER — Ambulatory Visit: Payer: Medicare HMO | Admitting: Podiatry

## 2023-08-26 DIAGNOSIS — L600 Ingrowing nail: Secondary | ICD-10-CM

## 2023-08-26 NOTE — Patient Instructions (Signed)

## 2023-08-26 NOTE — Progress Notes (Signed)
 Subjective:  Patient ID: Martha Wilson, female    DOB: 1951/02/02,  MRN: [id number]  Chief Complaint  Patient presents with   Nail Problem    Nail trim     73 y.o. female presents with the above complaint.  Patient presents with right hallux medial border ingrown painful to touch is progressing and worse worse with ambulation and shoe pressure she would like to have it removed she has not seen anyone as prior to seeing me denies any other acute complaints pain scale 7 out of 10 dull aching nature   Review of Systems: Negative except as noted in the HPI. Denies N/V/F/Ch.  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 Swallow whole., Disp: 30 tablet, Rfl: 5   atorvastatin  (LIPITOR) 20 MG tablet, Take 1 tablet (20 mg total) by mouth daily., Disp: 90 tablet, Rfl: 1   calcium  carbonate (OSCAL) 1500 (600 Ca) MG TABS tablet, Take 1,800 mg by mouth daily with breakfast., Disp: , Rfl:    cholecalciferol (VITAMIN D3) 25 MCG (1000 UNIT) tablet, Take 2,000 Units by mouth daily., Disp: , Rfl:    ciclopirox  (PENLAC ) 8 % solution, APPLY TOPICALLY AT BEDTIME. APPLY OVER NAIL AND SURROUNDING SKIN. APPLY DAILY OVER PREVIOUS COAT. AFTER SEVEN (7) DAYS, MAY REMOVE WITH ALCOHOL AND CONTINUE CYCLE., Disp: 6.6 mL, Rfl: 0    Cyanocobalamin  (B-12) 2500 MCG TABS, Take 2,500 mcg by mouth daily., Disp: , Rfl:    cycloSPORINE (RESTASIS) 0.05 % ophthalmic emulsion, 1 drop 2 (two) times daily., Disp: , Rfl:    Estradiol  (YUVAFEM ) 10 MCG TABS vaginal tablet, Place 1 tablet (10 mcg total) vaginally 2 (two) times a week., Disp: 26 tablet, Rfl: 3   fluticasone (FLONASE) 50 MCG/ACT nasal spray, Place 2 sprays into both nostrils daily., Disp: , Rfl:    methimazole  (TAPAZOLE ) 5 MG tablet, Take 0.5 tablets (2.5 mg total) by mouth at bedtime., Disp: 90 tablet, Rfl: 1   Multiple Vitamins-Minerals (PRESERVISION AREDS 2) CAPS, Take 1 capsule by mouth 2 (two) times daily., Disp: , Rfl:    Polyvinyl Alcohol-Povidone (REFRESH OP), Place 1 drop into both eyes 2 (two) times daily as needed (dry eye)., Disp: , Rfl:    valACYclovir  (VALTREX ) 500 MG tablet, Take 1 tablet (500 mg total) by mouth daily., Disp: 90 tablet, Rfl: 1  Current Facility-Administered Medications:    denosumab  (PROLIA ) injection 60 mg, 60 mg, Subcutaneous, Q6 months, Tullo, Teresa L, MD, 60 mg at 06/02/23 0950   [START ON 12/01/2023] denosumab  (PROLIA ) injection 60 mg, 60 mg, Subcutaneous, Q6 months, Thersia Flax, MD  Social History   Tobacco Use  Smoking Status Former   Current  packs/day: 0.00   Average packs/day: 0.5 packs/day for 35.0 years (17.5 ttl pk-yrs)   Types: Cigarettes   Start date: 05/23/1987   Quit date: 05/22/2022   Years since quitting: 1.2  Smokeless Tobacco Never  Tobacco Comments   "occasional" cigarette    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 There is no height or weight on file to calculate BMI. Constitutional Well developed. Well nourished.  Vascular Dorsalis pedis pulses palpable bilaterally. Posterior tibial  pulses palpable bilaterally. Capillary refill normal to all digits.  No cyanosis or clubbing noted. Pedal hair growth normal.  Neurologic Normal speech. Oriented to person, place, and time. Epicritic sensation to light touch grossly present bilaterally.  Dermatologic Painful ingrowing nail at medial nail borders of the hallux nail right. No other open wounds. No skin lesions.  Orthopedic: Normal joint ROM without pain or crepitus bilaterally. No visible deformities. No bony tenderness.   Radiographs: None Assessment:   1. Ingrown toenail of right foot    Plan:  Patient was evaluated and treated and all questions answered.  Ingrown Nail, right -Patient elects to proceed with minor surgery to remove ingrown toenail removal today. Consent reviewed and signed by patient. -Ingrown nail excised. See procedure note. -Educated on post-procedure care including soaking. Written instructions provided and reviewed. -Patient to follow up in 2 weeks for nail check.  Procedure: Excision of Ingrown Toenail Location: Right 1st toe medial nail borders. Anesthesia: Lidocaine  1% plain; 1.5 mL and Marcaine 0.5% plain; 1.5 mL, digital block. Skin Prep: Betadine. Dressing: Silvadene; telfa; dry, sterile, compression dressing. Technique: Following skin prep, the toe was exsanguinated and a tourniquet was secured at the base of the toe. The affected nail border was freed, split with a nail splitter, and excised. Chemical matrixectomy was then performed with phenol and irrigated out with alcohol. The tourniquet was then removed and sterile dressing applied. Disposition: Patient tolerated procedure well. Patient to return in 2 weeks for follow-up.   No follow-ups on file.

## 2023-09-03 ENCOUNTER — Ambulatory Visit: Admitting: Podiatry

## 2023-09-03 ENCOUNTER — Encounter: Payer: Self-pay | Admitting: Podiatry

## 2023-09-03 DIAGNOSIS — L600 Ingrowing nail: Secondary | ICD-10-CM

## 2023-09-06 NOTE — Progress Notes (Signed)
  Subjective:  Patient ID: Martha Wilson, female    DOB: 1950/10/09,  MRN: [id number]  Chief Complaint  Patient presents with   Nail Problem    "I want him to check the toe where Dr. Michalene Agee did the ingrown toenail.  I don't think it is but it's still troublesome."    73 y.o. female presents with the above complaint. History confirmed with patient.  Procedure was performed last week with Dr. Lydia Sams  Objective:  Physical Exam: warm, good capillary refill, no trophic changes or ulcerative lesions, normal DP and PT pulses, normal sensory exam, and ingrown nail healing well with mild erythema no ascending cellulitis, erythema is isolated to the immediate nail fold adjacent to the matricectomy site no active drainage no pain  Assessment:   1. Ingrown toenail of right foot      Plan:  Patient was evaluated and treated and all questions answered.  Discussed with her this is likely a normal appearance of the nail and is inflammatory action due to the phenolic acid.  Continue soaking until it resolves.  Return as needed.  Return if symptoms worsen or fail to improve.

## 2023-09-10 ENCOUNTER — Other Ambulatory Visit: Payer: Self-pay | Admitting: Internal Medicine

## 2023-09-18 ENCOUNTER — Telehealth: Payer: Self-pay | Admitting: Cardiology

## 2023-09-18 MED ORDER — ATORVASTATIN CALCIUM 20 MG PO TABS: 20.0000 mg | ORAL_TABLET | Freq: Every day | ORAL | 1 refills | Status: DC

## 2023-09-18 NOTE — Telephone Encounter (Signed)
*  STAT* If patient is at the pharmacy, call can be transferred to refill team.   1. Which medications need to be refilled? (please list name of each medication and dose if known)   atorvastatin  (LIPITOR) 20 MG tablet    2. Which pharmacy/location (including street and city if local pharmacy) is medication to be sent to? [id number] [place], [place] [address] Phone: [phone]  Fax: [phone]     3. Do they need a 30 day or 90 day supply? 90  Per pt pharmacy doesn't have the script. Please re-send

## 2023-09-18 NOTE — Telephone Encounter (Signed)
 RX sent to requested Pharmacy

## 2023-10-17 ENCOUNTER — Other Ambulatory Visit

## 2023-10-17 ENCOUNTER — Telehealth: Payer: Self-pay | Admitting: Internal Medicine

## 2023-10-17 NOTE — Telephone Encounter (Signed)
 Patient is on the schedule today, and need lab orders

## 2023-10-20 ENCOUNTER — Encounter: Payer: Self-pay | Admitting: Internal Medicine

## 2023-10-20 ENCOUNTER — Ambulatory Visit: Payer: Medicare HMO | Admitting: Internal Medicine

## 2023-10-20 VITALS — BP 100/60 | HR 72 | Ht 65.0 in | Wt 127.0 lb

## 2023-10-20 DIAGNOSIS — K6289 Other specified diseases of anus and rectum: Secondary | ICD-10-CM

## 2023-10-20 DIAGNOSIS — R7303 Prediabetes: Secondary | ICD-10-CM | POA: Diagnosis not present

## 2023-10-20 DIAGNOSIS — R32 Unspecified urinary incontinence: Secondary | ICD-10-CM

## 2023-10-20 DIAGNOSIS — N3946 Mixed incontinence: Secondary | ICD-10-CM

## 2023-10-20 DIAGNOSIS — R159 Full incontinence of feces: Secondary | ICD-10-CM

## 2023-10-20 DIAGNOSIS — R195 Other fecal abnormalities: Secondary | ICD-10-CM

## 2023-10-20 MED ORDER — METHIMAZOLE 5 MG PO TABS: 2.5000 mg | ORAL_TABLET | Freq: Every day | ORAL | 1 refills | Status: AC

## 2023-10-20 NOTE — Progress Notes (Unsigned)
 Subjective:  Patient ID: Martha Wilson, female    DOB: December 27, 1950  Age: 73 y.o. MRN: [id number]  CC: There were no encounter diagnoses.   HPI Martha Wilson presents for  Chief Complaint  Patient presents with   Medical Management of Chronic Issues    6 month follow up    1) bowel incontinence :  first experienced 2-3 weeks ago ,  but has noticed that during urinary voids for the last several months she passes A small stool (unaware) .  For the last several weeks she has had excessive flatulence and bowel incontinence INVOLVING SMALL CALIBER STOOLS.  nable to tighten her sphincter.   Wearing depends . Yesterday noted small diameter  stools,  along with loose stools.   HAS NOT TRIED IMODIUM BC SHE HS CHRNOIC CONSTIPATION   Prediabetes:   Has been wearing a CBG monitor        last colonoscopy 2023.  3 yr follow up advised for multple  1 cm polyps.    2)    Outpatient Medications Prior to Visit  Medication Sig Dispense Refill   acetaminophen  (TYLENOL ) 500 MG tablet Take 1,000 mg by mouth every 6 (six) hours as needed for moderate pain or headache.     aspirin  EC 81 MG tablet Take 1 tablet (81 mg total) by mouth daily. Swallow whole. 30 tablet 5   atorvastatin  (LIPITOR) 20 MG tablet Take 1 tablet (20 mg total) by mouth daily. 90 tablet 1   cholecalciferol (VITAMIN D3) 25 MCG (1000 UNIT) tablet Take 2,000 Units by mouth daily.     Cyanocobalamin  (B-12) 2500 MCG TABS Take 2,500 mcg by mouth daily.     cycloSPORINE (RESTASIS) 0.05 % ophthalmic emulsion 1 drop 2 (two) times daily.     fluticasone (FLONASE) 50 MCG/ACT nasal spray Place 2 sprays into both nostrils daily.     methimazole  (TAPAZOLE ) 5 MG tablet Take 0.5 tablets (2.5 mg total) by mouth at bedtime. 90 tablet 1   Multiple Vitamins-Minerals (PRESERVISION AREDS 2) CAPS Take 1 capsule by mouth 2 (two) times daily.     valACYclovir  (VALTREX ) 500 MG tablet Take 1 tablet (500 mg total) by mouth daily. 90 tablet 1    calcium  carbonate (OSCAL) 1500 (600 Ca) MG TABS tablet Take 1,800 mg by mouth daily with breakfast. (Patient not taking: Reported on 10/20/2023)     ciclopirox  (PENLAC ) 8 % solution APPLY TOPICALLY AT BEDTIME. APPLY OVER NAIL AND SURROUNDING SKIN. APPLY DAILY OVER PREVIOUS COAT. AFTER SEVEN (7) DAYS, MAY REMOVE WITH ALCOHOL AND CONTINUE CYCLE. (Patient not taking: Reported on 10/20/2023) 6.6 mL 0   Estradiol  (YUVAFEM ) 10 MCG TABS vaginal tablet Place 1 tablet (10 mcg total) vaginally 2 (two) times a week. (Patient not taking: Reported on 10/20/2023) 26 tablet 3   Polyvinyl Alcohol-Povidone (REFRESH OP) Place 1 drop into both eyes 2 (two) times daily as needed (dry eye).     Facility-Administered Medications Prior to Visit  Medication Dose Route Frequency Provider Last Rate Last Admin   denosumab  (PROLIA ) injection 60 mg  60 mg Subcutaneous Q6 months Hilaria Titsworth L, MD   60 mg at 06/02/23 0950   [START ON 12/01/2023] denosumab  (PROLIA ) injection 60 mg  60 mg Subcutaneous Q6 months [name] Verneita CROME, MD        Review of Systems;  Patient denies headache, fevers, malaise, unintentional weight loss, skin rash, eye pain, sinus congestion and sinus pain, sore throat, dysphagia,  hemoptysis , cough, dyspnea, wheezing, chest pain,  palpitations, orthopnea, edema, abdominal pain, nausea, melena, diarrhea, constipation, flank pain, dysuria, hematuria, urinary  Frequency, nocturia, numbness, tingling, seizures,  Focal weakness, Loss of consciousness,  Tremor, insomnia, depression, anxiety, and suicidal ideation.      Objective:  BP 100/60   Pulse 72   Ht 5' 5 (1.651 m)   Wt 127 lb (57.6 kg)   SpO2 97%   BMI 21.13 kg/m   BP Readings from Last 3 Encounters:  10/20/23 100/60  06/26/23 136/70  04/22/23 110/72    Wt Readings from Last 3 Encounters:  10/20/23 127 lb (57.6 kg)  06/26/23 132 lb (59.9 kg)  04/22/23 132 lb (59.9 kg)    Physical Exam  Lab Results  Component Value Date   HGBA1C 6.1  08/19/2023   HGBA1C 6.1 03/31/2023   HGBA1C 6.0 09/23/2022    Lab Results  Component Value Date   CREATININE 0.88 08/19/2023   CREATININE 1.31 (H) 06/26/2023   CREATININE 0.97 03/31/2023    Lab Results  Component Value Date   WBC 10.6 (H) 06/26/2023   HGB 15.3 (H) 06/26/2023   HCT 45.1 06/26/2023   PLT 375.0 06/26/2023   GLUCOSE 95 08/19/2023   CHOL 130 03/31/2023   TRIG 108.0 03/31/2023   HDL 60.30 03/31/2023   LDLDIRECT 51.0 03/31/2023   LDLCALC 48 03/31/2023   ALT 23 06/26/2023   AST 26 06/26/2023   NA 138 08/19/2023   K 4.1 08/19/2023   CL 104 08/19/2023   CREATININE 0.88 08/19/2023   BUN 22 08/19/2023   CO2 25 08/19/2023   TSH 1.97 10/04/2022   HGBA1C 6.1 08/19/2023    MM 3D SCREENING MAMMOGRAM BILATERAL BREAST Result Date: 02/14/2023 CLINICAL DATA:  Screening. EXAM: DIGITAL SCREENING BILATERAL MAMMOGRAM WITH TOMOSYNTHESIS AND CAD TECHNIQUE: Bilateral screening digital craniocaudal and mediolateral oblique mammograms were obtained. Bilateral screening digital breast tomosynthesis was performed. The images were evaluated with computer-aided detection. COMPARISON:  Previous exam(s). ACR Breast Density Category c: The breasts are heterogeneously dense, which may obscure small masses. FINDINGS: There are no findings suspicious for malignancy. IMPRESSION: No mammographic evidence of malignancy. A result letter of this screening mammogram will be mailed directly to the patient. RECOMMENDATION: Screening mammogram in one year. (Code:SM-B-01Y) BI-RADS CATEGORY  1: Negative. Electronically Signed   By: Corean Salter M.D.   On: 02/14/2023 14:51    Assessment & Plan:  .There are no diagnoses linked to this encounter.   I spent 34 minutes on the day of this face to face encounter reviewing patient's  most recent visit with cardiology,  nephrology,  and neurology,  prior relevant surgical and non surgical procedures, recent  labs and imaging studies, counseling on weight  management,  reviewing the assessment and plan with patient, and post visit ordering and reviewing of  diagnostics and therapeutics with patient  .   Follow-up: No follow-ups on file.   Verneita LITTIE Kettering, MD

## 2023-10-20 NOTE — Patient Instructions (Signed)
 PLEASE SCHEDULE A FASTING LAB APPT ON OR AFTER AUGUST 14  REFERRALS TO PHYSICAL THERAPY FOR PELVIC FLOOR PT AND TO DR ANNA IN PROGRESS

## 2023-10-20 NOTE — Assessment & Plan Note (Signed)
 Has been wearing a CBG monitor, exercising more and restricting sugars.

## 2023-10-21 ENCOUNTER — Other Ambulatory Visit: Payer: Medicare HMO

## 2023-10-21 ENCOUNTER — Encounter: Payer: Self-pay | Admitting: Internal Medicine

## 2023-10-21 DIAGNOSIS — R32 Unspecified urinary incontinence: Secondary | ICD-10-CM | POA: Insufficient documentation

## 2023-10-21 NOTE — Assessment & Plan Note (Signed)
 Secondary to NPH vs other acquired physical abnormalities.  Referring for pelvic floor PT and GI for sigmoidoscopy

## 2023-10-21 NOTE — Assessment & Plan Note (Signed)
 Improved with treatment of NPH, mnaged with use of light pads.

## 2023-10-31 ENCOUNTER — Telehealth: Payer: Self-pay

## 2023-10-31 ENCOUNTER — Other Ambulatory Visit (HOSPITAL_COMMUNITY): Payer: Self-pay

## 2023-10-31 NOTE — Telephone Encounter (Signed)
 SABRA

## 2023-10-31 NOTE — Telephone Encounter (Signed)
 Prolia  VOB initiated via [email or web address]  Next Prolia  inj DUE: 11/30/23

## 2023-10-31 NOTE — Telephone Encounter (Signed)
 Pt ready for scheduling for PROLIA  on or after : 11/30/23  Option# 1: Buy/Bill (Office supplied medication)  Out-of-pocket cost due at time of clinic visit: $25  Number of injection/visits approved: 2  Primary: [employer] Prolia  co-insurance: 0% Admin fee co-insurance: $25  Secondary: --- Prolia  co-insurance:  Admin fee co-insurance:   Medical Benefit Details: Date Benefits were checked: 10/31/23 Deductible: NO/ Coinsurance: 0%/ Admin Fee: $25  Prior Auth: APPROVED PA# [id number] Expiration Date: 04/27/23-04/26/24   # of doses approved: 2 ----------------------------------------------------------------------- Option# 2- Med Obtained from pharmacy:  Pharmacy benefit: Copay $--- (Paid to pharmacy) Admin Fee: --- (Pay at clinic)  Prior Auth: --- PA# Expiration Date:   # of doses approved:   If patient wants fill through the pharmacy benefit please send prescription to: ---, and include estimated need by date in rx notes. Pharmacy will ship medication directly to the office.  Patient NOT eligible for Prolia  Copay Card. Copay Card can make patient's cost as little as $25. Link to apply: [email or web address]  ** This summary of benefits is an estimation of the patient's out-of-pocket cost. Exact cost may very based on individual plan coverage.

## 2023-11-20 ENCOUNTER — Ambulatory Visit: Admitting: Podiatry

## 2023-11-25 ENCOUNTER — Other Ambulatory Visit

## 2023-11-26 ENCOUNTER — Ambulatory Visit: Admitting: Physical Therapy

## 2023-11-27 ENCOUNTER — Encounter: Payer: Self-pay | Admitting: *Deleted

## 2023-11-27 ENCOUNTER — Other Ambulatory Visit: Payer: Self-pay | Admitting: *Deleted

## 2023-11-27 DIAGNOSIS — M816 Localized osteoporosis [Lequesne]: Secondary | ICD-10-CM

## 2023-12-01 ENCOUNTER — Encounter: Payer: Self-pay | Admitting: Physical Therapy

## 2023-12-01 ENCOUNTER — Other Ambulatory Visit (INDEPENDENT_AMBULATORY_CARE_PROVIDER_SITE_OTHER)

## 2023-12-01 ENCOUNTER — Encounter: Admitting: Physical Therapy

## 2023-12-01 ENCOUNTER — Ambulatory Visit (INDEPENDENT_AMBULATORY_CARE_PROVIDER_SITE_OTHER): Payer: Medicare HMO

## 2023-12-01 ENCOUNTER — Other Ambulatory Visit: Payer: Self-pay

## 2023-12-01 ENCOUNTER — Ambulatory Visit: Attending: Internal Medicine | Admitting: Physical Therapy

## 2023-12-01 DIAGNOSIS — R7303 Prediabetes: Secondary | ICD-10-CM | POA: Diagnosis not present

## 2023-12-01 DIAGNOSIS — M5459 Other low back pain: Secondary | ICD-10-CM | POA: Diagnosis present

## 2023-12-01 DIAGNOSIS — K6289 Other specified diseases of anus and rectum: Secondary | ICD-10-CM | POA: Diagnosis not present

## 2023-12-01 DIAGNOSIS — M810 Age-related osteoporosis without current pathological fracture: Secondary | ICD-10-CM | POA: Diagnosis not present

## 2023-12-01 DIAGNOSIS — M533 Sacrococcygeal disorders, not elsewhere classified: Secondary | ICD-10-CM | POA: Diagnosis present

## 2023-12-01 DIAGNOSIS — R2689 Other abnormalities of gait and mobility: Secondary | ICD-10-CM | POA: Diagnosis present

## 2023-12-01 DIAGNOSIS — M816 Localized osteoporosis [Lequesne]: Secondary | ICD-10-CM

## 2023-12-01 LAB — BASIC METABOLIC PANEL WITH GFR
BUN: 18 mg/dL (ref 6–23)
CO2: 25 meq/L (ref 19–32)
Calcium: 9.3 mg/dL (ref 8.4–10.5)
Chloride: 102 meq/L (ref 96–112)
Creatinine, Ser: 0.96 mg/dL (ref 0.40–1.20)
GFR: 58.13 mL/min — ABNORMAL LOW (ref >60.00)
Glucose, Bld: 84 mg/dL (ref 70–99)
Potassium: 4.7 meq/L (ref 3.5–5.1)
Sodium: 137 meq/L (ref 135–145)

## 2023-12-01 LAB — HEMOGLOBIN A1C: Hgb A1c MFr Bld: 6.2 % (ref 4.6–6.5)

## 2023-12-01 MED ORDER — DENOSUMAB 60 MG/ML ~~LOC~~ SOSY: 60.0000 mg | PREFILLED_SYRINGE | SUBCUTANEOUS | Status: AC

## 2023-12-01 NOTE — Therapy (Unsigned)
 OUTPATIENT PHYSICAL THERAPY EVALUATION   Patient Name: Martha Wilson MRN: [id number] DOB:1950/11/07, 73 y.o., female Today's Date: 12/01/2023   PT End of Session - 12/01/23 1119     Visit Number 1    Number of Visits 10    Date for PT Re-Evaluation 02/09/24    PT Start Time 1110    PT Stop Time 1150    PT Time Calculation (min) 40 min    Activity Tolerance Patient tolerated treatment well;No increased pain    Behavior During Therapy WFL for tasks assessed/performed          Past Medical History:  Diagnosis Date   Allergies    Arthritis    right knee   Headache    History of methicillin resistant staphylococcus aureus (MRSA)    Hyperthyroidism    MVP (mitral valve prolapse)    Normal pressure hydrocephalus (HCC)    Osteopenia    Palpitations    stress related   Skin cancer    BASAL CELL   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   by [facility] for NPH   Tricuspid valve prolapse    Wears dentures    partial upper and lower   Past Surgical History:  Procedure Laterality Date   APPLICATION OF CRANIAL NAVIGATION N/A 05/31/2020   Procedure: APPLICATION OF CRANIAL NAVIGATION;  Surgeon: Clois Reeves, MD;  Location: [facility];  Service: Neurosurgery;  Laterality: N/A;   BREAST BIOPSY  1980's   pt states she had a bx in the 80's, not sure of side, benign   breat biopsy     CATARACT EXTRACTION W/PHACO Right 09/22/2018   Procedure: CATARACT EXTRACTION PHACO AND INTRAOCULAR LENS PLACEMENT (IOC)  RIGHT panooptix;  Surgeon: Jaye Fallow, MD;  Location: [facility];  Service: Ophthalmology;  Laterality: Right;   CATARACT EXTRACTION W/PHACO Left 10/06/2018   Procedure: CATARACT EXTRACTION PHACO AND INTRAOCULAR LENS PLACEMENT (IOC) LEFT PANOPTIX LENS;  Surgeon: Jaye Fallow, MD;  Location: [facility];  Service: Ophthalmology;  Laterality: Left;   COLONOSCOPY     COLONOSCOPY WITH PROPOFOL  N/A 11/19/2018   Procedure:  COLONOSCOPY WITH PROPOFOL ;  Surgeon: Therisa Bi, MD;  Location: [facility];  Service: Gastroenterology;  Laterality: N/A;   COLONOSCOPY WITH PROPOFOL  N/A 09/21/2021   Procedure: COLONOSCOPY WITH PROPOFOL ;  Surgeon: Therisa Bi, MD;  Location: [facility] END[facility];  Service: Gastroenterology;  Laterality: N/A;   ORIF FOOT FRACTURE     VENTRICULOPERITONEAL SHUNT Right 05/31/2020   Procedure: SHUNT INSERTION VENTRICULAR-PERITONEAL;  Surgeon: Clois Reeves, MD;  Location: [facility];  Service: Neurosurgery;  Laterality: Right;  Dr Tye to assist   Patient Active Problem List   Diagnosis Date Noted   Urinary and fecal incontinence 10/21/2023   Diarrhea 07/05/2023   Macrocytosis 04/22/2023   Post-menopause atrophic vaginitis 04/22/2023   Aortic atherosclerosis (HCC) 04/22/2023   Dysuria 03/24/2023   Positive self-administered antigen test for COVID-19 11/11/2022   Neck pain without injury 10/05/2022   Skin cancer of arm 10/05/2022   Urinary frequency 10/05/2022   CKD stage 3a, GFR 45-59 ml/min (HCC) 10/04/2022   Nausea 06/26/2022   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 03/19/2022   Osteoporosis 02/19/2022   Cardiomyopathy (HCC) 06/25/2021   Pulmonary nodule, right 06/25/2021   Tinnitus aurium, bilateral 12/26/2020   Status post ventriculoperitoneal shunt 06/21/2020   NPH (normal pressure hydrocephalus) (HCC) 05/31/2020   Orthostasis 11/30/2019   Abnormal EKG 11/30/2019   Prediabetes 11/30/2019   Dizziness 11/08/2019   Balance disorder 10/03/2019  Sciatica neuralgia 10/01/2019   Tubular adenoma of colon 11/21/2018   Multiple thyroid  nodules 10/30/2018   Hyperthyroidism 10/30/2018   Tricuspid valve insufficiency, non-rheumatic 10/30/2018   Coronary artery disease due to lipid rich plaque 10/30/2018   Tobacco abuse 10/30/2018   Urge and stress incontinence 05/04/2014    PCP: [name]   REFERRING PROVIDER: [name]   REFERRING DIAG: Weakness of Anal Sphincter    Rationale for Evaluation and Treatment Rehabilitation  THERAPY DIAG:  Sacrococcygeal disorders, not elsewhere classified  Other abnormalities of gait and mobility  Other low back pain  ONSET DATE:   SUBJECTIVE:                                                                                                                                                                                           SUBJECTIVE STATEMENT: 1) urge incontinence : Pt wears a Depends when traveling. Pt has to drink 64 fl oz water a day and aim for 80 fl oz due to NPH.   Pt seeks public restrooms once an hour.   When not traveling pt does not wear a pantyliner.  Pt arrives home in her garage and the moment she opens the door., she has leakage before making it to the bathroom. This occurs 60% of the time   2)  fecal leakage: pt is not aware when it occurs . Pt has stool type 6-7 / bowel movements occurs daily. Straining across 10% of the time .   3) LBP radiating to posterior L midthigh:  Pt has to put ice on it to go to sleep once a month, 3 nights.. Sometimes it is 8/10 with traveling and carrying bag over her shoulder, lifting groceries. suitcase, objects from floor, lifting mattress to make her bed.   Pain eases with massage and stretches.   Pt missed a month of walking due to her back . Pt typically walks 1.2 miles.  Pt is not stretching after walking   Enjoys Chair yoga , walking 1.2 miles,   Pt used to do tai chi but she has concerns about falling due to NPH .  Pt has not been back since her ventriculoperitoneal shunt placement.    PERTINENT HISTORY:  Ventriculoperitoneal shunt (Right) due to  normal pressure hydrocephalus   PAIN:  Are you having pain? Yes: see above   PRECAUTIONS:  Normal pressure hydrocephalus:   Pt cannot go near magnets/ MRI  . Pt gets dizziness with leaning forward / gardening. Pt avoids gardening   WEIGHT BEARING RESTRICTIONS:   No   FALLS:  Has patient fallen in last 6  months? No   LIVING  ENVIRONMENT: Lives with: alone  Lives in: [place] [facility]  Stairs: in [place], one story, in [place], two stories    OCCUPATION: retired Runner, broadcasting/film/video   PLOF: IND   PATIENT GOALS:   Not having to go into the bathroom when she walks into the house. Be able to walk 30-60 min , be IND   OBJECTIVE:     [facility] PT Assessment - 12/01/23 1127       Palpation   SI assessment  R shoulder/ R pelvis lowered      Ambulation/Gait   Gait Comments 1.3 m/s  armswings present, heel striking, minimal knee /hip flexion             Self-Care   Self-Care Other Self-Care Comments    Other Self-Care Comments  explained regional interdependent approach to address pain at different body parts, explained upcoming visits to realign posture and spine.pelvis to improve Sx and achieve goals, showed anatomy images of deep core system ,      Therapeutic Activites    Therapeutic Activities Other Therapeutic Activities    Other Therapeutic Activities inquired about meaningful tasks and role of learning proper body mechanics, future sessions to help realign pelvis and spine and to learn co-activation of deep core system when performing against gravity tasks . These improvements will help address pain and Sx     Neuro Re-ed    Neuro Re-ed Details  cued for alignment and proprioception of pelvis and LKC and spine in  sit to stand and for proper sitting posture to activate deep core system            HOME EXERCISE PROGRAM: See pt instruction section    ASSESSMENT:  CLINICAL IMPRESSION: Pt is a  73 yo  who presents with  following issues which impact QOL, ADL,  fitness, social and community activities:    1) urge incontinence  2)  fecal leakage 3) LBP radiating to posterior L midthigh   Pt's musculoskeletal assessment revealed uneven pelvic girdle and shoulder height, asymmetries to gait pattern, limited spinal /pelvic mobility, dyscoordination and strength of pelvic  floor mm, hip weakness, poor body mechanics which places strain on the abdominal/pelvic floor mm. These are deficits that indicate an ineffective intraabdominal pressure system associated with increased risk for pt's Sx.     Pt will benefit from propioception/ coordination/ body mechanics training and education with gravity-loaded tasks at work and home and  fitness modifications in order to gain a more effective intraabdominal pressure system to minimize Sx. Advised pt to not perform sit-ups and crunches as these movement patterns lead to more downward forces on the pelvic floor, negatively impacting abdominopelvic/spinal dysfunctions.   Pt was provided education on etiology of Sx with anatomy, physiology explanation with images along with the benefits of customized pelvic PT Tx based on pt's medical conditions and musculoskeletal deficits.  Explained the physiology of deep core mm coordination and roles of pelvic floor function in urination, defecation, sexual function, and postural control with deep core mm system, and the role of posture and alignment to help pelvic issues.     Regional interdependent approaches will yield greater benefits in pt's POC.  Pt w/ ventriculoperitoneal shunt (Right) due to  normal pressure hydrocephalus and plan to use semi reclined positions in lieu of supine if needed.   Following Tx today which pt tolerated without complaints,  pt demo'd proper body mechanics to minimize straining pelvic floor.  Plan to address realignment of spine/ pelvis at next  session to help promote optimize IAP system for improved pelvic floor function, trunk stability, gait, balance, stabilization with mobility tasks.  Plan to address pelvic floor issues once pelvis and spine are realigned to yield better outcomes.                                                       Pt benefits from skilled PT.    OBJECTIVE IMPAIRMENTS decreased activity tolerance, decreased coordination, decreased  endurance, decreased mobility, difficulty walking, decreased ROM, decreased strength, decreased safety awareness, hypomobility, increased muscle spasms, impaired flexibility, improper body mechanics, postural dysfunction, and pain.   ACTIVITY LIMITATIONS  self-care,   home chores, work tasks    PARTICIPATION LIMITATIONS:  community, walking activities    PERSONAL FACTORS   affecting patient's functional outcome:    REHAB POTENTIAL: Good   CLINICAL DECISION MAKING: Evolving/moderate complexity   EVALUATION COMPLEXITY: Moderate    PATIENT EDUCATION:    Education details: Showed pt anatomy images. Explained muscles attachments/ connection, physiology of deep core system/ spinal- thoracic-pelvis-lower kinetic chain as they relate to pt's presentation, Sx, and past Hx. Explained what and how these areas of deficits need to be restored to balance and function    See Therapeutic activity / neuromuscular re-education section  Answered pt's questions.   Person educated: Patient Education method: Explanation, Demonstration, Tactile cues, Verbal cues, and Handouts Education comprehension: verbalized understanding, returned demonstration, verbal cues required, tactile cues required, and needs further education     PLAN: PT FREQUENCY: 1x/week   PT DURATION: 10 weeks   PLANNED INTERVENTIONS:   Gait training;Stair training;Functional mobility training;DME Instruction;Therapeutic activities;Therapeutic exercise;Balance training;Neuromuscular re-education;Patient/family education;Vestibular;Visual/perceptual remediation/compensation;Passive range of motion;Moist Heat;Cryotherapy;Traction;Canalith Repostioning;Joint Manipulations;Manual lymph drainage;Manual techniques;Scar mobilization;Energy conservation;Dry needling;ADLs/Self Care Home Management;Biofeedback;Electrical Stimulation;Taping    PLAN FOR NEXT SESSION: See clinical impression for plan     GOALS: Goals reviewed with patient?  Yes  SHORT TERM GOALS: Target date: 12/29/2023      Pt will demo IND with HEP                    Baseline: Not IND            Goal status: INITIAL   LONG TERM GOALS: Target date: 02/09/2024    1.Pt will demo proper deep core coordination without chest breathing and optimal excursion of diaphragm/pelvic floor in order to promote spinal stability and pelvic floor function  Baseline: dyscoordination Goal status: INITIAL  2.  Pt will demo proper body mechanics in against gravity tasks and ADLs  work tasks, fitness  to minimize straining pelvic floor / back    Baseline: not IND, improper form that places strain on pelvic floor  Goal status: INITIAL    3. Pt will demo reciprocal gait pattern, longer stride length  in order to ambulate safely in community and return to fitness routine , possibly return to tai chi Baseline:  armswings present, heel striking, minimal knee /hip flexion  Goal status: INITIAL    4. Pt will demo levelled pelvic girdle and shoulder height in order to progress to deep core strengthening HEP and restore mobility at spine, pelvis, gait, posture minimize falls, and improve balance  Baseline: R shoulder/ R pelvis lowered  Goal status: INITIAL   5. Pt will improve PFDI-7 questionnaire to  pts  score change  to demo improved QOL  Baseline:    ( greater pts indicate greater negative impact on QOL)  57   pts  ( total) 27   pts  ( UIQ-7 ) 27   pts  ( CRAIQ-7 ) 0   pts  ( POPIQ-7 )   Goal status: INITIAL  6. Pt will report no longer seeking bathroom in public restrooms every hour and instead, able to go once every 2 hours in order to shop and travel  Baseline:Pt wears a Depends when traveling. Pt has to drink 64 fl oz water a day and aim for 80 fl oz due to NPH.   Pt seeks public restrooms once an hour.   When not traveling pt does not wear a pantyliner.  Pt arrives home in her garage and the moment she opens the door., she has leakage before making it to  the bathroom. This occurs 60% of the time  Goal status: INITIAL  7. Pt will report increased Type 4 stool consistency to >25% of the time and no more straining . Pt will report less fecal leakage by 50% of the time  Baseline: . Pt has stool type 6-7 / bowel movements occurs daily. Straining across 10% of the time .  Goal status: INITIAL  8. Pt will report no leakage upon arriving in garage and able to to make it to the toilet before leakage across  > 50%  of the time  Baseline:Pt arrives home in her garage and the moment she opens the door., she has leakage before making it to the bathroom. This leakage  occurs 60% of the time  Goal status: INITIAL  Pia Lupe Plump, PT 12/01/2023, 11:33 AM

## 2023-12-01 NOTE — Progress Notes (Signed)
 Pt presented for their subcutaneous Prolia injection. Pt was identified through two identifiers. Pt was given the information packets about the Prolia and told to schedule their next injection 6 months out. Pt tolerated the subq injection well in the left arm.

## 2023-12-01 NOTE — Patient Instructions (Addendum)
   Proper body mechanics with getting out of a chair to decrease strain  on back &pelvic floor   Avoid holding your breath when Getting out of the chair:  Scoot to front part of chair chair Heels behind knees, feet are hip width apart, nose over toes  Inhale like you are smelling roses Exhale to stand   __  Sitting with feet on ground, four points of contact Catch yourself crossing ankles and thighs  __

## 2023-12-03 LAB — CALCIUM, IONIZED: Calcium, Ion: 5.2 mg/dL (ref 4.7–5.5)

## 2023-12-04 ENCOUNTER — Ambulatory Visit: Payer: Self-pay | Admitting: Internal Medicine

## 2023-12-05 ENCOUNTER — Other Ambulatory Visit: Payer: Self-pay | Admitting: Internal Medicine

## 2023-12-05 DIAGNOSIS — R238 Other skin changes: Secondary | ICD-10-CM

## 2023-12-15 ENCOUNTER — Ambulatory Visit: Attending: Internal Medicine | Admitting: Physical Therapy

## 2023-12-15 DIAGNOSIS — M533 Sacrococcygeal disorders, not elsewhere classified: Secondary | ICD-10-CM | POA: Diagnosis present

## 2023-12-15 DIAGNOSIS — R2689 Other abnormalities of gait and mobility: Secondary | ICD-10-CM | POA: Insufficient documentation

## 2023-12-15 DIAGNOSIS — M5459 Other low back pain: Secondary | ICD-10-CM | POA: Insufficient documentation

## 2023-12-15 NOTE — Patient Instructions (Addendum)
  ZigZag stretch  Reclined twist for hips and side of the hips/ legs  Lay on your back, knees bend, dig elbows and feet into bed to exhale and lift buttocks up to  Scoot hips to the L , leave shoulders in place Wobble knees to the R side 45 deg and to midline  10 reps   Then reach for R thigh with R hand cross body, straighten knee and point toes toward face, bend knee and repeat straigthen 10 reps to lengthen hamstring and ITband (side of hip)   Repeat on other side: Scoot hip R ,  leave shoulders in place Wobble knees to the L side 45 deg and to midline  10 reps   Then reach for L thigh with R hand cross body, straighten knee and point toes toward face, bend knee and repeat straigthen 10 reps to lengthen hamstring and ITband (side of hip)   __  Getting up from logrolling to sit to stand with pauses    __   Avoid straining pelvic floor, abdominal muscles , spine  Use log rolling technique instead of getting out of bed with your neck or the sit-up  Log rolling into and out of bed Log rolling into and out of bed If getting out of bed on R side, Bent knees, scoot hips/ shoulder to L  Raise R arm completely overhead, rolling onto armpit  Then lower bent knees to bed to get into complete side lying position  Then drop legs off bed, and push up onto R elbow/forearm, and use L hand to push onto the bed __ Proper body mechanics with getting out of a chair to decrease strain  on back &pelvic floor   Avoid holding your breath when Getting out of the chair:  Scoot to front part of chair chair Heels behind knees, feet are hip width apart, nose over toes  Inhale like you are smelling roses Exhale to stand  ___  Sitting with feet on ground, four points of contact Catch yourself crossing ankles and thighs

## 2023-12-16 NOTE — Therapy (Signed)
 OUTPATIENT PHYSICAL THERAPY TREATMENT    Patient Name: Martha Wilson MRN: [id number] DOB:04/08/1951, 73 y.o., female Today's Date: 12/16/2023   PT End of Session - 12/15/23 1101     Visit Number 2    Number of Visits 10    Date for PT Re-Evaluation 02/09/24    PT Start Time 1105    PT Stop Time 1150    PT Time Calculation (min) 45 min    Activity Tolerance Patient tolerated treatment well;No increased pain    Behavior During Therapy WFL for tasks assessed/performed          Past Medical History:  Diagnosis Date   Allergies    Arthritis    right knee   Headache    History of methicillin resistant staphylococcus aureus (MRSA)    Hyperthyroidism    MVP (mitral valve prolapse)    Normal pressure hydrocephalus (HCC)    Osteopenia    Palpitations    stress related   Skin cancer    BASAL CELL   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   by [facility] for NPH   Tricuspid valve prolapse    Wears dentures    partial upper and lower   Past Surgical History:  Procedure Laterality Date   APPLICATION OF CRANIAL NAVIGATION N/A 05/31/2020   Procedure: APPLICATION OF CRANIAL NAVIGATION;  Surgeon: Clois Reeves, MD;  Location: [facility];  Service: Neurosurgery;  Laterality: N/A;   BREAST BIOPSY  1980's   pt states she had a bx in the 80's, not sure of side, benign   breat biopsy     CATARACT EXTRACTION W/PHACO Right 09/22/2018   Procedure: CATARACT EXTRACTION PHACO AND INTRAOCULAR LENS PLACEMENT (IOC)  RIGHT panooptix;  Surgeon: Jaye Fallow, MD;  Location: [facility];  Service: Ophthalmology;  Laterality: Right;   CATARACT EXTRACTION W/PHACO Left 10/06/2018   Procedure: CATARACT EXTRACTION PHACO AND INTRAOCULAR LENS PLACEMENT (IOC) LEFT PANOPTIX LENS;  Surgeon: Jaye Fallow, MD;  Location: [facility];  Service: Ophthalmology;  Laterality: Left;   COLONOSCOPY     COLONOSCOPY WITH PROPOFOL  N/A 11/19/2018   Procedure:  COLONOSCOPY WITH PROPOFOL ;  Surgeon: Therisa Bi, MD;  Location: [facility];  Service: Gastroenterology;  Laterality: N/A;   COLONOSCOPY WITH PROPOFOL  N/A 09/21/2021   Procedure: COLONOSCOPY WITH PROPOFOL ;  Surgeon: Therisa Bi, MD;  Location: [facility];  Service: Gastroenterology;  Laterality: N/A;   ORIF FOOT FRACTURE     VENTRICULOPERITONEAL SHUNT Right 05/31/2020   Procedure: SHUNT INSERTION VENTRICULAR-PERITONEAL;  Surgeon: Clois Reeves, MD;  Location: [facility];  Service: Neurosurgery;  Laterality: Right;  Dr Tye to assist   Patient Active Problem List   Diagnosis Date Noted   Urinary and fecal incontinence 10/21/2023   Diarrhea 07/05/2023   Macrocytosis 04/22/2023   Post-menopause atrophic vaginitis 04/22/2023   Aortic atherosclerosis (HCC) 04/22/2023   Dysuria 03/24/2023   Positive self-administered antigen test for COVID-19 11/11/2022   Neck pain without injury 10/05/2022   Skin cancer of arm 10/05/2022   Urinary frequency 10/05/2022   CKD stage 3a, GFR 45-59 ml/min (HCC) 10/04/2022   Nausea 06/26/2022   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 03/19/2022   Osteoporosis 02/19/2022   Cardiomyopathy (HCC) 06/25/2021   Pulmonary nodule, right 06/25/2021   Tinnitus aurium, bilateral 12/26/2020   Status post ventriculoperitoneal shunt 06/21/2020   NPH (normal pressure hydrocephalus) (HCC) 05/31/2020   Orthostasis 11/30/2019   Abnormal EKG 11/30/2019   Prediabetes 11/30/2019   Dizziness 11/08/2019   Balance disorder  10/03/2019   Sciatica neuralgia 10/01/2019   Tubular adenoma of colon 11/21/2018   Multiple thyroid  nodules 10/30/2018   Hyperthyroidism 10/30/2018   Tricuspid valve insufficiency, non-rheumatic 10/30/2018   Coronary artery disease due to lipid rich plaque 10/30/2018   Tobacco abuse 10/30/2018   Urge and stress incontinence 05/04/2014    PCP: [name]   REFERRING PROVIDER: [name]   REFERRING DIAG: Weakness of Anal Sphincter    Rationale for Evaluation and Treatment Rehabilitation  THERAPY DIAG:  Sacrococcygeal disorders, not elsewhere classified  Other abnormalities of gait and mobility  Other low back pain  ONSET DATE:   SUBJECTIVE:         SUBJECTIVE STATEMENT TODAY:   Pt returns to 2nd visit with questions about her patient note                                                                                                                                                                                    SUBJECTIVE STATEMENT ON EVAL  12/01/23  1) urge incontinence : Pt wears a Depends when traveling. Pt has to drink 64 fl oz water a day and aim for 80 fl oz due to NPH.   Pt seeks public restrooms once an hour.   When not traveling pt does not wear a pantyliner.  Pt arrives home in her garage and the moment she opens the door., she has leakage before making it to the bathroom. This occurs 60% of the time   2)  fecal leakage: pt is not aware when it occurs . Pt has stool type 6-7 / bowel movements occurs daily. Straining across 10% of the time .   3) LBP radiating to posterior L midthigh:  Pt has to put ice on it to go to sleep once a month, 3 nights.. Sometimes it is 8/10 with traveling and carrying bag over her shoulder, lifting groceries. suitcase, objects from floor, lifting mattress to make her bed.   Pain eases with massage and stretches.   Pt missed a month of walking due to her back . Pt typically walks 1.2 miles.  Pt is not stretching after walking   Enjoys Chair yoga , walking 1.2 miles,   Pt used to do tai chi but she has concerns about falling due to NPH .  Pt has not been back since her ventriculoperitoneal shunt placement.    PERTINENT HISTORY:  Ventriculoperitoneal shunt (Right) due to  normal pressure hydrocephalus   PAIN:  Are you having pain? Yes: see above   PRECAUTIONS:  Normal pressure hydrocephalus:   Pt cannot go near magnets/ MRI  . Pt gets dizziness with leaning forward /  gardening. Pt  avoids gardening   WEIGHT BEARING RESTRICTIONS:   No   FALLS:  Has patient fallen in last 6 months? No   LIVING ENVIRONMENT: Lives with: alone  Lives in: [address] community  Stairs: in [place], one story, in [place], two stories    OCCUPATION: retired Runner, broadcasting/film/video   PLOF: IND   PATIENT GOALS:   Not having to go into the bathroom when she walks into the house. Be able to walk 30-60 min , be IND   OBJECTIVE:    [facility] PT Assessment - 12/16/23 0001       Palpation   SI assessment  R shoulder/ R pelvis lowered    Palpation comment tightness along interspinals, intercostals T10- 12 R,  limited anterior excursion of R ribs      Bed Mobility   Bed Mobility --   required multiple practice rounds for sit <>supine, scooting           [facility] Adult PT Treatment/Exercise - 12/16/23 1126       Self-Care   Self-Care Other Self-Care Comments    Other Self-Care Comments  explained and showed anatomy images of dipahragm and today's manual Tx to help lengthen spine realign spine and help improve anterior diaphragm excursion on R , answered pt's questions,    explained the importance of transfers in a sequential way, with pauses between changes in positions moving against gravity  to minimize quick changes in intra-cranial pressure: given Hx of  NPH      Neuro Re-ed    Neuro Re-ed Details  cued for logrolling, scooting in bed without lifting head to minimize strianing pelvic floor, cued for zig zag stretch and thoracic mobility stretches but only adding zig zag stretch into  HEP      Modalities   Modalities Moist Heat      Moist Heat Therapy   Moist Heat Location --   throacic/ with lower trunk rotation to promote anterior excursion of R diaphragm  , guided relaxation)     Manual Therapy   Manual therapy comments STM/ MWM to address probelm areas noted in assessment to help realign spine, optimize diaphragm excursion            HOME EXERCISE PROGRAM: See pt  instruction section    ASSESSMENT:  CLINICAL IMPRESSION:  Today addressed pt's questions  Addressed pt's uneven pelvic girdle and shoulder height, tightness along interspinals, intercostals T10- 12 R,  limited anterior excursion of R ribs. Post Tx, pt demo'd improved anterior/ lateral excursion of diaphragm which will help pt improve her IAP system which will help with her goals.    Cued for body mechanics training with log rolling and scooting in bed to help with a more effective intraabdominal pressure system to minimize Sx.   Regional interdependent approaches will yield greater benefits in pt's POC.  Pt w/ ventriculoperitoneal shunt (Right) due to  normal pressure hydrocephalus and plan to use semi reclined positions in lieu of supine if needed.  Provided education to avoid any exercise/ yoga poses where hips are higher than heart which can cause increased intracranial pressure to the gravity.  Explained the importance of transfers in a sequential way, with pauses between changes in positions moving against gravity  to minimize quick changes in intra-cranial pressure: given Hx of  NPH.  Provided  instruction to pause between sidelying to sit and pause with feet on the floor then rise to standing after pause.   However, pt had reported dizziness upon sit  to stand after quick transfer from sidelying despite given instruction to pause and sit before standing.     Plan to continue to  address realignment of spine/ pelvis at next session to help promote optimize IAP system for improved pelvic floor function, trunk stability, gait, balance, stabilization with mobility tasks.  Plan to address pelvic floor issues once pelvis and spine are realigned to yield better outcomes.                                                       Pt benefits from skilled PT.    OBJECTIVE IMPAIRMENTS decreased activity tolerance, decreased coordination, decreased endurance, decreased mobility, difficulty walking,  decreased ROM, decreased strength, decreased safety awareness, hypomobility, increased muscle spasms, impaired flexibility, improper body mechanics, postural dysfunction, and pain.   ACTIVITY LIMITATIONS  self-care,   home chores, work tasks    PARTICIPATION LIMITATIONS:  community, walking activities    PERSONAL FACTORS   affecting patient's functional outcome:    REHAB POTENTIAL: Good   CLINICAL DECISION MAKING: Evolving/moderate complexity   EVALUATION COMPLEXITY: Moderate    PATIENT EDUCATION:    Education details: Showed pt anatomy images. Explained muscles attachments/ connection, physiology of deep core system/ spinal- thoracic-pelvis-lower kinetic chain as they relate to pt's presentation, Sx, and past Hx. Explained what and how these areas of deficits need to be restored to balance and function    See Therapeutic activity / neuromuscular re-education section  Answered pt's questions.   Person educated: Patient Education method: Explanation, Demonstration, Tactile cues, Verbal cues, and Handouts Education comprehension: verbalized understanding, returned demonstration, verbal cues required, tactile cues required, and needs further education     PLAN: PT FREQUENCY: 1x/week   PT DURATION: 10 weeks   PLANNED INTERVENTIONS:   Gait training;Stair training;Functional mobility training;DME Instruction;Therapeutic activities;Therapeutic exercise;Balance training;Neuromuscular re-education;Patient/family education;Vestibular;Visual/perceptual remediation/compensation;Passive range of motion;Moist Heat;Cryotherapy;Traction;Canalith Repostioning;Joint Manipulations;Manual lymph drainage;Manual techniques;Scar mobilization;Energy conservation;Dry needling;ADLs/Self Care Home Management;Biofeedback;Electrical Stimulation;Taping    PLAN FOR NEXT SESSION: See clinical impression for plan     GOALS: Goals reviewed with patient? Yes  SHORT TERM GOALS: Target date:  12/29/2023      Pt will demo IND with HEP                    Baseline: Not IND            Goal status: INITIAL   LONG TERM GOALS: Target date: 02/09/2024    1.Pt will demo proper deep core coordination without chest breathing and optimal excursion of diaphragm/pelvic floor in order to promote spinal stability and pelvic floor function  Baseline: dyscoordination Goal status: INITIAL  2.  Pt will demo proper body mechanics in against gravity tasks and ADLs  work tasks, fitness  to minimize straining pelvic floor / back    Baseline: not IND, improper form that places strain on pelvic floor  Goal status: INITIAL    3. Pt will demo reciprocal gait pattern, longer stride length  in order to ambulate safely in community and return to fitness routine , possibly return to tai chi Baseline:  armswings present, heel striking, minimal knee /hip flexion  Goal status: INITIAL    4. Pt will demo levelled pelvic girdle and shoulder height in order to progress to deep core strengthening HEP and restore mobility at spine, pelvis,  gait, posture minimize falls, and improve balance  Baseline: R shoulder/ R pelvis lowered  Goal status: INITIAL   5. Pt will improve PFDI-7 questionnaire to  pts  score change  to demo improved QOL  Baseline:    ( greater pts indicate greater negative impact on QOL)  57   pts  ( total) 27   pts  ( UIQ-7 ) 27   pts  ( CRAIQ-7 ) 0   pts  ( POPIQ-7 )   Goal status: INITIAL  6. Pt will report no longer seeking bathroom in public restrooms every hour and instead, able to go once every 2 hours in order to shop and travel  Baseline:Pt wears a Depends when traveling. Pt has to drink 64 fl oz water a day and aim for 80 fl oz due to NPH.   Pt seeks public restrooms once an hour.   When not traveling pt does not wear a pantyliner.  Pt arrives home in her garage and the moment she opens the door., she has leakage before making it to the bathroom. This occurs 60% of the  time  Goal status: INITIAL  7. Pt will report increased Type 4 stool consistency to >25% of the time and no more straining . Pt will report less fecal leakage by 50% of the time  Baseline: . Pt has stool type 6-7 / bowel movements occurs daily. Straining across 10% of the time .  Goal status: INITIAL  8. Pt will report no leakage upon arriving in garage and able to to make it to the toilet before leakage across  > 50%  of the time  Baseline:Pt arrives home in her garage and the moment she opens the door., she has leakage before making it to the bathroom. This leakage  occurs 60% of the time  Goal status: INITIAL  Pia Lupe Plump, PT 12/16/2023, 11:36 AM

## 2023-12-22 ENCOUNTER — Ambulatory Visit: Admitting: Physical Therapy

## 2023-12-22 DIAGNOSIS — M533 Sacrococcygeal disorders, not elsewhere classified: Secondary | ICD-10-CM

## 2023-12-23 NOTE — Therapy (Signed)
 OUTPATIENT PHYSICAL THERAPY TREATMENT    Patient Name: Martha Wilson MRN: [id number] DOB:[birth year], 73 y.o., female Today's Date: 12/23/2023   PT End of Session - 12/22/23 1114     Visit Number 3    Number of Visits 10    Date for PT Re-Evaluation 02/09/24    PT Start Time 1100    PT Stop Time 1150    PT Time Calculation (min) 50 min    Activity Tolerance Patient tolerated treatment well;No increased pain    Behavior During Therapy WFL for tasks assessed/performed          Past Medical History:  Diagnosis Date   Allergies    Arthritis    right knee   Headache    History of methicillin resistant staphylococcus aureus (MRSA)    Hyperthyroidism    MVP (mitral valve prolapse)    Normal pressure hydrocephalus (HCC)    Osteopenia    Palpitations    stress related   Skin cancer    BASAL CELL   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   by [facility] for NPH   Tricuspid valve prolapse    Wears dentures    partial upper and lower   Past Surgical History:  Procedure Laterality Date   APPLICATION OF CRANIAL NAVIGATION N/A 05/31/2020   Procedure: APPLICATION OF CRANIAL NAVIGATION;  Surgeon: Clois Reeves, MD;  Location: [facility];  Service: Neurosurgery;  Laterality: N/A;   BREAST BIOPSY  1980's   pt states she had a bx in the 80's, not sure of side, benign   breat biopsy     CATARACT EXTRACTION W/PHACO Right 09/22/2018   Procedure: CATARACT EXTRACTION PHACO AND INTRAOCULAR LENS PLACEMENT (IOC)  RIGHT panooptix;  Surgeon: Jaye Fallow, MD;  Location: [facility];  Service: Ophthalmology;  Laterality: Right;   CATARACT EXTRACTION W/PHACO Left 10/06/2018   Procedure: CATARACT EXTRACTION PHACO AND INTRAOCULAR LENS PLACEMENT (IOC) LEFT PANOPTIX LENS;  Surgeon: Jaye Fallow, MD;  Location: [facility];  Service: Ophthalmology;  Laterality: Left;   COLONOSCOPY     COLONOSCOPY WITH PROPOFOL  N/A 11/19/2018   Procedure:  COLONOSCOPY WITH PROPOFOL ;  Surgeon: Therisa Bi, MD;  Location: [facility];  Service: Gastroenterology;  Laterality: N/A;   COLONOSCOPY WITH PROPOFOL  N/A 09/21/2021   Procedure: COLONOSCOPY WITH PROPOFOL ;  Surgeon: Therisa Bi, MD;  Location: [facility];  Service: Gastroenterology;  Laterality: N/A;   ORIF FOOT FRACTURE     VENTRICULOPERITONEAL SHUNT Right 05/31/2020   Procedure: SHUNT INSERTION VENTRICULAR-PERITONEAL;  Surgeon: Clois Reeves, MD;  Location: [facility];  Service: Neurosurgery;  Laterality: Right;  Dr Tye to assist   Patient Active Problem List   Diagnosis Date Noted   Urinary and fecal incontinence 10/21/2023   Diarrhea 07/05/2023   Macrocytosis 04/22/2023   Post-menopause atrophic vaginitis 04/22/2023   Aortic atherosclerosis (HCC) 04/22/2023   Dysuria 03/24/2023   Positive self-administered antigen test for COVID-19 11/11/2022   Neck pain without injury 10/05/2022   Skin cancer of arm 10/05/2022   Urinary frequency 10/05/2022   CKD stage 3a, GFR 45-59 ml/min (HCC) 10/04/2022   Nausea 06/26/2022   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 03/19/2022   Osteoporosis 02/19/2022   Cardiomyopathy (HCC) 06/25/2021   Pulmonary nodule, right 06/25/2021   Tinnitus aurium, bilateral 12/26/2020   Status post ventriculoperitoneal shunt 06/21/2020   NPH (normal pressure hydrocephalus) (HCC) 05/31/2020   Orthostasis 11/30/2019   Abnormal EKG 11/30/2019   Prediabetes 11/30/2019   Dizziness 11/08/2019   Balance disorder  10/03/2019   Sciatica neuralgia 10/01/2019   Tubular adenoma of colon 11/21/2018   Multiple thyroid  nodules 10/30/2018   Hyperthyroidism 10/30/2018   Tricuspid valve insufficiency, non-rheumatic 10/30/2018   Coronary artery disease due to lipid rich plaque 10/30/2018   Tobacco abuse 10/30/2018   Urge and stress incontinence 05/04/2014    PCP: [name]   REFERRING PROVIDER: [name]   REFERRING DIAG: Weakness of Anal Sphincter    Rationale for Evaluation and Treatment Rehabilitation  THERAPY DIAG:  Sacrococcygeal disorders, not elsewhere classified  ONSET DATE:   SUBJECTIVE:         SUBJECTIVE STATEMENT TODAY:   Pt has been practicing logrolling and zig zag stretch. Pt reported feeling relaxed after last session                                                                                                                                                                             SUBJECTIVE STATEMENT ON EVAL  12/01/23  1) urge incontinence : Pt wears a Depends when traveling. Pt has to drink 64 fl oz water a day and aim for 80 fl oz due to NPH.   Pt seeks public restrooms once an hour.   When not traveling pt does not wear a pantyliner.  Pt arrives home in her garage and the moment she opens the door., she has leakage before making it to the bathroom. This occurs 60% of the time   2)  fecal leakage: pt is not aware when it occurs . Pt has stool type 6-7 / bowel movements occurs daily. Straining across 10% of the time .   3) LBP radiating to posterior L midthigh:  Pt has to put ice on it to go to sleep once a month, 3 nights.. Sometimes it is 8/10 with traveling and carrying bag over her shoulder, lifting groceries. suitcase, objects from floor, lifting mattress to make her bed.   Pain eases with massage and stretches.   Pt missed a month of walking due to her back . Pt typically walks 1.2 miles.  Pt is not stretching after walking   Enjoys Chair yoga , walking 1.2 miles,   Pt used to do tai chi but she has concerns about falling due to NPH .  Pt has not been back since her ventriculoperitoneal shunt placement.    PERTINENT HISTORY:  Ventriculoperitoneal shunt (Right) due to  normal pressure hydrocephalus   PAIN:  Are you having pain? Yes: see above   PRECAUTIONS:  Normal pressure hydrocephalus:   Pt cannot go near magnets/ MRI  . Pt gets dizziness with leaning forward / gardening. Pt avoids gardening    WEIGHT BEARING RESTRICTIONS:   No   FALLS:  Has patient fallen in last 6 months? No   LIVING ENVIRONMENT: Lives with: alone  Lives in: [address] community  Stairs: in [place], one story, in [place], two stories    OCCUPATION: retired Runner, broadcasting/film/video   PLOF: IND   PATIENT GOALS:   Not having to go into the bathroom when she walks into the house. Be able to walk 30-60 min , be IND   OBJECTIVE:     [facility] PT Assessment - 12/23/23 1409       Palpation   SI assessment  pelvis levelled, R shoulder lowered,    Palpation comment tightness along  R interspinals, intercostals, R to promote lateral anterior excursion of diaphragm      Bed Mobility   Bed Mobility --   difficulty with logrolling, scooting in bed, difficulty sensing her placement at midline of plinth         [facility] Adult PT Treatment/Exercise - 12/23/23 1408       Self-Care   Self-Care Other Self-Care Comments    Other Self-Care Comments  explained      Neuro Re-ed    Neuro Re-ed Details  cued for  motor planning, segmenta movement coordination, and propioception logrolling, scooting in bed without lifting head to minimize strianing pelvic floor, cued for zig zag stretch alignment and propioception because she had practiced it incorrectly by moving her shoulders instead of scooting hips     Modalities   Modalities Moist Heat      Moist Heat Therapy   Number Minutes Moist Heat 4 Minutes    Moist Heat Location --   throacic/ with lower trunk rotation to promote R anterior excursion of R diaphragm  , guided relaxation)     Manual Therapy   Manual therapy comments STM/ MWM to address problem areas noted in assessment to help realign spine, optimize diaphragm excursion             HOME EXERCISE PROGRAM: See pt instruction section    ASSESSMENT:  CLINICAL IMPRESSION:  Pt showed levelled pelvis but still required manual Tx to address lowered R shoulder. Manual Tx was applied to R interspinals, intercostals  to promote lateral & anterior excursion of diaphragm.  Plan to continue to  address realignment of spine/ pelvis at next session and then progress to deep core training.  Cued for  motor planning, segmenta movement coordination, and propioception with logrolling, scooting in bed without lifting head to minimize strianing pelvic floor which will help minimize downward straining of pelvic issues. Cued for zig zag stretch alignment and propioception because she had practiced it incorrectly by moving her shoulders instead of scooting hips  Regional interdependent approaches will yield greater benefits in pt's POC.  Pt w/ ventriculoperitoneal shunt (Right) due to  normal pressure hydrocephalus and plan to use semi reclined positions in lieu of supine if needed.  Provided education to avoid any exercise/ yoga poses where hips are higher than heart which can cause increased intracranial pressure to the gravity.    Explained the importance of transfers in a sequential way, with pauses between changes in positions moving against gravity  to minimize quick changes in intra-cranial pressure: given Hx of  NPH.    Anticipate pt's improved posture will help promote optimize IAP system for improved pelvic floor function, trunk stability, gait, balance, stabilization with mobility tasks.  Plan to address pelvic floor issues once pelvis and spine are realigned to yield better outcomes.  Pt benefits from skilled PT.    OBJECTIVE IMPAIRMENTS decreased activity tolerance, decreased coordination, decreased endurance, decreased mobility, difficulty walking, decreased ROM, decreased strength, decreased safety awareness, hypomobility, increased muscle spasms, impaired flexibility, improper body mechanics, postural dysfunction, and pain.   ACTIVITY LIMITATIONS  self-care,   home chores, work tasks    PARTICIPATION LIMITATIONS:  community, walking activities     PERSONAL FACTORS   affecting patient's functional outcome:    REHAB POTENTIAL: Good   CLINICAL DECISION MAKING: Evolving/moderate complexity   EVALUATION COMPLEXITY: Moderate    PATIENT EDUCATION:    Education details: Showed pt anatomy images. Explained muscles attachments/ connection, physiology of deep core system/ spinal- thoracic-pelvis-lower kinetic chain as they relate to pt's presentation, Sx, and past Hx. Explained what and how these areas of deficits need to be restored to balance and function    See Therapeutic activity / neuromuscular re-education section  Answered pt's questions.   Person educated: Patient Education method: Explanation, Demonstration, Tactile cues, Verbal cues, and Handouts Education comprehension: verbalized understanding, returned demonstration, verbal cues required, tactile cues required, and needs further education     PLAN: PT FREQUENCY: 1x/week   PT DURATION: 10 weeks   PLANNED INTERVENTIONS:   Gait training;Stair training;Functional mobility training;DME Instruction;Therapeutic activities;Therapeutic exercise;Balance training;Neuromuscular re-education;Patient/family education;Vestibular;Visual/perceptual remediation/compensation;Passive range of motion;Moist Heat;Cryotherapy;Traction;Canalith Repostioning;Joint Manipulations;Manual lymph drainage;Manual techniques;Scar mobilization;Energy conservation;Dry needling;ADLs/Self Care Home Management;Biofeedback;Electrical Stimulation;Taping    PLAN FOR NEXT SESSION: See clinical impression for plan     GOALS: Goals reviewed with patient? Yes  SHORT TERM GOALS: Target date: 12/29/2023      Pt will demo IND with HEP                    Baseline: Not IND            Goal status: INITIAL   LONG TERM GOALS: Target date: 02/09/2024    1.Pt will demo proper deep core coordination without chest breathing and optimal excursion of diaphragm/pelvic floor in order to promote spinal stability and  pelvic floor function  Baseline: dyscoordination Goal status: INITIAL  2.  Pt will demo proper body mechanics in against gravity tasks and ADLs  work tasks, fitness  to minimize straining pelvic floor / back    Baseline: not IND, improper form that places strain on pelvic floor  Goal status: INITIAL    3. Pt will demo reciprocal gait pattern, longer stride length  in order to ambulate safely in community and return to fitness routine , possibly return to tai chi Baseline:  armswings present, heel striking, minimal knee /hip flexion  Goal status: INITIAL    4. Pt will demo levelled pelvic girdle and shoulder height in order to progress to deep core strengthening HEP and restore mobility at spine, pelvis, gait, posture minimize falls, and improve balance  Baseline: R shoulder/ R pelvis lowered  Goal status: INITIAL   5. Pt will improve PFDI-7 questionnaire to  pts  score change  to demo improved QOL  Baseline:    ( greater pts indicate greater negative impact on QOL)  57   pts  ( total) 27   pts  ( UIQ-7 ) 27   pts  ( CRAIQ-7 ) 0   pts  ( POPIQ-7 )   Goal status: INITIAL  6. Pt will report no longer seeking bathroom in public restrooms every hour and instead, able to go once every 2 hours in order to shop and travel  Baseline:Pt wears a Depends when  traveling. Pt has to drink 64 fl oz water a day and aim for 80 fl oz due to NPH.   Pt seeks public restrooms once an hour.   When not traveling pt does not wear a pantyliner.  Pt arrives home in her garage and the moment she opens the door., she has leakage before making it to the bathroom. This occurs 60% of the time  Goal status: INITIAL  7. Pt will report increased Type 4 stool consistency to >25% of the time and no more straining . Pt will report less fecal leakage by 50% of the time  Baseline: . Pt has stool type 6-7 / bowel movements occurs daily. Straining across 10% of the time .  Goal status: INITIAL  8. Pt will report no  leakage upon arriving in garage and able to to make it to the toilet before leakage across  > 50%  of the time  Baseline:Pt arrives home in her garage and the moment she opens the door., she has leakage before making it to the bathroom. This leakage  occurs 60% of the time  Goal status: INITIAL  Pia Lupe Plump, PT 12/23/2023, 2:12 PM

## 2023-12-26 LAB — HM DIABETES EYE EXAM

## 2023-12-31 ENCOUNTER — Ambulatory Visit: Admitting: Physical Therapy

## 2023-12-31 DIAGNOSIS — M5459 Other low back pain: Secondary | ICD-10-CM

## 2023-12-31 DIAGNOSIS — R2689 Other abnormalities of gait and mobility: Secondary | ICD-10-CM

## 2023-12-31 DIAGNOSIS — M533 Sacrococcygeal disorders, not elsewhere classified: Secondary | ICD-10-CM

## 2023-12-31 NOTE — Patient Instructions (Signed)
 Lengthen Back rib by L  shoulder ( winging)  Lie on R  side , pillow between knees and under head  Pull  L arm overhead over mattress, grab the edge of mattress,pull it upward, drawing elbow away from ears  Breathing 10 reps Brushing arm with 3/4 turn onto pillow behind back  Lying on R  side ,Pillow/ Block between knees  dragging top forearm across ribs below breast rotating 3/4 turn,  rotating  _L_ only this week ,  relax onto the pillow behind the back  and then back to other palm , maintain top palm on body whole top and not lift shoulder Do this side this week       Wait do both sides until we have levelled out your spine and shoulders ___ Lengthen Back rib by R  shoulder   ( winging)  Lie on L  side , pillow between knees and under head  Pull  R arm overhead over mattress, grab the edge of mattress,pull it upward, drawing elbow away from ears  Breathing 10 reps Brushing arm with 3/4 turn onto pillow behind back  Lying on L  side ,Pillow/ Block between knees  dragging top forearm across ribs below breast rotating 3/4 turn,  rotating  _R_ only this week ,  relax onto the pillow behind the back  and then back to other palm , maintain top palm on body whole top and not lift shoulder Do this side this week       Wait do both sides until we have levelled out your spine
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
## 2024-01-05 ENCOUNTER — Ambulatory Visit: Admitting: Physical Therapy

## 2024-01-05 DIAGNOSIS — R2689 Other abnormalities of gait and mobility: Secondary | ICD-10-CM

## 2024-01-05 DIAGNOSIS — M533 Sacrococcygeal disorders, not elsewhere classified: Secondary | ICD-10-CM

## 2024-01-05 DIAGNOSIS — M5459 Other low back pain: Secondary | ICD-10-CM

## 2024-01-05 NOTE — Patient Instructions (Signed)
 Stretches :   Neck / shoulder stretches:    Lying on back - small sushi roll towel under neck  _ 6 directions   Chin up, down Rotation like changing lanes when driving Ear to shoulder like puppy dog   10 reps    _angel wings, lower elbows down , keep arms touching bed  10 reps  ____________ Lengthen Back rib by L  shoulder ( winging)  Lie on R  side , pillow between knees and under head  Pull  L arm overhead over mattress, grab the edge of mattress,pull it upward, drawing elbow away from ears  Breathing 10 reps Brushing arm with 3/4 turn onto pillow behind back  Lying on R  side ,Pillow/ Block between knees  dragging top forearm across ribs below breast rotating 3/4 turn,  rotating  _L_ only this week ,  relax onto the pillow behind the back  and then back to other palm , maintain top palm on body whole top and not lift shoulder Do this side this week      do both sides until we have levelled out your spine and shoulders ___   ____________  ZigZag stretch  Reclined twist for hips and side of the hips/ legs  Lay on your back, knees bend Scoot hips to the R , leave shoulders in place Wobble knees to the L side 45 deg and to midline  10 reps      Motion is lotion  - good morning and evening     On your side - winging and brushing   Pillow between knees and behind back     On your back with towel roll under the neck  - neck 6 directions - angel wings - ( dragging arms)  - ZigZag stretch scoot hips to one side and rock knees to opposite, arms by side ,palms up      On your other side - winging and brushing   Pillow between knees and behind back
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
## 2024-01-15 ENCOUNTER — Ambulatory Visit: Admitting: Physical Therapy

## 2024-01-21 ENCOUNTER — Ambulatory Visit: Admitting: Internal Medicine

## 2024-01-21 ENCOUNTER — Encounter: Payer: Self-pay | Admitting: Internal Medicine

## 2024-01-21 VITALS — BP 92/62 | HR 78 | Ht 65.0 in | Wt 120.0 lb

## 2024-01-21 DIAGNOSIS — N1831 Chronic kidney disease, stage 3a: Secondary | ICD-10-CM

## 2024-01-21 DIAGNOSIS — Z87891 Personal history of nicotine dependence: Secondary | ICD-10-CM

## 2024-01-21 DIAGNOSIS — R2689 Other abnormalities of gait and mobility: Secondary | ICD-10-CM

## 2024-01-21 DIAGNOSIS — R350 Frequency of micturition: Secondary | ICD-10-CM | POA: Diagnosis not present

## 2024-01-21 DIAGNOSIS — I951 Orthostatic hypotension: Secondary | ICD-10-CM

## 2024-01-21 DIAGNOSIS — J42 Unspecified chronic bronchitis: Secondary | ICD-10-CM

## 2024-01-21 DIAGNOSIS — R7303 Prediabetes: Secondary | ICD-10-CM

## 2024-01-21 DIAGNOSIS — Z1231 Encounter for screening mammogram for malignant neoplasm of breast: Secondary | ICD-10-CM

## 2024-01-21 DIAGNOSIS — G912 (Idiopathic) normal pressure hydrocephalus: Secondary | ICD-10-CM

## 2024-01-21 DIAGNOSIS — E0590 Thyrotoxicosis, unspecified without thyrotoxic crisis or storm: Secondary | ICD-10-CM

## 2024-01-21 DIAGNOSIS — I950 Idiopathic hypotension: Secondary | ICD-10-CM | POA: Diagnosis not present

## 2024-01-21 DIAGNOSIS — J439 Emphysema, unspecified: Secondary | ICD-10-CM

## 2024-01-21 LAB — URINALYSIS, ROUTINE W REFLEX MICROSCOPIC
Bilirubin Urine: NEGATIVE
Ketones, ur: NEGATIVE
Leukocytes,Ua: NEGATIVE
Nitrite: NEGATIVE
Specific Gravity, Urine: 1.015 (ref 1.000–1.030)
Total Protein, Urine: NEGATIVE
Urine Glucose: NEGATIVE
Urobilinogen, UA: 0.2 (ref 0.0–1.0)
pH: 5.5 (ref 5.0–8.0)

## 2024-01-21 NOTE — Patient Instructions (Signed)
 Your low blood pressure may be the cause of your malaise.  Please return for EARLY MORNING LABS  so I can include a screening test for adrenal insufficiency  I want you to allow a little salt in your diet ,  to help support (bring up) your blood pressure

## 2024-01-21 NOTE — Progress Notes (Unsigned)
 Subjective:  Patient ID: Martha Wilson, female    DOB: 03-15-1951  Age: 73 y.o. MRN: [id number]  CC: The primary encounter diagnosis was Encounter for screening mammogram for malignant neoplasm of breast. Diagnoses of Urinary frequency, Idiopathic hypotension, Prediabetes, Balance disorder, CKD stage 3a, GFR 45-59 ml/min (HCC), Hyperthyroidism, Chronic bronchitis, unspecified chronic bronchitis type (HCC), Emphysema of lung (HCC), History of tobacco abuse, NPH (normal pressure hydrocephalus) (HCC), and Orthostasis were also pertinent to this visit.   HPI Martha Wilson presents for  Chief Complaint  Patient presents with   Medical Management of Chronic Issues    3 month follow up    73 yr old female s/p V/P shunt August 2022 for NPH , aortic atherosclerosis, CAD by cardiac CT, dilated cardiomyopathy and emphysema presents for follow up  Cc:  recent onset  of morning  malaise,  feels off  , light headed,  not vertigo.  Symptoms have kept  her from walking in the morning. Daily for the past several weeks.  Feeling is Gone by 11 am   bp is low today .  Appetite is fine,  eats an omelot every morning ; howver change in diet has resulted in wt loss of 15 lbs  in the past year. .   She has been woken up  every night she wears a CGM with a low BS.  Drinks a minimum of 64 ounces of water daily, often 80 ounces daily  Frequent urination  for the last several days. Urgency    Seeing nephrology in  [facility]  Dr Beverley  [facility]       Outpatient Medications Prior to Visit  Medication Sig Dispense Refill   acetaminophen  (TYLENOL ) 500 MG tablet Take 1,000 mg by mouth every 6 (six) hours as needed for moderate pain or headache.     aspirin  EC 81 MG tablet Take 1 tablet (81 mg total) by mouth daily. Swallow whole. 30 tablet 5   atorvastatin  (LIPITOR) 20 MG tablet Take 1 tablet (20 mg total) by mouth daily. 90 tablet 1   cholecalciferol (VITAMIN D3) 25 MCG (1000 UNIT) tablet  Take 2,000 Units by mouth daily.     ciclopirox  (PENLAC ) 8 % solution APPLY TOPICALLY AT BEDTIME. APPLY OVER NAIL AND SURROUNDING SKIN. APPLY DAILY OVER PREVIOUS COAT. AFTER SEVEN (7) DAYS, MAY REMOVE WITH ALCOHOL AND CONTINUE CYCLE. 6.6 mL 0   Cyanocobalamin  (B-12) 2500 MCG TABS Take 2,500 mcg by mouth daily.     cycloSPORINE (RESTASIS) 0.05 % ophthalmic emulsion 1 drop 2 (two) times daily.     fluticasone (FLONASE) 50 MCG/ACT nasal spray Place 2 sprays into both nostrils daily.     hydrocortisone 2.5 % cream Apply 1 Application topically as needed.     methimazole  (TAPAZOLE ) 5 MG tablet Take 0.5 tablets (2.5 mg total) by mouth at bedtime. 90 tablet 1   Multiple Vitamins-Minerals (PRESERVISION AREDS 2) CAPS Take 1 capsule by mouth 2 (two) times daily.     valACYclovir  (VALTREX ) 500 MG tablet TAKE 1 TABLET (500 MG TOTAL) BY MOUTH DAILY. 90 tablet 1   Estradiol  (YUVAFEM ) 10 MCG TABS vaginal tablet Place 1 tablet (10 mcg total) vaginally 2 (two) times a week. (Patient not taking: Reported on 01/21/2024) 26 tablet 3   calcium  carbonate (OSCAL) 1500 (600 Ca) MG TABS tablet Take 1,800 mg by mouth daily with breakfast. (Patient not taking: Reported on 10/20/2023)     Facility-Administered Medications Prior to Visit  Medication Dose Route Frequency Provider Last  Rate Last Admin   denosumab  (PROLIA ) injection 60 mg  60 mg Subcutaneous Q6 months Osa Campoli L, MD   60 mg at 06/02/23 9049   denosumab  (PROLIA ) injection 60 mg  60 mg Subcutaneous Q6 months Eilidh Marcano L, MD   60 mg at 12/01/23 0953   [START ON 06/02/2024] denosumab  (PROLIA ) injection 60 mg  60 mg Subcutaneous Q6 months [name] Verneita CROME, MD        Review of Systems;  Patient denies headache, fevers, , unintentional weight loss, skin rash, eye pain, sinus congestion and sinus pain, sore throat, dysphagia,  hemoptysis , cough, dyspnea, wheezing, chest pain, palpitations, orthopnea, edema, abdominal pain, nausea, melena, diarrhea,  constipation, flank pain, dysuria, hematuria, , numbness, tingling, seizures,  Focal weakness, Loss of consciousness,  Tremor, insomnia, depression, anxiety, and suicidal ideation.      Objective:  BP 92/62   Pulse 78   Ht 5' 5 (1.651 m)   Wt 120 lb (54.4 kg)   SpO2 97%   BMI 19.97 kg/m   BP Readings from Last 3 Encounters:  01/21/24 92/62  10/20/23 100/60  06/26/23 136/70    Wt Readings from Last 3 Encounters:  01/21/24 120 lb (54.4 kg)  10/20/23 127 lb (57.6 kg)  06/26/23 132 lb (59.9 kg)    Physical Exam Vitals reviewed.  Constitutional:      General: She is not in acute distress.    Appearance: Normal appearance. She is normal weight. She is not ill-appearing, toxic-appearing or diaphoretic.  HENT:     Head: Normocephalic.  Eyes:     General: No scleral icterus.       Right eye: No discharge.        Left eye: No discharge.     Conjunctiva/sclera: Conjunctivae normal.  Cardiovascular:     Rate and Rhythm: Normal rate and regular rhythm.     Pulses: Normal pulses.     Heart sounds: Normal heart sounds.     No friction rub.  Pulmonary:     Effort: Pulmonary effort is normal. No respiratory distress.     Breath sounds: Normal breath sounds.  Musculoskeletal:        General: Normal range of motion.  Skin:    General: Skin is warm and dry.  Neurological:     General: No focal deficit present.     Mental Status: She is alert and oriented to person, place, and time. Mental status is at baseline.     Cranial Nerves: No cranial nerve deficit.     Motor: No weakness.     Coordination: Coordination normal.  Psychiatric:        Mood and Affect: Mood normal.        Behavior: Behavior normal.        Thought Content: Thought content normal.        Judgment: Judgment normal.    Lab Results  Component Value Date   HGBA1C 6.1 01/22/2024   HGBA1C 6.2 12/01/2023   HGBA1C 6.1 08/19/2023    Lab Results  Component Value Date   CREATININE 1.03 01/22/2024    CREATININE 0.96 12/01/2023   CREATININE 0.88 08/19/2023    Lab Results  Component Value Date   WBC 6.1 01/22/2024   HGB 15.4 (H) 01/22/2024   HCT 46.2 (H) 01/22/2024   PLT 268.0 01/22/2024   GLUCOSE 81 01/22/2024   CHOL 130 03/31/2023   TRIG 108.0 03/31/2023   HDL 60.30 03/31/2023   LDLDIRECT 51.0 03/31/2023  LDLCALC 48 03/31/2023   ALT 16 01/22/2024   AST 18 01/22/2024   NA 140 01/22/2024   K 4.8 01/22/2024   CL 104 01/22/2024   CREATININE 1.03 01/22/2024   BUN 18 01/22/2024   CO2 26 01/22/2024   TSH 1.97 10/04/2022   HGBA1C 6.1 01/22/2024    MM 3D SCREENING MAMMOGRAM BILATERAL BREAST Result Date: 02/14/2023 CLINICAL DATA:  Screening. EXAM: DIGITAL SCREENING BILATERAL MAMMOGRAM WITH TOMOSYNTHESIS AND CAD TECHNIQUE: Bilateral screening digital craniocaudal and mediolateral oblique mammograms were obtained. Bilateral screening digital breast tomosynthesis was performed. The images were evaluated with computer-aided detection. COMPARISON:  Previous exam(s). ACR Breast Density Category c: The breasts are heterogeneously dense, which may obscure small masses. FINDINGS: There are no findings suspicious for malignancy. IMPRESSION: No mammographic evidence of malignancy. A result letter of this screening mammogram will be mailed directly to the patient. RECOMMENDATION: Screening mammogram in one year. (Code:SM-B-01Y) BI-RADS CATEGORY  1: Negative. Electronically Signed   By: Corean Salter M.D.   On: 02/14/2023 14:51    Assessment & Plan:  .Encounter for screening mammogram for malignant neoplasm of breast -     3D Screening Mammogram, Left and Right; Future  Urinary frequency -     Urinalysis, Routine w reflex microscopic -     Urine Culture  Idiopathic hypotension -     Cortisol; Future -     Comprehensive metabolic panel with GFR; Future -     CBC with Differential/Platelet; Future  Prediabetes Assessment & Plan: Has been wearing a CGM as part if a study .  CBG drops  to 40 in the middle of the night but is INACCURATE  Orders: -     Hemoglobin A1c; Future  Balance disorder Assessment & Plan: Symptoms worse in the morning lately.  Morning cortisol checked due to relative hypotension and NORMAL,  as well as other labs.  will recommend she see neurosurgeon to have her v/p shunt evaluated   CKD stage 3a, GFR 45-59 ml/min (HCC) Assessment & Plan: GFR is < 60 ml/min but  stable.  Continue periodic follow up with [facility].   She has discontinued  use of NSAIDs,  SPE, IFE, and ultrasound ordered and normal.  Lab Results  Component Value Date   CREATININE 1.03 01/22/2024      Hyperthyroidism Assessment & Plan: Managed by [facility],  with methimazole  2.5 mg daily . TSH was 1,47 Sept 2025 per review of records via EPIC portal     Chronic bronchitis, unspecified chronic bronchitis type (HCC)  Emphysema of lung (HCC) Assessment & Plan: SECONDARY TO tobacco abuse.  Asymptomatic without medications    History of tobacco abuse Assessment & Plan: She has been abstinent since 2023   NPH (normal pressure hydrocephalus) (HCC) Assessment & Plan: SHE HAS hannual t follow up with Dr Clois .  But has had a recurrent of .dizziness and morning malaise,  advised to see Dr Clois ASAP    Orthostasis Assessment & Plan: By pulse only.  Exam and labs suggestive of mild dehydration . Recommend that she increase her water intake to 60 ounces daily      I personally spent a total of 31 minutes in the care of the patient today including getting/reviewing separately obtained history, performing a medically appropriate exam/evaluation, counseling and educating, placing orders, referring and communicating with other health care professionals, and independently interpreting results.   Follow-up: Return in about 6 months (around 07/21/2024).   Verneita LITTIE Kettering, MD

## 2024-01-21 NOTE — Assessment & Plan Note (Signed)
 Has been wearing a CGM as part if a study .  CBG drops to 40 in the middle of the night but is INACCURATE

## 2024-01-22 ENCOUNTER — Other Ambulatory Visit

## 2024-01-22 ENCOUNTER — Encounter: Payer: Self-pay | Admitting: Internal Medicine

## 2024-01-22 ENCOUNTER — Telehealth: Payer: Self-pay

## 2024-01-22 ENCOUNTER — Ambulatory Visit: Payer: Self-pay | Admitting: Internal Medicine

## 2024-01-22 DIAGNOSIS — J42 Unspecified chronic bronchitis: Secondary | ICD-10-CM | POA: Insufficient documentation

## 2024-01-22 DIAGNOSIS — I950 Idiopathic hypotension: Secondary | ICD-10-CM

## 2024-01-22 DIAGNOSIS — Z982 Presence of cerebrospinal fluid drainage device: Secondary | ICD-10-CM

## 2024-01-22 DIAGNOSIS — J439 Emphysema, unspecified: Secondary | ICD-10-CM | POA: Insufficient documentation

## 2024-01-22 DIAGNOSIS — R7303 Prediabetes: Secondary | ICD-10-CM

## 2024-01-22 LAB — COMPREHENSIVE METABOLIC PANEL WITH GFR
ALT: 16 U/L (ref 0–35)
AST: 18 U/L (ref 0–37)
Albumin: 4.4 g/dL (ref 3.5–5.2)
Alkaline Phosphatase: 48 U/L (ref 39–117)
BUN: 18 mg/dL (ref 6–23)
CO2: 26 meq/L (ref 19–32)
Calcium: 9.1 mg/dL (ref 8.4–10.5)
Chloride: 104 meq/L (ref 96–112)
Creatinine, Ser: 1.03 mg/dL (ref 0.40–1.20)
GFR: 53.37 mL/min — ABNORMAL LOW (ref >60.00)
Glucose, Bld: 81 mg/dL (ref 70–99)
Potassium: 4.8 meq/L (ref 3.5–5.1)
Sodium: 140 meq/L (ref 135–145)
Total Bilirubin: 0.6 mg/dL (ref 0.2–1.2)
Total Protein: 7.1 g/dL (ref 6.0–8.3)

## 2024-01-22 LAB — CBC WITH DIFFERENTIAL/PLATELET
Basophils Absolute: 0.1 K/uL (ref 0.0–0.1)
Basophils Relative: 1 % (ref 0.0–3.0)
Eosinophils Absolute: 0.1 K/uL (ref 0.0–0.7)
Eosinophils Relative: 2 % (ref 0.0–5.0)
HCT: 46.2 % — ABNORMAL HIGH (ref 36.0–46.0)
Hemoglobin: 15.4 g/dL — ABNORMAL HIGH (ref 12.0–15.0)
Lymphocytes Relative: 33.5 % (ref 12.0–46.0)
Lymphs Abs: 2 K/uL (ref 0.7–4.0)
MCHC: 33.3 g/dL (ref 30.0–36.0)
MCV: 109.9 fl — ABNORMAL HIGH (ref 78.0–100.0)
Monocytes Absolute: 0.6 K/uL (ref 0.1–1.0)
Monocytes Relative: 9.2 % (ref 3.0–12.0)
Neutro Abs: 3.3 K/uL (ref 1.4–7.7)
Neutrophils Relative %: 54.3 % (ref 43.0–77.0)
Platelets: 268 K/uL (ref 150.0–400.0)
RBC: 4.21 Mil/uL (ref 3.87–5.11)
RDW: 13.9 % (ref 11.5–15.5)
WBC: 6.1 K/uL (ref 4.0–10.5)

## 2024-01-22 LAB — URINE CULTURE
MICRO NUMBER:: 17102755
Result:: NO GROWTH
SPECIMEN QUALITY:: ADEQUATE

## 2024-01-22 LAB — CORTISOL: Cortisol, Plasma: 13.2 ug/dL

## 2024-01-22 LAB — HEMOGLOBIN A1C: Hgb A1c MFr Bld: 6.1 % (ref 4.6–6.5)

## 2024-01-22 NOTE — Telephone Encounter (Signed)
 Dr Clois and I received the following message from Dr [name]: hi there. can you ask your office to see Ms Martha Wilson early? she's having morning dizziness and malaise along with increased urinary frequency and hypotension . no adrenal insufficiency or UTI on labs. no headaches, but wonder if shunt needs evaluation, many thanks.  Per Dr Clois, shunt series xrays have been ordered. Appointment has been scheduled for 01/27/24.

## 2024-01-22 NOTE — Assessment & Plan Note (Signed)
 SHE HAS hannual t follow up with Dr Clois .  But has had a recurrent of .dizziness and morning malaise,  advised to see Dr Clois ASAP

## 2024-01-22 NOTE — Assessment & Plan Note (Signed)
 She has been abstinent since 2023

## 2024-01-22 NOTE — Assessment & Plan Note (Signed)
 SECONDARY TO tobacco abuse.  Asymptomatic without medications

## 2024-01-22 NOTE — Assessment & Plan Note (Addendum)
 Managed by [facility],  with methimazole  2.5 mg daily . TSH was 1,47 Sept 2025 per review of records via EPIC portal

## 2024-01-22 NOTE — Assessment & Plan Note (Signed)
 GFR is < 60 ml/min but  stable.  Continue periodic follow up with [facility].   She has discontinued  use of NSAIDs,  SPE, IFE, and ultrasound ordered and normal.  Lab Results  Component Value Date   CREATININE 1.03 01/22/2024

## 2024-01-22 NOTE — Assessment & Plan Note (Signed)
 Symptoms worse in the morning lately.  Morning cortisol checked due to relative hypotension and NORMAL,  as well as other labs.  will recommend she see neurosurgeon to have her v/p shunt evaluated

## 2024-01-22 NOTE — Assessment & Plan Note (Signed)
By pulse only.  Exam and labs suggestive of mild dehydration . Recommend that she increase her water intake to 60 ounces daily

## 2024-01-27 ENCOUNTER — Encounter: Payer: Self-pay | Admitting: Neurosurgery

## 2024-01-27 ENCOUNTER — Ambulatory Visit: Admitting: Neurosurgery

## 2024-01-27 ENCOUNTER — Ambulatory Visit (INDEPENDENT_AMBULATORY_CARE_PROVIDER_SITE_OTHER)

## 2024-01-27 VITALS — BP 104/70 | Ht 65.0 in | Wt 120.0 lb

## 2024-01-27 DIAGNOSIS — G912 (Idiopathic) normal pressure hydrocephalus: Secondary | ICD-10-CM

## 2024-01-27 DIAGNOSIS — Z982 Presence of cerebrospinal fluid drainage device: Secondary | ICD-10-CM | POA: Diagnosis not present

## 2024-01-27 NOTE — Progress Notes (Signed)
    DOS: 05/31/20 - R VPS Codman Hakim  Currently set at 80  HISTORY OF PRESENT ILLNESS:   01/27/2024 Martha Wilson had a recent bout with fatigue and malaise.  She saw her primary doctor and had some low BP and low sugars.    She is feeling better today.  She has occasional headaches which are associated with dehydration she thinks.  Overall, she continues to be doing well.  After this recent bout of malaise and fatigue abated, she feels that she is at her baseline.  04/22/2023 Martha Wilson is doing very well.  She still has some whooshing in her ears but is otherwise well.  03/21/2022  Martha Wilson is status post right ventriculoperitoneal shunt placement. she is doing well.     PHYSICAL EXAMINATION: Vitals:   01/27/24 1129  BP: 104/70    General: Patient is well developed, well nourished, calm, collected, and in no apparent distress.  NEUROLOGICAL: General: In no acute distress  Cranial nerves appear intact  Moves all extremities well  ROS (Neurologic): Negative except as noted above  MEDICAL DECISION-MAKING: Shunt series January 27, 2024 shows common Hakim valve set at 80 without any discontinuities or irregularities  ASSESSMENT/PLAN: Martha Wilson is doing well after VPS.  She is currently stable.  I will see her back in late spring or early summer unless there are new issues.  I am pleased with her continued response to shunt placement.  I spent a total of 10 minutes in both face-to-face and non-face-to-face activities for this visit on the date of this encounter.  Advised to contact the office if any questions or concerns arise.  Kendra Woolford K. Clois MD, MPHS

## 2024-01-28 ENCOUNTER — Ambulatory Visit
Admission: RE | Admit: 2024-01-28 | Discharge: 2024-01-28 | Disposition: A | Discharge status: Home / Self Care | Source: Ambulatory Visit | Attending: Pulmonary Disease | Admitting: Pulmonary Disease

## 2024-01-28 DIAGNOSIS — R911 Solitary pulmonary nodule: Secondary | ICD-10-CM | POA: Diagnosis present

## 2024-02-02 ENCOUNTER — Ambulatory Visit: Payer: Self-pay | Admitting: Pulmonary Disease

## 2024-02-04 ENCOUNTER — Ambulatory Visit: Attending: Internal Medicine | Admitting: Physical Therapy

## 2024-02-04 DIAGNOSIS — M533 Sacrococcygeal disorders, not elsewhere classified: Secondary | ICD-10-CM | POA: Insufficient documentation

## 2024-02-04 DIAGNOSIS — R2689 Other abnormalities of gait and mobility: Secondary | ICD-10-CM | POA: Insufficient documentation

## 2024-02-04 DIAGNOSIS — M5459 Other low back pain: Secondary | ICD-10-CM | POA: Insufficient documentation
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
## 2024-02-04 NOTE — Patient Instructions (Addendum)
   Clam Shell 45 Degrees  Lying with hips and knees bent 45, one pillow between knees and ankles. Heel together, toes apart like ballerina,  Lift knee with exhale while pressing heels together. Be sure pelvis does not roll backward. Do not arch back. Do 15  times, each leg, 2 times per day.    Complimentary stretch:  Hip abductor and hip flexors stretch at the same time    Alternative if hip is tight,   Sit at 45 deg turn with R leg and knee on edge of chair/ bench, L buttock hanging off the edge to bring the L foot back like a lunge, toes bent, lower heel to feel quad stretch,  pay attention to keeping pinky and first toe ballmound planted to align toes forward so ankles are not twerked   Repeat with other side   ___  Lengthen Back rib by L  shoulder ( winging)  Lie on R  side , pillow between knees and under head  Pull  L arm overhead over mattress, grab the edge of mattress,pull it upward, drawing elbow away from ears  Breathing 10 reps Brushing arm with 3/4 turn onto pillow behind back  Lying on R  side ,Pillow/ Block between knees  dragging top forearm across ribs below breast rotating 3/4 turn,  rotating  _L_ only this week ,  relax onto the pillow behind the back  and then back to other palm , maintain top palm on body whole top and not lift shoulder Do this side this week       Wait do both sides until we have levelled out your spine and shoulders ___

## 2024-02-09 ENCOUNTER — Encounter: Payer: Self-pay | Admitting: Nurse Practitioner

## 2024-02-09 ENCOUNTER — Ambulatory Visit: Admitting: Nurse Practitioner

## 2024-02-09 VITALS — BP 122/88 | HR 69 | Temp 97.7°F | Ht 65.0 in | Wt 119.0 lb

## 2024-02-09 DIAGNOSIS — J439 Emphysema, unspecified: Secondary | ICD-10-CM

## 2024-02-09 DIAGNOSIS — F17210 Nicotine dependence, cigarettes, uncomplicated: Secondary | ICD-10-CM | POA: Diagnosis not present

## 2024-02-09 DIAGNOSIS — R911 Solitary pulmonary nodule: Secondary | ICD-10-CM | POA: Diagnosis not present

## 2024-02-09 MED ORDER — VARENICLINE TARTRATE 0.5 MG PO TABS: ORAL_TABLET | ORAL | 0 refills | Status: AC

## 2024-02-09 MED ORDER — VARENICLINE TARTRATE 1 MG PO TABS
1.0000 mg | ORAL_TABLET | Freq: Two times a day (BID) | ORAL | 5 refills | Status: AC
Start: 2024-02-15 — End: ?

## 2024-02-09 NOTE — Patient Instructions (Addendum)
 Repeat CT chest in 6 months for monitoring of the new lung nodule  Work on quitting smoking   We will prescribe you Chantix to help with quitting smoking On days 1-3, take 0.5 mg tablet once daily then on days 4-7 take 0.5 mg tablet Twice daily. After this, you will increase to the maintenance dose which is 1 mg Twice daily and will continue this for a minimum of 12 weeks after you quit smoking Go to the emergency department with new onset chest pain Stop and notify of any mood changes  Follow up in 6 months after the CT chest scan with Dr. Tamea. Follow up with Katie Ansh Fauble,NP in 6 weeks, if you start the Chantix. If symptoms do not improve or worsen, please contact office for sooner follow up or seek emergency care.

## 2024-02-09 NOTE — Assessment & Plan Note (Addendum)
 Smoking/Tobacco Cessation Counseling Martha Wilson is a current user of tobacco or nicotine products. She is ready to quit at this time. Counseling provided today addressed the risks of continued use and the benefits of cessation. Discussed tobacco/nicotine use history, readiness to quit, and evidence-based treatment options including behavioral strategies, support resources, and pharmacologic therapies. Provided encouragement and educational materials on steps and resources to quit smoking. Patient questions were addressed, and follow-up recommended for continued support. Total time spent on counseling: 5 minutes.    Plan is to start her on Chantix. Side effect/safety profile reviewed. No significant mental health hx. Mild CAD on prior cardiac CT otherwise no cardiac hx that would contraindicate use. She was debating between Chantix and NRT OTC. She will notify if she starts the Chantix.

## 2024-02-09 NOTE — Assessment & Plan Note (Addendum)
 Stable RLL ground glass nodule since 2023, likely benign. New 7 mm RUL ground glass nodule without solid component. Plan to repeat CT chest in 6 months for monitoring. Smoking cessation strongly advised. Aware of risks of malignancy with smoking.   Patient Instructions  Repeat CT chest in 6 months for monitoring of the new lung nodule  Work on quitting smoking   We will prescribe you Chantix to help with quitting smoking On days 1-3, take 0.5 mg tablet once daily then on days 4-7 take 0.5 mg tablet Twice daily. After this, you will increase to the maintenance dose which is 1 mg Twice daily and will continue this for a minimum of 12 weeks after you quit smoking Go to the emergency department with new onset chest pain Stop and notify of any mood changes  Follow up in 6 months after the CT chest scan with Dr. Tamea. Follow up with Katie Chloe Bluett,NP in 6 weeks, if you start the Chantix. If symptoms do not improve or worsen, please contact office for sooner follow up or seek emergency care.

## 2024-02-09 NOTE — Assessment & Plan Note (Signed)
 Asymptomatic. No need for PFT at this time. If she develops any SOB, will obtain. Encouraged to remain active. Smoking cessation.

## 2024-02-09 NOTE — Progress Notes (Signed)
 @Patient  ID: Martha Wilson, female    DOB: 1951/03/17, 73 y.o.   MRN: [id number]  Chief Complaint  Patient presents with   Medical Management of Chronic Issues    Referring provider: [name] Verneita CROME, MD  HPI: 73 year old female, active smoker followed for lung nodule. She is a patient of Dr. Tamea and last seen in office 05/10/2021. Past medical history significant for CAD, cardiomyopathy,   TEST/EVENTS:  01/28/2024 CT chest: centrilobular and peribronchial ground glass nodules within the lung apices are again identified, smoking related bronchiolitis. Bronchial wall thickening, again noted. New 7 mm ground glass nodule RUL. Stable 10 mm ground glass nodule RLL.   05/10/2021: OV with Dr. Tamea. Incidental lung nodule on cardiac CT with groundglass opacity on RLL, 10-11 mm. Indeterminate. Dedicated CT chest ordered. Smoking cessation advised. No associated symptoms.   02/09/2024: Today - follow up Discussed the use of AI scribe software for clinical note transcription with the patient, who gave verbal consent to proceed.  History of Present Illness Martha Wilson is a 73 year old female who presents for follow-up of CT chest scan.   She has been undergoing regular CT scans to monitor lung nodules. A previously identified lung nodule has remained unchanged. However, a new 7 mm nodule has been detected in the right lung. No hemoptysis, anorexia, weight loss, night sweats.   She is an active smoker. She had successfully quit smoking on June 22nd but resumed after her mother's funeral due to stress. She currently smokes three cigarettes a day.   She has not reported significant breathing problems recently. After quitting smoking previously, she experienced more energy and better exercise tolerance. Rare cough. No issues with congestion or wheezing.     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   Adhesive [Tape] Rash    Band-aids    Immunization History  Administered Date(s) Administered   Fluad Quad(high Dose 65+) 02/15/2019, 12/21/2019, 12/25/2020, 03/18/2022   Moderna Sars-Covid-2 Vaccination 04/22/2019, 05/13/2019, 02/01/2020, 08/24/2020   PNEUMOCOCCAL CONJUGATE-20 12/25/2020    Past Medical History:  Diagnosis Date   Allergies    Arthritis    right knee   Headache    History of methicillin resistant staphylococcus aureus (MRSA)    Hyperthyroidism    MVP (mitral valve prolapse)    Normal pressure hydrocephalus (HCC)    Osteopenia    Palpitations    stress related   Skin cancer    BASAL CELL   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   by [facility] for NPH   Tricuspid valve prolapse    Wears dentures    partial upper and lower    Tobacco History: Social History   Tobacco Use  Smoking Status Every Day   Current packs/day: 0.25   Average packs/day: 0.5 packs/day for 35.2 years (17.5 ttl pk-yrs)   Types: Cigarettes   Start date: 05/23/1987   Last attempt to quit: 05/22/2022  Smokeless Tobacco Never   Ready to quit: Not Answered Counseling given: Not Answered   Outpatient Medications Prior to Visit  Medication Sig Dispense Refill   acetaminophen  (TYLENOL ) 500 MG tablet Take 1,000 mg by mouth every 6 (six) hours as needed for moderate pain or headache.     aspirin  EC 81 MG tablet Take 1 tablet (81 mg total) by mouth daily. Swallow whole. 30 tablet 5  atorvastatin  (LIPITOR) 20 MG tablet Take 1 tablet (20 mg total) by mouth daily. 90 tablet 1   cholecalciferol (VITAMIN D3) 25 MCG (1000 UNIT) tablet Take 2,000 Units by mouth daily.     Cyanocobalamin  (B-12) 2500 MCG TABS Take 2,500 mcg by mouth daily.     cycloSPORINE (RESTASIS) 0.05 % ophthalmic emulsion 1 drop 2 (two) times daily.     fluticasone (FLONASE) 50 MCG/ACT nasal spray Place 2 sprays into both nostrils daily.      hydrocortisone 2.5 % cream Apply 1 Application topically as needed.     methimazole  (TAPAZOLE ) 5 MG tablet Take 0.5 tablets (2.5 mg total) by mouth at bedtime. 90 tablet 1   Multiple Vitamins-Minerals (PRESERVISION AREDS 2) CAPS Take 1 capsule by mouth 2 (two) times daily.     NON FORMULARY TIMELINE MITOPURE     valACYclovir  (VALTREX ) 500 MG tablet TAKE 1 TABLET (500 MG TOTAL) BY MOUTH DAILY. 90 tablet 1   Facility-Administered Medications Prior to Visit  Medication Dose Route Frequency Provider Last Rate Last Admin   denosumab  (PROLIA ) injection 60 mg  60 mg Subcutaneous Q6 months Tullo, Teresa L, MD   60 mg at 06/02/23 0950   denosumab  (PROLIA ) injection 60 mg  60 mg Subcutaneous Q6 months Tullo, Teresa L, MD   60 mg at 12/01/23 0953   [START ON 06/02/2024] denosumab  (PROLIA ) injection 60 mg  60 mg Subcutaneous Q6 months [name] Verneita CROME, MD         Review of Systems: as above    Physical Exam:  BP 122/88   Pulse 69   Temp 97.7 F (36.5 C) (Temporal)   Ht 5' 5 (1.651 m)   Wt 119 lb (54 kg)   SpO2 99%   BMI 19.80 kg/m   GEN: Pleasant, interactive, well-appearing; in no acute distress HEENT:  Normocephalic and atraumatic. PERRLA. Sclera white. Nasal turbinates pink, moist and patent bilaterally. No rhinorrhea present. Oropharynx pink and moist, without exudate or edema. No lesions, ulcerations, or postnasal drip.  NECK:  Supple w/ fair ROM.  CV: RRR, no m/r/g PULMONARY:  Unlabored, regular breathing. Clear bilaterally A&P w/o wheezes/rales/rhonchi. No accessory muscle use.  GI: BS present and normoactive. Soft, non-tender to palpation MSK: No erythema, warmth or tenderness.  Neuro: A/Ox3. No focal deficits noted.   Skin: Warm, no lesions or rashe Psych: Normal affect and behavior. Judgement and thought content appropriate.     Lab Results:  CBC    Component Value Date/Time   WBC 6.1 01/22/2024 0812   RBC 4.21 01/22/2024 0812   HGB 15.4 (H) 01/22/2024 0812   HCT 46.2  (H) 01/22/2024 0812   PLT 268.0 01/22/2024 0812   MCV 109.9 (H) 01/22/2024 0812   MCHC 33.3 01/22/2024 0812   RDW 13.9 01/22/2024 0812   LYMPHSABS 2.0 01/22/2024 0812   MONOABS 0.6 01/22/2024 0812   EOSABS 0.1 01/22/2024 0812   BASOSABS 0.1 01/22/2024 0812    BMET    Component Value Date/Time   NA 140 01/22/2024 0812   NA 140 04/16/2021 1250   K 4.8 01/22/2024 0812   CL 104 01/22/2024 0812   CO2 26 01/22/2024 0812   GLUCOSE 81 01/22/2024 0812   BUN 18 01/22/2024 0812   BUN 18 04/16/2021 1250   CREATININE 1.03 01/22/2024 0812   CALCIUM  9.1 01/22/2024 0812    BNP No results found for: BNP   Imaging:  CT CHEST WO CONTRAST Result Date: 01/31/2024 EXAM: CT CHEST WITHOUT CONTRAST 01/28/2024  02:22:40 PM TECHNIQUE: CT of the chest was performed without the administration of intravenous contrast. Multiplanar reformatted images are provided for review. Automated exposure control, iterative reconstruction, and/or weight based adjustment of the mA/kV was utilized to reduce the radiation dose to as low as reasonably achievable. COMPARISON: Comparison made to January 17, 2023. CLINICAL HISTORY: Lung nodule, 6-42mm. Follow up lung nodule seen on prior imaging; patient had COVID 11/2022. History (Hx) of Asthma. No known immune (NKI) history of Skin Cancer (CA). History of benign breast lump resected on right; current smoker, only 3 cigarettes a day x 50 years. FINDINGS: MEDIASTINUM: Global cardiac size within normal limits. No pericardial effusion. All coronary artery calcifications are noted. Mild atherosclerotic calcification within the thoracic aorta. No aortic aneurysm. Thyroid  goiter is again noted, unchanged. LYMPH NODES: No pathologic thoracic adenopathy. LUNGS AND PLEURA: Centrilobular and peribronchial ground-glass nodules within the lung apices are again identified, in keeping with changes of respiratory bronchiolitis. Bronchial wall thickening is noted, in keeping with airway  inflammation. A new 7 mm ground glass pulmonary nodule within the right upper lobe is identified. A mean 10 mm ground glass pulmonary nodule within the right lower lobe is stable. No pneumothorax or pleural effusion. SOFT TISSUES/BONES: Ventriculoperitoneal shunt catheter treatment is again noted within the anterior chest wall. Osseous structures are age appropriate. No acute bone abnormality. No lytic or blastic bone lesion. Mild pectus deformity is noted. UPPER ABDOMEN: Limited images of the upper abdomen demonstrate no acute abnormality. IMPRESSION: 1. New 7 mm ground-glass pulmonary nodule in the right upper lobe; as per Fleischner Society Guidelines, recommend CT at 612 months to confirm persistence, then CT every 2 years until 5 years; if growth or a solid component develops, consider resection. 2. Stable 10 mm ground-glass pulmonary nodule in the right lower lobe since the remote prior examination of April 23, 2021. ; as per Fleischner Society Guidelines, recommend continued surveillance with CT every 2 years until a total of 5 years of stability; if growth or a solid component develops, consider resection. 3. Centrilobular and peribronchial ground-glass nodules within the lung apices, in keeping with respiratory bronchiolitis. 4. Bronchial wall thickening, in keeping with airway inflammation. Electronically signed by: Dorethia Molt MD 01/31/2024 02:02 AM EDT RP Workstation: [id number]   DG Skull 1-3 Views Result Date: 01/29/2024 CLINICAL DATA:  Check shunt integrity EXAM: SKULL - 1-3 VIEW COMPARISON:  05/31/2020 FINDINGS: Right-sided shunt catheter is again identified entering just to the right of the midline. The overall appearance is similar to that seen on the prior exam. No acute abnormality noted. IMPRESSION: Stable appearing shunt catheter. Electronically Signed   By: Oneil Devonshire M.D.   On: 01/29/2024 02:20   DG Cervical Spine 1 View Result Date: 01/29/2024 CLINICAL DATA:  Check shunt  integrity EXAM: DG CERVICAL SPINE 1V COMPARISON:  05/31/2020 FINDINGS: Right-sided shunt catheter is identified coursing through the neck towards the anterior chest wall. The catheter appears intact. No acute abnormality is noted. Degenerative changes of thoracic spine are seen. IMPRESSION: Intact right-sided shunt catheter Electronically Signed   By: Oneil Devonshire M.D.   On: 01/29/2024 02:19   DG Abd 1 View Result Date: 01/28/2024 EXAM: 1 VIEW XRAY OF THE ABDOMEN 01/27/2024 11:22:35 AM COMPARISON: 05/31/2020 CLINICAL HISTORY: shunt survey/series. Shunt survey, follow up ventriculoperitoneal shunt FINDINGS: BOWEL: Nonobstructive bowel gas pattern. SOFT TISSUES: Partially visualized ventriculoperitoneal shunt catheter projecting over the left abdomen, tip left lower quadrant without discontinuity. No opaque urinary calculi. BONES: Levocurvature of the lumbar spine.  No acute osseous abnormality. IMPRESSION: 1. Partially visualized ventriculoperitoneal shunt catheter in the left abdomen with tip in the left lower quadrant, without discontinuity. Electronically signed by: Dayne Hassell MD 01/28/2024 04:37 PM EDT RP Workstation: [id number]   DG Chest 1 View Result Date: 01/28/2024 EXAM: 1 VIEW(S) XRAY OF THE CHEST 01/27/2024 11:22:35 AM COMPARISON: 05/31/2020 CLINICAL HISTORY: shunt survey/series. Shunt survey, follow up ventriculoperitoneal shunt FINDINGS: LINES, TUBES AND DEVICES: Right ventriculoperitoneal shunt catheter. Catheter courses along right neck, right hemithorax, and right hemiabdomen, proximal and distal aspects not included. LUNGS AND PLEURA: No focal pulmonary opacity. No pulmonary edema. No pleural effusion. No pneumothorax. HEART AND MEDIASTINUM: Aortic atherosclerosis (ICD10-I70.0). BONES AND SOFT TISSUES: No acute osseous abnormality. IMPRESSION: 1. Right ventriculoperitoneal shunt catheter visualized intact along the right hemithorax Electronically signed by: Katheleen Faes MD 01/28/2024 04:35  PM EDT RP Workstation: [id number]    Administration History     None           No data to display          No results found for: NITRICOXIDE      Assessment & Plan:   Pulmonary nodule, right Stable RLL ground glass nodule since 2023, likely benign. New 7 mm RUL ground glass nodule without solid component. Plan to repeat CT chest in 6 months for monitoring. Smoking cessation strongly advised. Aware of risks of malignancy with smoking.   Patient Instructions  Repeat CT chest in 6 months for monitoring of the new lung nodule  Work on quitting smoking   We will prescribe you Chantix to help with quitting smoking On days 1-3, take 0.5 mg tablet once daily then on days 4-7 take 0.5 mg tablet Twice daily. After this, you will increase to the maintenance dose which is 1 mg Twice daily and will continue this for a minimum of 12 weeks after you quit smoking Go to the emergency department with new onset chest pain Stop and notify of any mood changes  Follow up in 6 months after the CT chest scan with Dr. Tamea. Follow up with Katie Hartleigh Edmonston,NP in 6 weeks, if you start the Chantix. If symptoms do not improve or worsen, please contact office for sooner follow up or seek emergency care.    Emphysema of lung (HCC) Asymptomatic. No need for PFT at this time. If she develops any SOB, will obtain. Encouraged to remain active. Smoking cessation.   Cigarette smoker Smoking/Tobacco Cessation Counseling Lucita Montoya is a current user of tobacco or nicotine products. She is ready to quit at this time. Counseling provided today addressed the risks of continued use and the benefits of cessation. Discussed tobacco/nicotine use history, readiness to quit, and evidence-based treatment options including behavioral strategies, support resources, and pharmacologic therapies. Provided encouragement and educational materials on steps and resources to quit smoking. Patient questions were  addressed, and follow-up recommended for continued support. Total time spent on counseling: 5 minutes.    Plan is to start her on Chantix. Side effect/safety profile reviewed. No significant mental health hx. Mild CAD on prior cardiac CT otherwise no cardiac hx that would contraindicate use. She was debating between Chantix and NRT OTC. She will notify if she starts the Chantix.    Advised if symptoms do not improve or worsen, to please contact office for sooner follow up or seek emergency care.   I spent 35 minutes of dedicated to the care of this patient on the date of this encounter to include pre-visit review of records, face-to-face  time with the patient discussing conditions above, post visit ordering of testing, clinical documentation with the electronic health record, making appropriate referrals as documented, and communicating necessary findings to members of the patients care team.  Comer LULLA Rouleau, NP 02/09/2024  Pt aware and understands NP's role.

## 2024-02-10 ENCOUNTER — Ambulatory Visit: Admitting: Physical Therapy

## 2024-02-10 ENCOUNTER — Ambulatory Visit: Attending: Internal Medicine | Admitting: Physical Therapy

## 2024-02-10 DIAGNOSIS — R2689 Other abnormalities of gait and mobility: Secondary | ICD-10-CM | POA: Diagnosis present

## 2024-02-10 DIAGNOSIS — M5459 Other low back pain: Secondary | ICD-10-CM | POA: Diagnosis present

## 2024-02-10 DIAGNOSIS — M533 Sacrococcygeal disorders, not elsewhere classified: Secondary | ICD-10-CM | POA: Insufficient documentation

## 2024-02-10 NOTE — Patient Instructions (Signed)
 Feet care :  Self -feet massage   Handshake : fingers between toes, moving ballmounds/toes back and forth several times while other hand anchors at arch. Do the same at the hind/mid foot.  Heel to toes upward to a letter Big Letter T strokes to spread ballmounds and toes, several times, pinch between webs of toes  Run finger tips along top of foot between long bones comb between the bones    Wiggle toes and spread them out when relaxing    ___________  Feet slides :   Points of contact at sitting bones  Four points of contact of foot,  Side knee back while keeping knee out along 2-3rd toe line   Heel up, ankle not twist out Lower heel while keeping knee out along 2-3rd toe line Four points of contact of foot, Slide foot back while keeping knee out along 2-3rd toe line   Repeated with other foot   3 min

## 2024-02-10 NOTE — Therapy (Addendum)
 OUTPATIENT PHYSICAL THERAPY TREATMENT  / Recert    Patient Name: Martha Wilson MRN: [id number] DOB:[birth year], 73 y.o., female Today's Date: 02/10/2024   PT End of Session - 02/10/24 1343     Visit Number 7    Number of Visits 17   Date for Recertification  04/21/2023    PT Start Time 1115    PT Stop Time 1200    PT Time Calculation (min) 45 min    Activity Tolerance Patient tolerated treatment well;No increased pain    Behavior During Therapy WFL for tasks assessed/performed          Past Medical History:  Diagnosis Date   Allergies    Arthritis    right knee   Headache    History of methicillin resistant staphylococcus aureus (MRSA)    Hyperthyroidism    MVP (mitral valve prolapse)    Normal pressure hydrocephalus (HCC)    Osteopenia    Palpitations    stress related   Skin cancer    BASAL CELL   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   by [facility] for NPH   Tricuspid valve prolapse    Wears dentures    partial upper and lower   Past Surgical History:  Procedure Laterality Date   APPLICATION OF CRANIAL NAVIGATION N/A 05/31/2020   Procedure: APPLICATION OF CRANIAL NAVIGATION;  Surgeon: Clois Reeves, MD;  Location: [facility];  Service: Neurosurgery;  Laterality: N/A;   BREAST BIOPSY  1980's   pt states she had a bx in the 80's, not sure of side, benign   breat biopsy     CATARACT EXTRACTION W/PHACO Right 09/22/2018   Procedure: CATARACT EXTRACTION PHACO AND INTRAOCULAR LENS PLACEMENT (IOC)  RIGHT panooptix;  Surgeon: Jaye Fallow, MD;  Location: [facility];  Service: Ophthalmology;  Laterality: Right;   CATARACT EXTRACTION W/PHACO Left 10/06/2018   Procedure: CATARACT EXTRACTION PHACO AND INTRAOCULAR LENS PLACEMENT (IOC) LEFT PANOPTIX LENS;  Surgeon: Jaye Fallow, MD;  Location: [facility];  Service: Ophthalmology;  Laterality: Left;   COLONOSCOPY     COLONOSCOPY WITH PROPOFOL  N/A 11/19/2018    Procedure: COLONOSCOPY WITH PROPOFOL ;  Surgeon: Therisa Bi, MD;  Location: [facility];  Service: Gastroenterology;  Laterality: N/A;   COLONOSCOPY WITH PROPOFOL  N/A 09/21/2021   Procedure: COLONOSCOPY WITH PROPOFOL ;  Surgeon: Therisa Bi, MD;  Location: [facility];  Service: Gastroenterology;  Laterality: N/A;   ORIF FOOT FRACTURE     VENTRICULOPERITONEAL SHUNT Right 05/31/2020   Procedure: SHUNT INSERTION VENTRICULAR-PERITONEAL;  Surgeon: Clois Reeves, MD;  Location: [facility];  Service: Neurosurgery;  Laterality: Right;  Dr Tye to assist   Patient Active Problem List   Diagnosis Date Noted   Urinary and fecal incontinence 10/21/2023   Diarrhea 07/05/2023   Macrocytosis 04/22/2023   Post-menopause atrophic vaginitis 04/22/2023   Aortic atherosclerosis (HCC) 04/22/2023   Dysuria 03/24/2023   Positive self-administered antigen test for COVID-19 11/11/2022   Neck pain without injury 10/05/2022   Skin cancer of arm 10/05/2022   Urinary frequency 10/05/2022   CKD stage 3a, GFR 45-59 ml/min (HCC) 10/04/2022   Nausea 06/26/2022   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 03/19/2022   Osteoporosis 02/19/2022   Cardiomyopathy (HCC) 06/25/2021   Pulmonary nodule, right 06/25/2021   Tinnitus aurium, bilateral 12/26/2020   Status post ventriculoperitoneal shunt 06/21/2020   NPH (normal pressure hydrocephalus) (HCC) 05/31/2020   Orthostasis 11/30/2019   Abnormal EKG 11/30/2019   Prediabetes 11/30/2019   Dizziness 11/08/2019  Balance disorder 10/03/2019   Sciatica neuralgia 10/01/2019   Tubular adenoma of colon 11/21/2018   Multiple thyroid  nodules 10/30/2018   Hyperthyroidism 10/30/2018   Tricuspid valve insufficiency, non-rheumatic 10/30/2018   Coronary artery disease due to lipid rich plaque 10/30/2018   Tobacco abuse 10/30/2018   Urge and stress incontinence 05/04/2014    PCP: [name]   REFERRING PROVIDER: [name]   REFERRING DIAG: Weakness of Anal  Sphincter   Rationale for Evaluation and Treatment Rehabilitation  THERAPY DIAG:  Sacrococcygeal disorders, not elsewhere classified  Other abnormalities of gait and mobility  Other low back pain  ONSET DATE:   SUBJECTIVE:         SUBJECTIVE STATEMENT TODAY:   Pt reported she has bought new tennis shoes ( hoka) and got rid of older shoes that do not have wider toe box Pt has noticed she grips her toes when waking         LBP no longer radiates to thigh. Pain decreased from 8/10 to 5/10.                                                                                SUBJECTIVE STATEMENT ON EVAL  12/01/23  1) urge incontinence : Pt wears a Depends when traveling. Pt has to drink 64 fl oz water a day and aim for 80 fl oz due to NPH.   Pt seeks public restrooms once an hour.   When not traveling pt does not wear a pantyliner.  Pt arrives home in her garage and the moment she opens the door., she has leakage before making it to the bathroom. This occurs 60% of the time   2)  fecal leakage: pt is not aware when it occurs . Pt has stool type 6-7 / bowel movements occurs daily. Straining across 10% of the time .   3) LBP radiating to posterior L midthigh:  Pt has to put ice on it to go to sleep once a month, 3 nights.. Sometimes it is 8/10 with traveling and carrying bag over her shoulder, lifting groceries. suitcase, objects from floor, lifting mattress to make her bed.   Pain eases with massage and stretches.   Pt missed a month of walking due to her back . Pt typically walks 1.2 miles.  Pt is not stretching after walking   Enjoys Chair yoga , walking 1.2 miles,   Pt used to do tai chi but she has concerns about falling due to NPH .  Pt has not been back since her ventriculoperitoneal shunt placement.    PERTINENT HISTORY:  Ventriculoperitoneal shunt (Right) due to  normal pressure hydrocephalus   PAIN:  Are you having pain? Yes: see above   PRECAUTIONS:  Normal pressure  hydrocephalus:   Pt cannot go near magnets/ MRI  . Pt gets dizziness with leaning forward / gardening. Pt avoids gardening   WEIGHT BEARING RESTRICTIONS:   No   FALLS:  Has patient fallen in last 6 months? No   LIVING ENVIRONMENT: Lives with: alone  Lives in: [address] community  Stairs: in [place], one story, in [place], two stories    OCCUPATION: retired runner, broadcasting/film/video   PLOF: IND  PATIENT GOALS:   Not having to go into the bathroom when she walks into the house. Be able to walk 30-60 min , be IND   OBJECTIVE:    [facility] PT Assessment - 02/10/24 1344       AROM   Overall AROM Comments B 90 deg DF OKC, ( post Tx on R LE: 70, L 80 deg)   Hallux valgus R > L      Palpation   Palpation comment hypomobile midfoot joints R, tightness along plantar fascia, flexed distal/ proximal metatarsal R digit I-V > L,  tightness and tenderness at R tib-fib , increased swelliing at posterior lateral R ankle > L      Ambulation/Gait   Gait Comments heel striking, minimal hip/ knee flexion            [facility] Adult PT Treatment/Exercise - 02/10/24 1346       Ambulation/Gait   Gait Comments provided tactile cues for gait training to promote more anterior COM, hip/ knee flexion, stronger push off with toe extension/ abduction      Self-Care   Other Self-Care Comments  showed anatomy of feet anatomy, explained use of transverse arch , minimizing toe flexion , optimizing mobility of feet for balance      Neuro Re-ed    Neuro Re-ed Details  provided propioceptive cues in seated position, to promote feet mobility, toe abduction, less flexed toes, lpliability of plantar fascia B , alignment of LKC , cued for self massage of feet ot promote mobility ,      Manual Therapy   Manual therapy comments PA/AP mob grade II-III, STM/MWM at midfoot joints, plantar fascia to promote DF AROM and toe abduction R  ( plan to address L foot  next session)(             HOME EXERCISE PROGRAM: See pt  instruction section    ASSESSMENT:  CLINICAL IMPRESSION:   Pt met 3/8 goals and progressing well towards remaining goals related to the following Sx:   1) urge incontinence 2)  fecal leakage: 3) LBP radiating to posterior L midthigh  Improvements include  Pt showed levelled pelvis and shoulders and more stacked posture which helps with IAP function for leakage and balance  LBP no longer radiates to thigh. Pain decreased from 8/10 to 5/10.   Pt is returning to walking without LBP issues and without a cane    Pt no longer gets up in the middle of the night pee as often, decreased from 3 x a night to 1x night.  Pt stool consistency improved to Type 4 daily instead  of Type 6-7.     Remaining issues to work on with skilled P T include fecal leakage, urge incontinence and balance.    Focused today on LKC deficits ( toe flexion, hallus valgus, tight plantar fascia/ hypomobile midfoot).  Provided manual Tx to promote DF AROM, toe abduction and midfoot jint mobility on RLE and plan to address LLE next session. Pt showed increased DF AROM post Tx. Provided propioceptive cues in seated position, to promote feet mobility, toe abduction, less flexed toes, lpliability of plantar fascia B , alignment of LKC , cued for self massage of feet ot promote mobility .  Anticipate regional interdependent approach will helpwill help promote optimize IAP system for improved pelvic floor function, trunk stability, gait, balance, stabilization with mobility tasks.  Provided tactile cues for gait training to promote more anterior COM, hip/ knee flexion, stronger push off  with toe extension/ abduction  Plan to continue with manual Tx on LLE   and progress to deep core training at upcoming sessions  Regional interdependent approaches will yield greater benefits in pt's POC.  Pt w/ ventriculoperitoneal shunt (Right) due to  normal pressure hydrocephalus and plan to use semi reclined positions in lieu of supine if  needed.  Provided education to avoid any exercise/ yoga poses where hips are higher than heart which can cause increased intracranial pressure to the gravity.    Plan to address pelvic floor issues once pelvis and spine are realigned to yield better outcomes.                                                       Pt benefits from skilled PT to address remaining goals.    OBJECTIVE IMPAIRMENTS decreased activity tolerance, decreased coordination, decreased endurance, decreased mobility, difficulty walking, decreased ROM, decreased strength, decreased safety awareness, hypomobility, increased muscle spasms, impaired flexibility, improper body mechanics, postural dysfunction, and pain.   ACTIVITY LIMITATIONS  self-care,   home chores, work tasks    PARTICIPATION LIMITATIONS:  community, walking activities    PERSONAL FACTORS   affecting patient's functional outcome:    REHAB POTENTIAL: Good   CLINICAL DECISION MAKING: Evolving/moderate complexity   EVALUATION COMPLEXITY: Moderate    PATIENT EDUCATION:    Education details: Showed pt anatomy images. Explained muscles attachments/ connection, physiology of deep core system/ spinal- thoracic-pelvis-lower kinetic chain as they relate to pt's presentation, Sx, and past Hx. Explained what and how these areas of deficits need to be restored to balance and function    See Therapeutic activity / neuromuscular re-education section  Answered pt's questions.   Person educated: Patient Education method: Explanation, Demonstration, Tactile cues, Verbal cues, and Handouts Education comprehension: verbalized understanding, returned demonstration, verbal cues required, tactile cues required, and needs further education     PLAN: PT FREQUENCY: 1x/week   PT DURATION: 10 weeks   PLANNED INTERVENTIONS:   Gait training;Stair training;Functional mobility training;DME Instruction;Therapeutic activities;Therapeutic exercise;Balance  training;Neuromuscular re-education;Patient/family education;Vestibular;Visual/perceptual remediation/compensation;Passive range of motion;Moist Heat;Cryotherapy;Traction;Canalith Repostioning;Joint Manipulations;Manual lymph drainage;Manual techniques;Scar mobilization;Energy conservation;Dry needling;ADLs/Self Care Home Management;Biofeedback;Electrical Stimulation;Taping    PLAN FOR NEXT SESSION: See clinical impression for plan     GOALS: Goals reviewed with patient? Yes  SHORT TERM GOALS: Target date: 12/29/2023      Pt will demo IND with HEP                    Baseline: Not IND            Goal status: INITIAL   LONG TERM GOALS: Target date: 04/21/2023     1.Pt will demo proper deep core coordination without chest breathing and optimal excursion of diaphragm/pelvic floor in order to promote spinal stability and pelvic floor function  Baseline: dyscoordination Goal status: ongoing   2.  Pt will demo proper body mechanics in against gravity tasks and ADLs  work tasks, fitness  to minimize straining pelvic floor / back    Baseline: not IND, improper form that places strain on pelvic floor  Goal status: ongoing   3. Pt will demo reciprocal gait pattern, longer stride length  in order to ambulate safely in community and return to fitness routine , possibly return to tai chi Baseline:  armswings  present, heel striking, minimal knee /hip flexion  Goal status: MET    4. Pt will demo levelled pelvic girdle and shoulder height in order to progress to deep core strengthening HEP and restore mobility at spine, pelvis, gait, posture minimize falls, and improve balance  Baseline: R shoulder/ R pelvis lowered  Goal status:   MET   5. Pt will improve PFDI-7 questionnaire to  pts  score change  to demo improved QOL  Baseline:    ( greater pts indicate greater negative impact on QOL)  57   pts  ( total)    --> 48   27   pts  ( UIQ-7 )   --->   29  27   pts  ( CRAIQ-7 )   --> 19  0    pts  ( POPIQ-7 )   Goal status: ongoing  6. Pt will report no longer seeking bathroom in public restrooms every hour and instead, able to go once every 2 hours in order to shop and travel  Baseline:Pt wears a Depends when traveling. Pt has to drink 64 fl oz water a day and aim for 80 fl oz due to NPH.   Pt seeks public restrooms once an hour.   When not traveling pt does not wear a pantyliner.  Pt arrives home in her garage and the moment she opens the door., she has leakage before making it to the bathroom. This occurs 60% of the time  Goal status ; ongoing   7. Pt will report increased Type 4 stool consistency to >25% of the time and no more straining . Pt will report less fecal leakage by 50% of the time  Baseline: . Pt has stool type 6-7 / bowel movements occurs daily. Straining across 10% of the time .  Goal status:  MET  no straining with BM and Type 4 across 100 % of the time    8. Pt will report no leakage upon arriving in garage and able to to make it to the toilet before leakage across  > 50%  of the time  Baseline:Pt arrives home in her garage and the moment she opens the door., she has leakage before making it to the bathroom. This leakage  occurs 60% of the time  Goal status: Ongoing   Pia Lupe Plump, PT 02/10/2024, 1:51 PM

## 2024-02-18 ENCOUNTER — Ambulatory Visit: Admitting: Physical Therapy

## 2024-02-19 ENCOUNTER — Encounter: Admitting: Physical Therapy

## 2024-02-24 ENCOUNTER — Ambulatory Visit: Admitting: Physical Therapy

## 2024-02-24 DIAGNOSIS — M5459 Other low back pain: Secondary | ICD-10-CM

## 2024-02-24 DIAGNOSIS — R2689 Other abnormalities of gait and mobility: Secondary | ICD-10-CM

## 2024-02-24 DIAGNOSIS — M533 Sacrococcygeal disorders, not elsewhere classified: Secondary | ICD-10-CM | POA: Diagnosis not present

## 2024-02-24 NOTE — Addendum Note (Signed)
 Addended by: PIA FISCAL on: 02/24/2024 10:05 AM   Modules accepted: Orders

## 2024-02-24 NOTE — Therapy (Signed)
 OUTPATIENT PHYSICAL THERAPY TREATMENT     Patient Name: Martha Wilson MRN: [id number] DOB:09-Dec-1950, 73 y.o., female Today's Date: 02/24/2024   PT End of Session - 02/24/24 1005     Visit Number 8    Number of Visits 17    Date for Recertification  04/21/23    PT Start Time 0940    PT Stop Time 1020    PT Time Calculation (min) 40 min    Activity Tolerance Patient tolerated treatment well;No increased pain    Behavior During Therapy WFL for tasks assessed/performed           Past Medical History:  Diagnosis Date   Allergies    Arthritis    right knee   Headache    History of methicillin resistant staphylococcus aureus (MRSA)    Hyperthyroidism    MVP (mitral valve prolapse)    Normal pressure hydrocephalus (HCC)    Osteopenia    Palpitations    stress related   Skin cancer    BASAL CELL   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   by [facility] for NPH   Tricuspid valve prolapse    Wears dentures    partial upper and lower   Past Surgical History:  Procedure Laterality Date   APPLICATION OF CRANIAL NAVIGATION N/A 05/31/2020   Procedure: APPLICATION OF CRANIAL NAVIGATION;  Surgeon: Clois Reeves, MD;  Location: [facility];  Service: Neurosurgery;  Laterality: N/A;   BREAST BIOPSY  1980's   pt states she had a bx in the 80's, not sure of side, benign   breat biopsy     CATARACT EXTRACTION W/PHACO Right 09/22/2018   Procedure: CATARACT EXTRACTION PHACO AND INTRAOCULAR LENS PLACEMENT (IOC)  RIGHT panooptix;  Surgeon: Jaye Fallow, MD;  Location: [facility];  Service: Ophthalmology;  Laterality: Right;   CATARACT EXTRACTION W/PHACO Left 10/06/2018   Procedure: CATARACT EXTRACTION PHACO AND INTRAOCULAR LENS PLACEMENT (IOC) LEFT PANOPTIX LENS;  Surgeon: Jaye Fallow, MD;  Location: [facility];  Service: Ophthalmology;  Laterality: Left;   COLONOSCOPY     COLONOSCOPY WITH PROPOFOL  N/A 11/19/2018   Procedure:  COLONOSCOPY WITH PROPOFOL ;  Surgeon: Therisa Bi, MD;  Location: [facility];  Service: Gastroenterology;  Laterality: N/A;   COLONOSCOPY WITH PROPOFOL  N/A 09/21/2021   Procedure: COLONOSCOPY WITH PROPOFOL ;  Surgeon: Therisa Bi, MD;  Location: [facility];  Service: Gastroenterology;  Laterality: N/A;   ORIF FOOT FRACTURE     VENTRICULOPERITONEAL SHUNT Right 05/31/2020   Procedure: SHUNT INSERTION VENTRICULAR-PERITONEAL;  Surgeon: Clois Reeves, MD;  Location: [facility];  Service: Neurosurgery;  Laterality: Right;  Dr Tye to assist   Patient Active Problem List   Diagnosis Date Noted   Urinary and fecal incontinence 10/21/2023   Diarrhea 07/05/2023   Macrocytosis 04/22/2023   Post-menopause atrophic vaginitis 04/22/2023   Aortic atherosclerosis (HCC) 04/22/2023   Dysuria 03/24/2023   Positive self-administered antigen test for COVID-19 11/11/2022   Neck pain without injury 10/05/2022   Skin cancer of arm 10/05/2022   Urinary frequency 10/05/2022   CKD stage 3a, GFR 45-59 ml/min (HCC) 10/04/2022   Nausea 06/26/2022   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 03/19/2022   Osteoporosis 02/19/2022   Cardiomyopathy (HCC) 06/25/2021   Pulmonary nodule, right 06/25/2021   Tinnitus aurium, bilateral 12/26/2020   Status post ventriculoperitoneal shunt 06/21/2020   NPH (normal pressure hydrocephalus) (HCC) 05/31/2020   Orthostasis 11/30/2019   Abnormal EKG 11/30/2019   Prediabetes 11/30/2019   Dizziness 11/08/2019  Balance disorder 10/03/2019   Sciatica neuralgia 10/01/2019   Tubular adenoma of colon 11/21/2018   Multiple thyroid  nodules 10/30/2018   Hyperthyroidism 10/30/2018   Tricuspid valve insufficiency, non-rheumatic 10/30/2018   Coronary artery disease due to lipid rich plaque 10/30/2018   Tobacco abuse 10/30/2018   Urge and stress incontinence 05/04/2014    PCP: [name]   REFERRING PROVIDER: [name]   REFERRING DIAG: Weakness of Anal Sphincter    Rationale for Evaluation and Treatment Rehabilitation  THERAPY DIAG:  Sacrococcygeal disorders, not elsewhere classified  Other abnormalities of gait and mobility  Other low back pain  ONSET DATE:   SUBJECTIVE:         SUBJECTIVE STATEMENT TODAY:  Pt reported she  had a fall when opening her fridge this past week. Pt fell onto L hip but did ot hit her head, No injuries .  Buttock soreness.  Pt is noticing toe gripping and catching her herself to to spread toes.                                                                                      SUBJECTIVE STATEMENT ON EVAL  12/01/23  1) urge incontinence : Pt wears a Depends when traveling. Pt has to drink 64 fl oz water a day and aim for 80 fl oz due to NPH.   Pt seeks public restrooms once an hour.   When not traveling pt does not wear a pantyliner.  Pt arrives home in her garage and the moment she opens the door., she has leakage before making it to the bathroom. This occurs 60% of the time   2)  fecal leakage: pt is not aware when it occurs . Pt has stool type 6-7 / bowel movements occurs daily. Straining across 10% of the time .   3) LBP radiating to posterior L midthigh:  Pt has to put ice on it to go to sleep once a month, 3 nights.. Sometimes it is 8/10 with traveling and carrying bag over her shoulder, lifting groceries. suitcase, objects from floor, lifting mattress to make her bed.   Pain eases with massage and stretches.   Pt missed a month of walking due to her back . Pt typically walks 1.2 miles.  Pt is not stretching after walking   Enjoys Chair yoga , walking 1.2 miles,   Pt used to do tai chi but she has concerns about falling due to NPH .  Pt has not been back since her ventriculoperitoneal shunt placement.    PERTINENT HISTORY:  Ventriculoperitoneal shunt (Right) due to  normal pressure hydrocephalus   PAIN:  Are you having pain? Yes: see above   PRECAUTIONS:  Normal pressure hydrocephalus:   Pt cannot go  near magnets/ MRI  . Pt gets dizziness with leaning forward / gardening. Pt avoids gardening   WEIGHT BEARING RESTRICTIONS:   No   FALLS:  Has patient fallen in last 6 months? No   LIVING ENVIRONMENT: Lives with: alone  Lives in: [address] community  Stairs: in [place], one story, in [place], two stories    OCCUPATION: retired runner, broadcasting/film/video   PLOF: IND   PATIENT  GOALS:   Not having to go into the bathroom when she walks into the house. Be able to walk 30-60 min , be IND   OBJECTIVE:    [facility] PT Assessment - 02/24/24 1448       Palpation   Palpation comment hypomobile midfoot joints L, tightness along plantar fascia, ightness and tenderness at L tib-fib , medial ankle    Gait:  more balanced with gait, more WBing equal, less supination, longer stance phase B       [facility] Adult PT Treatment/Exercise - 02/24/24 1022       Therapeutic Activites    Therapeutic Activities Other Therapeutic Activities    Other Therapeutic Activities practiced wide BOS, ski track stance for opening heavy doors, explained wide BOS for balance      Neuro Re-ed    Neuro Re-ed Details  provided tactile and propioception cues for  stbility of wider BOs with ski track stance for pushing and pulling      Manual Therapy   Manual therapy comments PA/AP mob grade II-III, STM/MWM at midfoot joints, plantar fascia to promote DF AROM and toe abduction L               HOME EXERCISE PROGRAM: See pt instruction section    ASSESSMENT:  CLINICAL IMPRESSION:   Pt met 3/8 goals and progressing well towards remaining goals related to the following Sx:   1) urge incontinence 2)  fecal leakage: 3) LBP radiating to posterior L midthigh  Improvements include  Pt showed levelled pelvis and shoulders and more stacked posture which helps with IAP function for leakage and balance  LBP no longer radiates to thigh. Pain decreased from 8/10 to 5/10.   Pt is returning to walking without LBP issues and without  a cane    Pt no longer gets up in the middle of the night pee as often, decreased from 3 x a night to 1x night.  Pt stool consistency improved to Type 4 daily instead  of Type 6-7.     Remaining issues to work on with skilled PT include fecal leakage, urge incontinence and balance.    Gait and balance continue to improve.  Focused today on LKC deficits  on L ( toe flexion, hallus valgus, tight plantar fascia/ hypomobile midfoot).  Provided manual Tx to promote DF AROM, toe abduction and midfoot joint mobility on LLE.     Plan to continue with manual Tx on LLE   and progress to deep core training at upcoming sessions  Regional interdependent approaches will yield greater benefits in pt's POC.  Pt w/ ventriculoperitoneal shunt (Right) due to  normal pressure hydrocephalus and plan to use semi reclined positions in lieu of supine if needed.  Provided education to avoid any exercise/ yoga poses where hips are higher than heart which can cause increased intracranial pressure to the gravity.    Plan to address pelvic floor issues once pelvis and spine are realigned to yield better outcomes.                                                       Pt benefits from skilled PT to address remaining goals.    OBJECTIVE IMPAIRMENTS decreased activity tolerance, decreased coordination, decreased endurance, decreased mobility, difficulty walking, decreased ROM, decreased strength, decreased safety awareness, hypomobility,  increased muscle spasms, impaired flexibility, improper body mechanics, postural dysfunction, and pain.   ACTIVITY LIMITATIONS  self-care,   home chores, work tasks    PARTICIPATION LIMITATIONS:  community, walking activities    PERSONAL FACTORS   affecting patient's functional outcome:    REHAB POTENTIAL: Good   CLINICAL DECISION MAKING: Evolving/moderate complexity   EVALUATION COMPLEXITY: Moderate    PATIENT EDUCATION:    Education details: Showed pt anatomy images.  Explained muscles attachments/ connection, physiology of deep core system/ spinal- thoracic-pelvis-lower kinetic chain as they relate to pt's presentation, Sx, and past Hx. Explained what and how these areas of deficits need to be restored to balance and function    See Therapeutic activity / neuromuscular re-education section  Answered pt's questions.   Person educated: Patient Education method: Explanation, Demonstration, Tactile cues, Verbal cues, and Handouts Education comprehension: verbalized understanding, returned demonstration, verbal cues required, tactile cues required, and needs further education     PLAN: PT FREQUENCY: 1x/week   PT DURATION: 10 weeks   PLANNED INTERVENTIONS:   Gait training;Stair training;Functional mobility training;DME Instruction;Therapeutic activities;Therapeutic exercise;Balance training;Neuromuscular re-education;Patient/family education;Vestibular;Visual/perceptual remediation/compensation;Passive range of motion;Moist Heat;Cryotherapy;Traction;Canalith Repostioning;Joint Manipulations;Manual lymph drainage;Manual techniques;Scar mobilization;Energy conservation;Dry needling;ADLs/Self Care Home Management;Biofeedback;Electrical Stimulation;Taping    PLAN FOR NEXT SESSION: See clinical impression for plan     GOALS: Goals reviewed with patient? Yes  SHORT TERM GOALS: Target date: 12/29/2023      Pt will demo IND with HEP                    Baseline: Not IND            Goal status: INITIAL   LONG TERM GOALS: Target date: 04/21/2023     1.Pt will demo proper deep core coordination without chest breathing and optimal excursion of diaphragm/pelvic floor in order to promote spinal stability and pelvic floor function  Baseline: dyscoordination Goal status: ongoing   2.  Pt will demo proper body mechanics in against gravity tasks and ADLs  work tasks, fitness  to minimize straining pelvic floor / back    Baseline: not IND, improper form that  places strain on pelvic floor  Goal status: ongoing   3. Pt will demo reciprocal gait pattern, longer stride length  in order to ambulate safely in community and return to fitness routine , possibly return to tai chi Baseline:  armswings present, heel striking, minimal knee /hip flexion  Goal status: MET    4. Pt will demo levelled pelvic girdle and shoulder height in order to progress to deep core strengthening HEP and restore mobility at spine, pelvis, gait, posture minimize falls, and improve balance  Baseline: R shoulder/ R pelvis lowered  Goal status:   MET   5. Pt will improve PFDI-7 questionnaire to  pts  score change  to demo improved QOL  Baseline:    ( greater pts indicate greater negative impact on QOL)  57   pts  ( total)    --> 48   27   pts  ( UIQ-7 )   --->   29  27   pts  ( CRAIQ-7 )   --> 19  0   pts  ( POPIQ-7 )   Goal status: ongoing  6. Pt will report no longer seeking bathroom in public restrooms every hour and instead, able to go once every 2 hours in order to shop and travel  Baseline:Pt wears a Depends when traveling. Pt has to drink 64 fl  oz water a day and aim for 80 fl oz due to NPH.   Pt seeks public restrooms once an hour.   When not traveling pt does not wear a pantyliner.  Pt arrives home in her garage and the moment she opens the door., she has leakage before making it to the bathroom. This occurs 60% of the time  Goal status ; ongoing   7. Pt will report increased Type 4 stool consistency to >25% of the time and no more straining . Pt will report less fecal leakage by 50% of the time  Baseline: . Pt has stool type 6-7 / bowel movements occurs daily. Straining across 10% of the time .  Goal status:  MET  no straining with BM and Type 4 across 100 % of the time    8. Pt will report no leakage upon arriving in garage and able to to make it to the toilet before leakage across  > 50%  of the time  Baseline:Pt arrives home in her garage and the moment she  opens the door., she has leakage before making it to the bathroom. This leakage  occurs 60% of the time  Goal status: Ongoing   Pia Lupe Plump, PT 02/24/2024, 10:06 AM

## 2024-02-25 ENCOUNTER — Encounter: Admitting: Physical Therapy

## 2024-02-25 NOTE — Telephone Encounter (Signed)
 open in error

## 2024-02-26 ENCOUNTER — Ambulatory Visit
Admission: RE | Admit: 2024-02-26 | Discharge: 2024-02-26 | Disposition: A | Discharge status: Home / Self Care | Source: Ambulatory Visit | Attending: Internal Medicine | Admitting: Internal Medicine

## 2024-02-26 DIAGNOSIS — Z1231 Encounter for screening mammogram for malignant neoplasm of breast: Secondary | ICD-10-CM | POA: Insufficient documentation

## 2024-03-02 ENCOUNTER — Ambulatory Visit: Admitting: Physical Therapy

## 2024-03-02 ENCOUNTER — Other Ambulatory Visit: Payer: Self-pay | Admitting: Internal Medicine

## 2024-03-02 DIAGNOSIS — R928 Other abnormal and inconclusive findings on diagnostic imaging of breast: Secondary | ICD-10-CM

## 2024-03-19 ENCOUNTER — Ambulatory Visit
Admission: RE | Admit: 2024-03-19 | Discharge: 2024-03-19 | Disposition: A | Source: Ambulatory Visit | Attending: Internal Medicine | Admitting: Internal Medicine

## 2024-03-19 DIAGNOSIS — R928 Other abnormal and inconclusive findings on diagnostic imaging of breast: Secondary | ICD-10-CM

## 2024-03-22 ENCOUNTER — Ambulatory Visit: Payer: Self-pay | Admitting: Internal Medicine

## 2024-03-23 ENCOUNTER — Encounter: Admitting: Physical Therapy

## 2024-03-29 ENCOUNTER — Ambulatory Visit: Attending: Internal Medicine | Admitting: Physical Therapy

## 2024-03-29 DIAGNOSIS — M533 Sacrococcygeal disorders, not elsewhere classified: Secondary | ICD-10-CM | POA: Diagnosis present

## 2024-03-29 DIAGNOSIS — R2689 Other abnormalities of gait and mobility: Secondary | ICD-10-CM | POA: Insufficient documentation

## 2024-03-29 DIAGNOSIS — M5459 Other low back pain: Secondary | ICD-10-CM | POA: Diagnosis present

## 2024-03-29 NOTE — Patient Instructions (Addendum)
 Lengthen Back rib by L  shoulder ( winging)  Lie on R  side , pillow between knees and under head  Pull  L arm overhead over mattress, grab the edge of mattress,pull it upward, drawing elbow away from ears  Breathing 10 reps  Brushing arm with 3/4 turn onto pillow behind back  Lying on R  side ,Pillow/ Block between knees  dragging top forearm across ribs below breast rotating 3/4 turn,  rotating  _L_ only this week ,  relax onto the pillow behind the back  and then back to other palm , maintain top palm on body whole top and not lift shoulder Do this side this week       Wait do both sides until we have levelled out your spine and shoulders _  Discontinue leg lift exercise you learned from previous PT Which will help with your neck pain and from worsening fecal leakage  __  Stretches : (Cuing provided for proper alignment)  Instructions start with Strap on R   ___    Avoid straining pelvic floor, abdominal muscles , spine  Use log rolling technique instead of getting out of bed with your neck or the sit-up     Log rolling into and out of bed   Log rolling into and out of bed If getting out of bed on R side, Bent knees, scoot hips/ shoulder to L  Raise R arm completely overhead, rolling onto armpit  Then lower bent knees to bed to get into complete side lying position  Then drop legs off bed, and push up onto R elbow/forearm, and use L hand to push onto the bed    Dig elbows and feet to lift hte buttocks and scoot without lifting head   __  Proper body mechanics with getting out of a chair to decrease strain  on back &pelvic floor   Avoid holding your breath when Getting out of the chair:  Scoot to front part of chair chair Heels behind knees, feet are hip width apart, nose over toes  Inhale like you are smelling roses Exhale to stand     Stretches for your legs: LAYING on Back Suction cups down on back of neck, upper arms and elbows  for stability  before   pulling strap Opposite knee bent and foot firm in align with hip   Strap on [place]: toe spread no clawing   Hamstring _knee bends  10 reps  With knee pointing straight ( slightly to outside to minimize snapping sensation)      10 reps with knee pointing out towards armpit ( notice the stretch in the medial hamstring muscle)    On your back again: Sciatica stretch    Hip abductors ( figure 4)   Strap under R thigh, inhale quiet, feel ribs expand, then exhale then pull thigh in      ,L ankle over R

## 2024-03-29 NOTE — Therapy (Signed)
 " OUTPATIENT PHYSICAL THERAPY TREATMENT     Patient Name: Martha Wilson MRN: [id number] DOB:Nov 09, 1950, 73 y.o., female Today's Date: 03/29/2024   PT End of Session - 03/29/24 1156     Visit Number 9    Number of Visits 17    Date for Recertification  04/21/23    PT Start Time 1150    PT Stop Time 1235    PT Time Calculation (min) 45 min    Activity Tolerance Patient tolerated treatment well;No increased pain    Behavior During Therapy WFL for tasks assessed/performed           Past Medical History:  Diagnosis Date   Allergies    Arthritis    right knee   Headache    History of methicillin resistant staphylococcus aureus (MRSA)    Hyperthyroidism    MVP (mitral valve prolapse)    Normal pressure hydrocephalus (HCC)    Osteopenia    Palpitations    stress related   Skin cancer    BASAL CELL   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   by [facility] for NPH   Tricuspid valve prolapse    Wears dentures    partial upper and lower   Past Surgical History:  Procedure Laterality Date   APPLICATION OF CRANIAL NAVIGATION N/A 05/31/2020   Procedure: APPLICATION OF CRANIAL NAVIGATION;  Surgeon: Clois Reeves, MD;  Location: [facility];  Service: Neurosurgery;  Laterality: N/A;   BREAST BIOPSY  1980's   pt states she had a bx in the 80's, not sure of side, benign   breat biopsy     CATARACT EXTRACTION W/PHACO Right 09/22/2018   Procedure: CATARACT EXTRACTION PHACO AND INTRAOCULAR LENS PLACEMENT (IOC)  RIGHT panooptix;  Surgeon: Jaye Fallow, MD;  Location: [facility];  Service: Ophthalmology;  Laterality: Right;   CATARACT EXTRACTION W/PHACO Left 10/06/2018   Procedure: CATARACT EXTRACTION PHACO AND INTRAOCULAR LENS PLACEMENT (IOC) LEFT PANOPTIX LENS;  Surgeon: Jaye Fallow, MD;  Location: [facility];  Service: Ophthalmology;  Laterality: Left;   COLONOSCOPY     COLONOSCOPY WITH PROPOFOL  N/A 11/19/2018   Procedure:  COLONOSCOPY WITH PROPOFOL ;  Surgeon: Therisa Bi, MD;  Location[facility];  Service: Gastroenterology;  Laterality: N/A;   COLONOSCOPY WITH PROPOFOL  N/A 09/21/2021   Procedure: COLONOSCOPY WITH PROPOFOL ;  Surgeon: Therisa Bi, MD;  Location: [facility] END[facility];  Service: Gastroenterology;  Laterality: N/A;   ORIF FOOT FRACTURE     VENTRICULOPERITONEAL SHUNT Right 05/31/2020   Procedure: SHUNT INSERTION VENTRICULAR-PERITONEAL;  Surgeon: Clois Reeves, MD;  Location: [facility];  Service: Neurosurgery;  Laterality: Right;  Dr Tye to assist   Patient Active Problem List   Diagnosis Date Noted   Urinary and fecal incontinence 10/21/2023   Diarrhea 07/05/2023   Macrocytosis 04/22/2023   Post-menopause atrophic vaginitis 04/22/2023   Aortic atherosclerosis (HCC) 04/22/2023   Dysuria 03/24/2023   Positive self-administered antigen test for COVID-19 11/11/2022   Neck pain without injury 10/05/2022   Skin cancer of arm 10/05/2022   Urinary frequency 10/05/2022   CKD stage 3a, GFR 45-59 ml/min (HCC) 10/04/2022   Nausea 06/26/2022   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 03/19/2022   Osteoporosis 02/19/2022   Cardiomyopathy (HCC) 06/25/2021   Pulmonary nodule, right 06/25/2021   Tinnitus aurium, bilateral 12/26/2020   Status post ventriculoperitoneal shunt 06/21/2020   NPH (normal pressure hydrocephalus) (HCC) 05/31/2020   Orthostasis 11/30/2019   Abnormal EKG 11/30/2019   Prediabetes 11/30/2019   Dizziness 11/08/2019  Balance disorder 10/03/2019   Sciatica neuralgia 10/01/2019   Tubular adenoma of colon 11/21/2018   Multiple thyroid  nodules 10/30/2018   Hyperthyroidism 10/30/2018   Tricuspid valve insufficiency, non-rheumatic 10/30/2018   Coronary artery disease due to lipid rich plaque 10/30/2018   Tobacco abuse 10/30/2018   Urge and stress incontinence 05/04/2014    PCP: [name]   REFERRING PROVIDER: [name]   REFERRING DIAG: Weakness of Anal Sphincter    Rationale for Evaluation and Treatment Rehabilitation  THERAPY DIAG:    ONSET DATE:   SUBJECTIVE:         SUBJECTIVE STATEMENT TODAY:  Pt reported she is not keeping up with her exercises as much as she should be. Pt has  gone back doing previous exercises for her back , including leg lifts. Pt has had radiating LBP but not as intense from 8/10 to 5/10.    Pt noticed a fecal leakage was worst last week which is also the same time pt returned to doing old exercises that were not given by current PT    Pt has only kept up with one exercise from current PT.  When pt was coming to PT consistently, pt noticed LBP was occurring has frequently.                                                                     SUBJECTIVE STATEMENT ON EVAL  12/01/23  1) urge incontinence : Pt wears a Depends when traveling. Pt has to drink 64 fl oz water a day and aim for 80 fl oz due to NPH.   Pt seeks public restrooms once an hour.   When not traveling pt does not wear a pantyliner.  Pt arrives home in her garage and the moment she opens the door., she has leakage before making it to the bathroom. This occurs 60% of the time   2)  fecal leakage: pt is not aware when it occurs . Pt has stool type 6-7 / bowel movements occurs daily. Straining across 10% of the time .   3) LBP radiating to posterior L midthigh:  Pt has to put ice on it to go to sleep once a month, 3 nights.. Sometimes it is 8/10 with traveling and carrying bag over her shoulder, lifting groceries. suitcase, objects from floor, lifting mattress to make her bed.   Pain eases with massage and stretches.   Pt missed a month of walking due to her back . Pt typically walks 1.2 miles.  Pt is not stretching after walking   Enjoys Chair yoga , walking 1.2 miles,   Pt used to do tai chi but she has concerns about falling due to NPH .  Pt has not been back since her ventriculoperitoneal shunt placement.    PERTINENT HISTORY:  Ventriculoperitoneal  shunt (Right) due to  normal pressure hydrocephalus   PAIN:  Are you having pain? Yes: see above   PRECAUTIONS:  Normal pressure hydrocephalus:   Pt cannot go near magnets/ MRI  . Pt gets dizziness with leaning forward / gardening. Pt avoids gardening   WEIGHT BEARING RESTRICTIONS:   No   FALLS:  Has patient fallen in last 6 months? No   LIVING ENVIRONMENT: Lives with: alone  Lives in: [place]  [facility]  Stairs: in [place], one story, in [place], two stories    OCCUPATION: retired runner, broadcasting/film/video   PLOF: IND   PATIENT GOALS:   Not having to go into the bathroom when she walks into the house. Be able to walk 30-60 min , be IND   OBJECTIVE:   [facility] PT Assessment - 03/29/24 1251       Observation/Other Assessments   Observations more upright posture      Sit to Stand   Comments improved DF AROM but poor technique for anterior tilt of pelvis      Other:   Other/ Comments neck  and back strain with her past PT leg lift exericse , strap stretches      Other:   Other/Comments poor carry over with past thoracic mobility stretches , poor carry over with sit to stand and logrolling technique to minimize LBP and straining pelvic floor      Ambulation/Gait   Gait Comments 1.24 m/s reciproc al gait          [facility] Adult PT Treatment/Exercise - 03/29/24 1251       Therapeutic Activites    Therapeutic Activities Other Therapeutic Activities    Other Therapeutic Activities problem solved relapse of LBP from doing old PT  exercises which strain back and neck,  explained the importance of compmliance to body mechanics to sit to stand, logrolling, treturning to thoracic mobility stretches, consolidated HEP for compmlaince, discussed more appts for continued improvements      Neuro Re-ed    Neuro Re-ed Details  provided tactile and propiocept cues for logrolling, sit sto stand, stabilization points with strap stretches to optimize toe abduction, less neck strain              HOME EXERCISE PROGRAM: See pt instruction section    ASSESSMENT:  CLINICAL IMPRESSION:   Pt met 3/8 goals and progressing well towards remaining goals related to the following Sx:   1) urge incontinence 2)  fecal leakage: 3) LBP radiating to posterior L midthigh  Improvements include  Pt showed levelled pelvis and shoulders and more stacked posture which helps with IAP function for leakage and balance  LBP no longer radiates to thigh. Pain decreased from 8/10 to 5/10.   Pt is returning to walking without LBP issues and without a cane    Pt no longer gets up in the middle of the night pee as often, decreased from 3 x a night to 1x night.  Pt stool consistency improved to Type 4 daily instead  of Type 6-7.     Remaining issues to work on with skilled PT include fecal leakage, urge incontinence and balance.    Gait and balance continue to improve.     Focused today to review old PT exericses pt had learned from another PT and discussed discontinuing leg lift to minimzi relapse of LBP and fecal leakage.  Problem solved relapse of LBP from doing old PT  exercises which strain back and neck,  explained the importance of compmliance to body mechanics to sit to stand, logrolling, treturning to thoracic mobility stretches, consolidated HEP for compmlaince, discussed more appts for continued improvements   Provided tactile and propiocept cues for logrolling, sit sto stand, stabilization points with strap stretches to optimize toe abduction, less neck strain   Plan to continue  progress to deep core training at upcoming sessions  Regional interdependent approaches will yield greater benefits in pt's POC.  Pt w/ ventriculoperitoneal shunt (  Right) due to  normal pressure hydrocephalus and plan to use semi reclined positions in lieu of supine if needed.  Provided education to avoid any exercise/ yoga poses where hips are higher than heart which can cause increased intracranial  pressure to the gravity.    Plan to address pelvic floor issues once pelvis and spine are realigned to yield better outcomes.                                                       Pt benefits from skilled PT to address remaining goals.    OBJECTIVE IMPAIRMENTS decreased activity tolerance, decreased coordination, decreased endurance, decreased mobility, difficulty walking, decreased ROM, decreased strength, decreased safety awareness, hypomobility, increased muscle spasms, impaired flexibility, improper body mechanics, postural dysfunction, and pain.   ACTIVITY LIMITATIONS  self-care,   home chores, work tasks    PARTICIPATION LIMITATIONS:  community, walking activities    PERSONAL FACTORS   affecting patient's functional outcome:    REHAB POTENTIAL: Good   CLINICAL DECISION MAKING: Evolving/moderate complexity   EVALUATION COMPLEXITY: Moderate    PATIENT EDUCATION:    Education details: Showed pt anatomy images. Explained muscles attachments/ connection, physiology of deep core system/ spinal- thoracic-pelvis-lower kinetic chain as they relate to pt's presentation, Sx, and past Hx. Explained what and how these areas of deficits need to be restored to balance and function    See Therapeutic activity / neuromuscular re-education section  Answered pt's questions.   Person educated: Patient Education method: Explanation, Demonstration, Tactile cues, Verbal cues, and Handouts Education comprehension: verbalized understanding, returned demonstration, verbal cues required, tactile cues required, and needs further education     PLAN: PT FREQUENCY: 1x/week   PT DURATION: 10 weeks   PLANNED INTERVENTIONS:   Gait training;Stair training;Functional mobility training;DME Instruction;Therapeutic activities;Therapeutic exercise;Balance training;Neuromuscular re-education;Patient/family education;Vestibular;Visual/perceptual remediation/compensation;Passive range of motion;Moist  Heat;Cryotherapy;Traction;Canalith Repostioning;Joint Manipulations;Manual lymph drainage;Manual techniques;Scar mobilization;Energy conservation;Dry needling;ADLs/Self Care Home Management;Biofeedback;Electrical Stimulation;Taping    PLAN FOR NEXT SESSION: See clinical impression for plan     GOALS: Goals reviewed with patient? Yes  SHORT TERM GOALS: Target date: 12/29/2023      Pt will demo IND with HEP                    Baseline: Not IND            Goal status: INITIAL   LONG TERM GOALS: Target date: 04/21/2023     1.Pt will demo proper deep core coordination without chest breathing and optimal excursion of diaphragm/pelvic floor in order to promote spinal stability and pelvic floor function  Baseline: dyscoordination Goal status: ongoing   2.  Pt will demo proper body mechanics in against gravity tasks and ADLs  work tasks, fitness  to minimize straining pelvic floor / back    Baseline: not IND, improper form that places strain on pelvic floor  Goal status: ongoing   3. Pt will demo reciprocal gait pattern, longer stride length  in order to ambulate safely in community and return to fitness routine , possibly return to tai chi Baseline:  armswings present, heel striking, minimal knee /hip flexion  Goal status: MET    4. Pt will demo levelled pelvic girdle and shoulder height in order to progress to deep core strengthening HEP and restore mobility at spine, pelvis, gait,  posture minimize falls, and improve balance  Baseline: R shoulder/ R pelvis lowered  Goal status:   MET   5. Pt will improve PFDI-7 questionnaire to  pts  score change  to demo improved QOL  Baseline:    ( greater pts indicate greater negative impact on QOL)  57   pts  ( total)    --> 48   27   pts  ( UIQ-7 )   --->   29  27   pts  ( CRAIQ-7 )   --> 19  0   pts  ( POPIQ-7 )   Goal status: ongoing  6. Pt will report no longer seeking bathroom in public restrooms every hour and instead, able to go  once every 2 hours in order to shop and travel  Baseline:Pt wears a Depends when traveling. Pt has to drink 64 fl oz water a day and aim for 80 fl oz due to NPH.   Pt seeks public restrooms once an hour.   When not traveling pt does not wear a pantyliner.  Pt arrives home in her garage and the moment she opens the door., she has leakage before making it to the bathroom. This occurs 60% of the time  Goal status ; ongoing   7. Pt will report increased Type 4 stool consistency to >25% of the time and no more straining . Pt will report less fecal leakage by 50% of the time  Baseline: . Pt has stool type 6-7 / bowel movements occurs daily. Straining across 10% of the time .  Goal status:  MET  no straining with BM and Type 4 across 100 % of the time    8. Pt will report no leakage upon arriving in garage and able to to make it to the toilet before leakage across  > 50%  of the time  Baseline:Pt arrives home in her garage and the moment she opens the door., she has leakage before making it to the bathroom. This leakage  occurs 60% of the time  Goal status: Ongoing   Pia Lupe Plump, PT 03/29/2024, 11:56 AM  "

## 2024-04-05 ENCOUNTER — Ambulatory Visit: Admitting: Physical Therapy

## 2024-04-12 ENCOUNTER — Ambulatory Visit: Attending: Internal Medicine | Admitting: Physical Therapy

## 2024-04-12 DIAGNOSIS — M533 Sacrococcygeal disorders, not elsewhere classified: Secondary | ICD-10-CM | POA: Insufficient documentation

## 2024-04-12 DIAGNOSIS — M5459 Other low back pain: Secondary | ICD-10-CM | POA: Insufficient documentation

## 2024-04-12 DIAGNOSIS — R2689 Other abnormalities of gait and mobility: Secondary | ICD-10-CM | POA: Insufficient documentation

## 2024-04-20 ENCOUNTER — Ambulatory Visit: Admitting: Physical Therapy

## 2024-04-20 DIAGNOSIS — M533 Sacrococcygeal disorders, not elsewhere classified: Secondary | ICD-10-CM

## 2024-04-20 DIAGNOSIS — R2689 Other abnormalities of gait and mobility: Secondary | ICD-10-CM

## 2024-04-20 DIAGNOSIS — M5459 Other low back pain: Secondary | ICD-10-CM | POA: Diagnosis present

## 2024-04-20 NOTE — Patient Instructions (Signed)
" °  Proper body mechanics with getting out of a chair to decrease strain  on back &pelvic floor   Avoid holding your breath when Getting out of the chair:  Scoot to front part of chair chair Heels behind knees, feet are hip width apart, nose over toes  Inhale like you are smelling roses Exhale to stand    __  Minisquat: Scoot buttocks back slight, hinge like you are looking at your reflection on a pond  Knees behind toes,  Inhale to smell flowers  Exhale on the rise like rocket  Do not lock knees, have more weight across ballmounds of feet, toes relaxed and spread them, not grip them   10 reps x 3 x day   __   deep core level 1 ( 2 x a day)  "

## 2024-04-20 NOTE — Therapy (Signed)
 " OUTPATIENT PHYSICAL THERAPY TREATMENT  / Recert/  Progress Note across 10 visits 12/01/23 to  04/20/24    Patient Name: Martha Wilson MRN: [id number] DOB:September 18, 1950, 74 y.o., female Today's Date: 04/20/2024   PT End of Session - 04/20/24 1014     Visit Number 10    Number of Visits 20    Date for Recertification  06/29/24    PT Start Time 0930    PT Stop Time 1015    PT Time Calculation (min) 45 min    Activity Tolerance Patient tolerated treatment well;No increased pain    Behavior During Therapy WFL for tasks assessed/performed           Past Medical History:  Diagnosis Date   Allergies    Arthritis    right knee   Headache    History of methicillin resistant staphylococcus aureus (MRSA)    Hyperthyroidism    MVP (mitral valve prolapse)    Normal pressure hydrocephalus (HCC)    Osteopenia    Palpitations    stress related   Skin cancer    BASAL CELL   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   by [facility] for NPH   Tricuspid valve prolapse    Wears dentures    partial upper and lower   Past Surgical History:  Procedure Laterality Date   APPLICATION OF CRANIAL NAVIGATION N/A 05/31/2020   Procedure: APPLICATION OF CRANIAL NAVIGATION;  Surgeon: Clois Reeves, MD;  Location: [facility];  Service: Neurosurgery;  Laterality: N/A;   BREAST BIOPSY  1980's   pt states she had a bx in the 80's, not sure of side, benign   breat biopsy     CATARACT EXTRACTION W/PHACO Right 09/22/2018   Procedure: CATARACT EXTRACTION PHACO AND INTRAOCULAR LENS PLACEMENT (IOC)  RIGHT panooptix;  Surgeon: Jaye Fallow, MD;  Location: [facility];  Service: Ophthalmology;  Laterality: Right;   CATARACT EXTRACTION W/PHACO Left 10/06/2018   Procedure: CATARACT EXTRACTION PHACO AND INTRAOCULAR LENS PLACEMENT (IOC) LEFT PANOPTIX LENS;  Surgeon: Jaye Fallow, MD;  Location: [facility];  Service: Ophthalmology;  Laterality: Left;   COLONOSCOPY      COLONOSCOPY WITH PROPOFOL  N/A 11/19/2018   Procedure: COLONOSCOPY WITH PROPOFOL ;  Surgeon: Therisa Bi, MD;  Location: [facility];  Service: Gastroenterology;  Laterality: N/A;   COLONOSCOPY WITH PROPOFOL  N/A 09/21/2021   Procedure: COLONOSCOPY WITH PROPOFOL ;  Surgeon: Therisa Bi, MD;  Location: [facility];  Service: Gastroenterology;  Laterality: N/A;   ORIF FOOT FRACTURE     VENTRICULOPERITONEAL SHUNT Right 05/31/2020   Procedure: SHUNT INSERTION VENTRICULAR-PERITONEAL;  Surgeon: Clois Reeves, MD;  Location: [facility];  Service: Neurosurgery;  Laterality: Right;  Dr Tye to assist   Patient Active Problem List   Diagnosis Date Noted   Urinary and fecal incontinence 10/21/2023   Diarrhea 07/05/2023   Macrocytosis 04/22/2023   Post-menopause atrophic vaginitis 04/22/2023   Aortic atherosclerosis (HCC) 04/22/2023   Dysuria 03/24/2023   Positive self-administered antigen test for COVID-19 11/11/2022   Neck pain without injury 10/05/2022   Skin cancer of arm 10/05/2022   Urinary frequency 10/05/2022   CKD stage 3a, GFR 45-59 ml/min (HCC) 10/04/2022   Nausea 06/26/2022   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 03/19/2022   Osteoporosis 02/19/2022   Cardiomyopathy (HCC) 06/25/2021   Pulmonary nodule, right 06/25/2021   Tinnitus aurium, bilateral 12/26/2020   Status post ventriculoperitoneal shunt 06/21/2020   NPH (normal pressure hydrocephalus) (HCC) 05/31/2020   Orthostasis 11/30/2019  Abnormal EKG 11/30/2019   Prediabetes 11/30/2019   Dizziness 11/08/2019   Balance disorder 10/03/2019   Sciatica neuralgia 10/01/2019   Tubular adenoma of colon 11/21/2018   Multiple thyroid  nodules 10/30/2018   Hyperthyroidism 10/30/2018   Tricuspid valve insufficiency, non-rheumatic 10/30/2018   Coronary artery disease due to lipid rich plaque 10/30/2018   Tobacco abuse 10/30/2018   Urge and stress incontinence 05/04/2014    PCP: [name]   REFERRING  PROVIDER: [name]   REFERRING DIAG: Weakness of Anal Sphincter   Rationale for Evaluation and Treatment Rehabilitation  THERAPY DIAG:  Sacrococcygeal disorders, not elsewhere classified   Other abnormalities of gait and mobility   Other low back pain  ONSET DATE:   SUBJECTIVE:         SUBJECTIVE STATEMENT TODAY:  Pt reported leakage issues was not as b ad during her trip to [facility] but upon returning home, she had more urge and fecal incontinence episodes for the past 4 days   SUBJECTIVE STATEMENT ON EVAL  12/01/23  1) urge incontinence : Pt wears a Depends when traveling. Pt has to drink 64 fl oz water a day and aim for 80 fl oz due to NPH.   Pt seeks public restrooms once an hour.   When not traveling pt does not wear a pantyliner.  Pt arrives home in her garage and the moment she opens the door., she has leakage before making it to the bathroom. This occurs 60% of the time   2)  fecal leakage: pt is not aware when it occurs . Pt has stool type 6-7 / bowel movements occurs daily. Straining across 10% of the time .   3) LBP radiating to posterior L midthigh:  Pt has to put ice on it to go to sleep once a month, 3 nights.. Sometimes it is 8/10 with traveling and carrying bag over her shoulder, lifting groceries. suitcase, objects from floor, lifting mattress to make her bed.   Pain eases with massage and stretches.   Pt missed a month of walking due to her back . Pt typically walks 1.2 miles.  Pt is not stretching after walking   Enjoys Chair yoga , walking 1.2 miles,   Pt used to do tai chi but she has concerns about falling due to NPH .  Pt has not been back since her ventriculoperitoneal shunt placement.    PERTINENT HISTORY:  Ventriculoperitoneal shunt (Right) due to  normal pressure hydrocephalus   PAIN:  Are you having pain? Yes: see above   PRECAUTIONS:  Normal pressure hydrocephalus:   Pt cannot go near magnets/ MRI  . Pt gets dizziness with leaning forward / gardening. Pt  avoids gardening   WEIGHT BEARING RESTRICTIONS:   No   FALLS:  Has patient fallen in last 6 months? No   LIVING ENVIRONMENT: Lives with: alone  Lives in: [address] community  Stairs: in [place], one story, in [place], two stories    OCCUPATION: retired runner, broadcasting/film/video   PLOF: IND   PATIENT GOALS:   Not having to go into the bathroom when she walks into the house. Be able to walk 30-60 min , be IND   OBJECTIVE:   [facility] PT Assessment - 04/20/24 1017       Coordination   Coordination and Movement Description overuse of ab with deep core training      Squat   Comments knees anterior to toes      Sit to Stand   Comments sitting down  with genu valgus      Palpation   SI assessment  levelled pelvis and shoulder, no more forward head posture          [facility] Adult PT Treatment/Exercise - 04/20/24 1018       Therapeutic Activites    Therapeutic Activities Other Therapeutic Activities    Other Therapeutic Activities reviewed logrolling/ sit to stand, minisquat,, reviewed past HEP to build compliance for HEP,      Neuro Re-ed    Neuro Re-ed Details  provided  cues for motor planning and propioception for anterior tilt of pelvis and LKC coactivation with deep core training, logrolling, sit to stand, squats             HOME EXERCISE PROGRAM: See pt instruction section    ASSESSMENT:  CLINICAL IMPRESSION:   Pt met 3/8 goals and progressing well towards remaining goals related to the following Sx:   1) urge incontinence 2)  fecal leakage: 3) LBP radiating to posterior L midthigh  Improvements include  Pt showed levelled pelvis and shoulders and more stacked posture which helps with IAP function for leakage and balance  LBP no longer radiates to thigh. Pain decreased from 8/10 to 5/10.   Pt is returning to walking without LBP issues and without a cane    Pt no longer gets up in the middle of the night pee as often, decreased from 3 x a night to 1x night.  Pt stool  consistency improved to Type 4 daily instead  of Type 6-7.     Remaining issues to work on with skilled PT include fecal leakage, urge incontinence and balance.    Gait and balance continue to improve.   Focused today to review a previous stretch for thoracic spine to help pt get correct technique. Progressed to deep core level 1 with  provided cues for motor planning and propioception for anterior tilt of pelvis and LKC coactivation  Also provided cues for motor planning and propioception logrolling, sit to stand, squats . Pt demo'd correct technique post Tx  Plan to continue  progress to deep core training level 2  at upcoming session.   Regional interdependent approaches will yield greater benefits in pt's POC.  Pt w/ ventriculoperitoneal shunt (Right) due to  normal pressure hydrocephalus and plan to use semi reclined positions in lieu of supine if needed.  Provided education to avoid any exercise/ yoga poses where hips are higher than heart which can cause increased intracranial pressure to the gravity.    Plan to address pelvic floor issues once pelvis and spine are realigned to yield better outcomes.                                                       Pt benefits from skilled PT to address remaining goals.    OBJECTIVE IMPAIRMENTS decreased activity tolerance, decreased coordination, decreased endurance, decreased mobility, difficulty walking, decreased ROM, decreased strength, decreased safety awareness, hypomobility, increased muscle spasms, impaired flexibility, improper body mechanics, postural dysfunction, and pain.   ACTIVITY LIMITATIONS  self-care,   home chores, work tasks    PARTICIPATION LIMITATIONS:  community, walking activities    PERSONAL FACTORS   affecting patient's functional outcome:    REHAB POTENTIAL: Good   CLINICAL DECISION MAKING: Evolving/moderate complexity   EVALUATION COMPLEXITY: Moderate  PATIENT EDUCATION:    Education details: Showed pt  anatomy images. Explained muscles attachments/ connection, physiology of deep core system/ spinal- thoracic-pelvis-lower kinetic chain as they relate to pt's presentation, Sx, and past Hx. Explained what and how these areas of deficits need to be restored to balance and function    See Therapeutic activity / neuromuscular re-education section  Answered pt's questions.   Person educated: Patient Education method: Explanation, Demonstration, Tactile cues, Verbal cues, and Handouts Education comprehension: verbalized understanding, returned demonstration, verbal cues required, tactile cues required, and needs further education     PLAN: PT FREQUENCY: 1x/week   PT DURATION: 10 weeks   PLANNED INTERVENTIONS:   Gait training;Stair training;Functional mobility training;DME Instruction;Therapeutic activities;Therapeutic exercise;Balance training;Neuromuscular re-education;Patient/family education;Vestibular;Visual/perceptual remediation/compensation;Passive range of motion;Moist Heat;Cryotherapy;Traction;Canalith Repostioning;Joint Manipulations;Manual lymph drainage;Manual techniques;Scar mobilization;Energy conservation;Dry needling;ADLs/Self Care Home Management;Biofeedback;Electrical Stimulation;Taping    PLAN FOR NEXT SESSION: See clinical impression for plan     GOALS: Goals reviewed with patient? Yes  SHORT TERM GOALS: Target date: 12/29/2023      Pt will demo IND with HEP                    Baseline: Not IND            Goal status: MET    LONG TERM GOALS: Target date: 06/29/2024    1.Pt will demo proper deep core coordination without chest breathing and optimal excursion of diaphragm/pelvic floor in order to promote spinal stability and pelvic floor function  Baseline: dyscoordination Goal status: ongoing   2.  Pt will demo proper body mechanics in against gravity tasks and ADLs  work tasks, fitness  to minimize straining pelvic floor / back    Baseline: not IND, improper  form that places strain on pelvic floor  Goal status: ongoing   3. Pt will demo reciprocal gait pattern, longer stride length  in order to ambulate safely in community and return to fitness routine , possibly return to tai chi Baseline:  armswings present, heel striking, minimal knee /hip flexion  Goal status: MET    4. Pt will demo levelled pelvic girdle and shoulder height in order to progress to deep core strengthening HEP and restore mobility at spine, pelvis, gait, posture minimize falls, and improve balance  Baseline: R shoulder/ R pelvis lowered  Goal status:   MET   5. Pt will improve PFDI-7 questionnaire to  pts  score change  to demo improved QOL  Baseline:    ( greater pts indicate greater negative impact on QOL)  57   pts  ( total)    --> 48   27   pts  ( UIQ-7 )   --->   29  27   pts  ( CRAIQ-7 )   --> 19  0   pts  ( POPIQ-7 )   Goal status: ongoing  6. Pt will report no longer seeking bathroom in public restrooms every hour and instead, able to go once every 2 hours in order to shop and travel  Baseline:Pt wears a Depends when traveling. Pt has to drink 64 fl oz water a day and aim for 80 fl oz due to NPH.   Pt seeks public restrooms once an hour.   When not traveling pt does not wear a pantyliner.  Pt arrives home in her garage and the moment she opens the door., she has leakage before making it to the bathroom. This occurs 60% of the time  Goal status ;  ongoing   7. Pt will report increased Type 4 stool consistency to >25% of the time and no more straining . Pt will report less fecal leakage by 50% of the time  Baseline: . Pt has stool type 6-7 / bowel movements occurs daily. Straining across 10% of the time .  Goal status:  MET  no straining with BM and Type 4 across 100 % of the time    8. Pt will report no leakage upon arriving in garage and able to to make it to the toilet before leakage across  > 50%  of the time  Baseline:Pt arrives home in her garage and the  moment she opens the door., she has leakage before making it to the bathroom. This leakage  occurs 60% of the time  Goal status: Ongoing   Pia Lupe Plump, PT 04/20/2024, 10:21 AM  "

## 2024-04-22 ENCOUNTER — Ambulatory Visit: Attending: Cardiology | Admitting: Cardiology

## 2024-04-22 ENCOUNTER — Ambulatory Visit: Payer: Medicare HMO | Admitting: Neurosurgery

## 2024-04-22 ENCOUNTER — Encounter: Payer: Self-pay | Admitting: Cardiology

## 2024-04-22 VITALS — BP 118/68 | HR 78 | Ht 65.0 in | Wt 119.0 lb

## 2024-04-22 DIAGNOSIS — R931 Abnormal findings on diagnostic imaging of heart and coronary circulation: Secondary | ICD-10-CM

## 2024-04-22 DIAGNOSIS — I2510 Atherosclerotic heart disease of native coronary artery without angina pectoris: Secondary | ICD-10-CM

## 2024-04-22 MED ORDER — EZETIMIBE 10 MG PO TABS: 10.0000 mg | ORAL_TABLET | Freq: Every day | ORAL | 3 refills | Status: AC

## 2024-04-22 NOTE — Progress Notes (Signed)
 " Cardiology Office Note:    Date:  04/22/2024   ID:  Martha Wilson, DOB 12-Jun-1950, MRN [id number]  PCP:  [name] Verneita CROME, MD  [facility] Cardiologist:  Redell Cave, MD  [facility] Electrophysiologist:  None   Referring MD: [name] Verneita CROME, MD   Chief Complaint  Patient presents with   Follow-up    12 month follow up  / Stop Atorvastatin  2 months ago due to leg cramps at night.  pt has been doing well with no complaints of chest pain, chest pressure or SOB, medication reviewed verbally with patient .     History of Present Illness:    Martha Wilson is a 74 y.o. 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  Doing okay, denies chest pain or shortness of breath.  Endorse having muscle aches with taking Lipitor.  She stopped Lipitor about 2 months ago with resolution of symptoms.  Overall feels well.  No acute concerns.    Prior notes Coronary CTA 04/2021 mild RCA and proximal LAD stenosis. Echo 01/2021, EF 45%, mild to moderate TR left heart cath August 2014 20% proximal LAD lesion, mild diffuse disease in RCA.   Apparently has a history of tricuspid valve prolapse with mild RV dilatation.   Outside echo report 01/2020 showed normal systolic function, EF 63%.  Moderately enlarged RV, RV normal systolic function, RVSP 32 mmHg. Previous cardiologist was in [place] .  She still gets yearly echocardiogram for tricuspid valve insufficiency with serial echocardiograms.  Past Medical History:  Diagnosis Date   Allergies    Arthritis    right knee   Headache    History of methicillin resistant staphylococcus aureus (MRSA)    Hyperthyroidism    MVP (mitral valve prolapse)    Normal pressure hydrocephalus (HCC)    Osteopenia    Palpitations    stress related   Skin cancer    BASAL CELL   Status post ventriculoperitoneal shunt  06/2020   by [facility] for NPH   Tricuspid valve prolapse    Wears dentures    partial upper and lower    Past Surgical History:  Procedure Laterality Date   APPLICATION OF CRANIAL NAVIGATION N/A 05/31/2020   Procedure: APPLICATION OF CRANIAL NAVIGATION;  Surgeon: Clois Reeves, MD;  Location: [facility] ORS;  Service: Neurosurgery;  Laterality: N/A;   BREAST BIOPSY  1980's   pt states she had a bx in the 80's, not sure of side, benign   breat biopsy     CATARACT EXTRACTION W/PHACO Right 09/22/2018   Procedure: CATARACT EXTRACTION PHACO AND INTRAOCULAR LENS PLACEMENT (IOC)  RIGHT panooptix;  Surgeon: Jaye Fallow, MD;  Location: [facility];  Service: Ophthalmology;  Laterality: Right;   CATARACT EXTRACTION W/PHACO Left 10/06/2018   Procedure: CATARACT EXTRACTION PHACO AND INTRAOCULAR LENS PLACEMENT (IOC) LEFT PANOPTIX LENS;  Surgeon: Jaye Fallow, MD;  Location: [facility];  Service: Ophthalmology;  Laterality: Left;   COLONOSCOPY     COLONOSCOPY WITH PROPOFOL  N/A 11/19/2018   Procedure: COLONOSCOPY WITH PROPOFOL ;  Surgeon: Therisa Bi, MD;  Location: [facility];  Service: Gastroenterology;  Laterality: N/A;   COLONOSCOPY WITH PROPOFOL  N/A 09/21/2021   Procedure: COLONOSCOPY WITH PROPOFOL ;  Surgeon: Therisa Bi, MD;  Location: [facility] ENDOSCOPY;  Service: Gastroenterology;  Laterality: N/A;   ORIF FOOT FRACTURE     VENTRICULOPERITONEAL SHUNT Right 05/31/2020   Procedure: SHUNT  INSERTION VENTRICULAR-PERITONEAL;  Surgeon: Clois Fret, MD;  Location: [facility];  Service: Neurosurgery;  Laterality: Right;  Dr Tye to assist    Current Medications: Current Meds  Medication Sig   acetaminophen  (TYLENOL ) 500 MG tablet Take 1,000 mg by mouth every 6 (six) hours as needed for moderate pain or headache.   aspirin  EC 81 MG tablet Take 1 tablet (81 mg total) by mouth daily. Swallow whole.   cholecalciferol (VITAMIN D3) 25 MCG (1000 UNIT) tablet Take  2,000 Units by mouth daily.   Cyanocobalamin  (B-12) 2500 MCG TABS Take 2,500 mcg by mouth daily.   cycloSPORINE (RESTASIS) 0.05 % ophthalmic emulsion 1 drop 2 (two) times daily.   ezetimibe  (ZETIA ) 10 MG tablet Take 1 tablet (10 mg total) by mouth daily.   fluticasone (FLONASE) 50 MCG/ACT nasal spray Place 2 sprays into both nostrils daily.   hydrocortisone 2.5 % cream Apply 1 Application topically as needed.   methimazole  (TAPAZOLE ) 5 MG tablet Take 0.5 tablets (2.5 mg total) by mouth at bedtime.   Multiple Vitamins-Minerals (PRESERVISION AREDS 2) CAPS Take 1 capsule by mouth 2 (two) times daily.   NON FORMULARY TIMELINE MITOPURE   valACYclovir  (VALTREX ) 500 MG tablet TAKE 1 TABLET (500 MG TOTAL) BY MOUTH DAILY.   varenicline  (CHANTIX  CONTINUING MONTH PAK) 1 MG tablet Take 1 tablet (1 mg total) by mouth 2 (two) times daily.   Current Facility-Administered Medications for the 04/22/24 encounter (Office Visit) with Darliss Rogue, MD  Medication   denosumab  (PROLIA ) injection 60 mg   denosumab  (PROLIA ) injection 60 mg   [START ON 06/02/2024] denosumab  (PROLIA ) injection 60 mg     Allergies:   Bee venom, Latex, Penicillins, and Adhesive [tape]   Social History   Socioeconomic History   Marital status: Single    Spouse name: Not on file   Number of children: Not on file   Years of education: Not on file   Highest education level: Doctorate  Occupational History   Not on file  Tobacco Use   Smoking status: Every Day    Current packs/day: 0.25    Average packs/day: 0.5 packs/day for 35.4 years (17.6 ttl pk-yrs)    Types: Cigarettes    Start date: 05/23/1987    Last attempt to quit: 05/22/2022   Smokeless tobacco: Never  Vaping Use   Vaping status: Never Used  Substance and Sexual Activity   Alcohol use: Yes    Alcohol/week: 3.0 standard drinks of alcohol    Types: 3 Glasses of wine per week    Comment: OCC   Drug use: Never   Sexual activity: Not Currently  Other Topics  Concern   Not on file  Social History Narrative   Not on file   Social Drivers of Health   Tobacco Use: High Risk (04/22/2024)   Patient History    Smoking Tobacco Use: Every Day    Smokeless Tobacco Use: Never    Passive Exposure: Not on file  Financial Resource Strain: Low Risk (01/19/2024)   Overall Financial Resource Strain (CARDIA)    Difficulty of Paying Living Expenses: Not hard at all  Food Insecurity: No Food Insecurity (01/19/2024)   Epic    Worried About Programme Researcher, Broadcasting/film/video in the Last Year: Never true    Ran Out of Food in the Last Year: Never true  Transportation Needs: No Transportation Needs (01/19/2024)   Epic    Lack of Transportation (Medical): No    Lack of Transportation (Non-Medical): No  Physical Activity: Insufficiently Active (01/19/2024)   Exercise Vital Sign    Days of Exercise per Week: 4 days    Minutes of Exercise per Session: 30 min  Stress: No Stress Concern Present (01/19/2024)   [employer] of Occupational Health - Occupational Stress Questionnaire    Feeling of Stress: Only a little  Social Connections: Moderately Integrated (01/19/2024)   Social Connection and Isolation Panel    Frequency of Communication with Friends and Family: More than three times a week    Frequency of Social Gatherings with Friends and Family: Once a week    Attends Religious Services: More than 4 times per year    Active Member of [employer] or Organizations: Yes    Attends Banker Meetings: More than 4 times per year    Marital Status: Widowed  Depression (PHQ2-9): Low Risk (01/21/2024)   Depression (PHQ2-9)    PHQ-2 Score: 0  Alcohol Screen: Low Risk (01/19/2024)   Alcohol Screen    Last Alcohol Screening Score (AUDIT): 3  Housing: Low Risk (01/19/2024)   Epic    Unable to Pay for Housing in the Last Year: No    Number of Times Moved in the Last Year: 0    Homeless in the Last Year: No  Utilities: Not At Risk (12/22/2023)   Received from [facility]   Epic    In the past 12 months has the electric, gas, oil, or water company threatened to shut off services in your home?: No  Health Literacy: Not on file     Family History: The patient's family history includes Early death in her brother; Heart attack in her father; Thyroid  disease in her brother.  ROS:   Please see the history of present illness.     All other systems reviewed and are negative.  EKGs/Labs/Other Studies Reviewed:    The following studies were reviewed today:  EKG Interpretation Date/Time:  Thursday April 22 2024 09:03:44 EST Ventricular Rate:  78 PR Interval:  144 QRS Duration:  78 QT Interval:  388 QTC Calculation: 442 R Axis:   -70  Text Interpretation: Normal sinus rhythm Left axis deviation Pulmonary disease pattern Confirmed by Darliss Rogue ([id number]) on 04/22/2024 9:25:55 AM    Recent Labs: 01/22/2024: ALT 16; BUN 18; Creatinine, Ser 1.03; Hemoglobin 15.4; Platelets 268.0; Potassium 4.8; Sodium 140  Recent Lipid Panel    Component Value Date/Time   CHOL 130 03/31/2023 1011   TRIG 108.0 03/31/2023 1011   HDL 60.30 03/31/2023 1011   CHOLHDL 2 03/31/2023 1011   VLDL 21.6 03/31/2023 1011   LDLCALC 48 03/31/2023 1011   LDLDIRECT 51.0 03/31/2023 1011     Risk Assessment/Calculations:      Physical Exam:    VS:  BP 118/68 (BP Location: Left Arm, Patient Position: Sitting, Cuff Size: Normal)   Pulse 78   Ht 5' 5 (1.651 m)   Wt 119 lb (54 kg)   SpO2 99%   BMI 19.80 kg/m     Wt Readings from Last 3 Encounters:  04/22/24 119 lb (54 kg)  02/09/24 119 lb (54 kg)  01/27/24 120 lb (54.4 kg)     GEN:  Well nourished, well developed in no acute distress HEENT: Normal NECK: No JVD; No carotid bruits CARDIAC: RRR, no murmurs, rubs, gallops RESPIRATORY:  Clear to auscultation without rales, wheezing or rhonchi  ABDOMEN: Soft, non-tender, non-distended MUSCULOSKELETAL:  No edema; No deformity  SKIN: Warm and  dry NEUROLOGIC:  Alert and oriented x 3 PSYCHIATRIC:  Normal affect   ASSESSMENT:    1. Coronary artery disease involving native coronary artery of native heart without angina pectoris   2. Decreased cardiac ejection fraction    PLAN:    In order of problems listed above:  Nonobstructive CAD, mild disease in proximal LAD and mid RCA.  Calcium  score 315.  Complaints of myalgias, agree with stopping Lipitor.  Start Zetia  10 mg daily.  Continue aspirin  81 mg daily.  Repeat lipid panel in 6 months. History of mildly reduced ejection fraction, EF 45%.  Repeat echo 02/2022 EF normalized 50 to 55%.  Patient is euvolemic, asymptomatic.  No GDMT due to low normal BPs, NPH with VP shunt, dizziness .  Follow-up yearly.   Medication Adjustments/Labs and Tests Ordered: Current medicines are reviewed at length with the patient today.  Concerns regarding medicines are outlined above.  Orders Placed This Encounter  Procedures   Lipid panel   EKG 12-Lead    Meds ordered this encounter  Medications   ezetimibe  (ZETIA ) 10 MG tablet    Sig: Take 1 tablet (10 mg total) by mouth daily.    Dispense:  90 tablet    Refill:  3       Signed, Redell Cave, MD  04/22/2024 10:09 AM    [facility] "

## 2024-04-22 NOTE — Patient Instructions (Signed)
 Medication Instructions:  - START zetia  10 mg daily   *If you need a refill on your cardiac medications before your next appointment, please call your pharmacy*  Lab Work: Your provider would like for you to return in 6 months or around 10/20/2024 to have the following labs drawn: fasting lipid panel.  Please go to [facility] [address] ([facility] [address], [place] [address] You do not need an appointment.  They are open from 8 am- 4:30 pm.  Lunch from 1:00 pm- 2:00 pm You DO need to be fasting.  If you have labs (blood work) drawn today and your tests are completely normal, you will receive your results only by: MyChart Message (if you have MyChart) OR A paper copy in the mail If you have any lab test that is abnormal or we need to change your treatment, we will call you to review the results.  Testing/Procedures: No test ordered today   Follow-Up: At [facility], you and your health needs are our priority.  As part of our continuing mission to provide you with exceptional heart care, our providers are all part of one team.  This team includes your primary Cardiologist (physician) and Advanced Practice Providers or APPs (Physician Assistants and Nurse Practitioners) who all work together to provide you with the care you need, when you need it.  Your next appointment:   1 year(s)  Provider:   You may see Redell Cave, MD or one of the following Advanced Practice Providers on your designated Care Team:   Lonni Meager, NP Lesley Maffucci, PA-C Bernardino Bring, PA-C Cadence Olney, PA-C Tylene Lunch, NP Barnie Hila, NP    We recommend signing up for the patient portal called MyChart.  Sign up information is provided on this After Visit Summary.  MyChart is used to connect with patients for Virtual Visits (Telemedicine).  Patients are able to view lab/test results, encounter notes, upcoming appointments, etc.  Non-urgent messages can be  sent to your provider as well.   To learn more about what you can do with MyChart, go to [email or web address].

## 2024-04-28 ENCOUNTER — Ambulatory Visit: Admitting: Physical Therapy

## 2024-04-28 DIAGNOSIS — M533 Sacrococcygeal disorders, not elsewhere classified: Secondary | ICD-10-CM | POA: Diagnosis not present

## 2024-04-28 DIAGNOSIS — M5459 Other low back pain: Secondary | ICD-10-CM

## 2024-04-28 DIAGNOSIS — R2689 Other abnormalities of gait and mobility: Secondary | ICD-10-CM

## 2024-04-28 NOTE — Therapy (Signed)
 " OUTPATIENT PHYSICAL THERAPY TREATMENT  / Recert/  Progress Note across 10 visits 12/01/23 to  04/20/24    Patient Name: Martha Wilson MRN: [id number] DOB:10/25/1950, 74 y.o., female Today's Date: 04/28/2024   PT End of Session - 04/28/24 1358     Visit Number 11    Number of Visits 20    Date for Recertification  06/29/24    PT Start Time 1025    PT Stop Time 1110    PT Time Calculation (min) 45 min    Activity Tolerance Patient tolerated treatment well;No increased pain    Behavior During Therapy WFL for tasks assessed/performed           Past Medical History:  Diagnosis Date   Allergies    Arthritis    right knee   Headache    History of methicillin resistant staphylococcus aureus (MRSA)    Hyperthyroidism    MVP (mitral valve prolapse)    Normal pressure hydrocephalus (HCC)    Osteopenia    Palpitations    stress related   Skin cancer    BASAL CELL   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   by [facility] for NPH   Tricuspid valve prolapse    Wears dentures    partial upper and lower   Past Surgical History:  Procedure Laterality Date   APPLICATION OF CRANIAL NAVIGATION N/A 05/31/2020   Procedure: APPLICATION OF CRANIAL NAVIGATION;  Surgeon: Clois Reeves, MD;  Location: [facility];  Service: Neurosurgery;  Laterality: N/A;   BREAST BIOPSY  1980's   pt states she had a bx in the 80's, not sure of side, benign   breat biopsy     CATARACT EXTRACTION W/PHACO Right 09/22/2018   Procedure: CATARACT EXTRACTION PHACO AND INTRAOCULAR LENS PLACEMENT (IOC)  RIGHT panooptix;  Surgeon: Jaye Fallow, MD;  Location: [facility];  Service: Ophthalmology;  Laterality: Right;   CATARACT EXTRACTION W/PHACO Left 10/06/2018   Procedure: CATARACT EXTRACTION PHACO AND INTRAOCULAR LENS PLACEMENT (IOC) LEFT PANOPTIX LENS;  Surgeon: Jaye Fallow, MD;  Location: [facility];  Service: Ophthalmology;  Laterality: Left;   COLONOSCOPY      COLONOSCOPY WITH PROPOFOL  N/A 11/19/2018   Procedure: COLONOSCOPY WITH PROPOFOL ;  Surgeon: Therisa Bi, MD;  Location: [facility];  Service: Gastroenterology;  Laterality: N/A;   COLONOSCOPY WITH PROPOFOL  N/A 09/21/2021   Procedure: COLONOSCOPY WITH PROPOFOL ;  Surgeon: Therisa Bi, MD;  Location: [facility];  Service: Gastroenterology;  Laterality: N/A;   ORIF FOOT FRACTURE     VENTRICULOPERITONEAL SHUNT Right 05/31/2020   Procedure: SHUNT INSERTION VENTRICULAR-PERITONEAL;  Surgeon: Clois Reeves, MD;  Location: [facility];  Service: Neurosurgery;  Laterality: Right;  Dr Tye to assist   Patient Active Problem List   Diagnosis Date Noted   Urinary and fecal incontinence 10/21/2023   Diarrhea 07/05/2023   Macrocytosis 04/22/2023   Post-menopause atrophic vaginitis 04/22/2023   Aortic atherosclerosis (HCC) 04/22/2023   Dysuria 03/24/2023   Positive self-administered antigen test for COVID-19 11/11/2022   Neck pain without injury 10/05/2022   Skin cancer of arm 10/05/2022   Urinary frequency 10/05/2022   CKD stage 3a, GFR 45-59 ml/min (HCC) 10/04/2022   Nausea 06/26/2022   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 03/19/2022   Osteoporosis 02/19/2022   Cardiomyopathy (HCC) 06/25/2021   Pulmonary nodule, right 06/25/2021   Tinnitus aurium, bilateral 12/26/2020   Status post ventriculoperitoneal shunt 06/21/2020   NPH (normal pressure hydrocephalus) (HCC) 05/31/2020   Orthostasis 11/30/2019  Abnormal EKG 11/30/2019   Prediabetes 11/30/2019   Dizziness 11/08/2019   Balance disorder 10/03/2019   Sciatica neuralgia 10/01/2019   Tubular adenoma of colon 11/21/2018   Multiple thyroid  nodules 10/30/2018   Hyperthyroidism 10/30/2018   Tricuspid valve insufficiency, non-rheumatic 10/30/2018   Coronary artery disease due to lipid rich plaque 10/30/2018   Tobacco abuse 10/30/2018   Urge and stress incontinence 05/04/2014    PCP: [name]   REFERRING  PROVIDER: [name]   REFERRING DIAG: Weakness of Anal Sphincter   Rationale for Evaluation and Treatment Rehabilitation  THERAPY DIAG:  Sacrococcygeal disorders, not elsewhere classified   Other abnormalities of gait and mobility   Other low back pain  ONSET DATE:   SUBJECTIVE:         SUBJECTIVE STATEMENT TODAY:  Pt reported she had shooting pain along inner R thigh yesterday and today. Pt did no do her strap stretch for hamstring this morning  SUBJECTIVE STATEMENT ON EVAL  12/01/23  1) urge incontinence : Pt wears a Depends when traveling. Pt has to drink 64 fl oz water a day and aim for 80 fl oz due to NPH.   Pt seeks public restrooms once an hour.   When not traveling pt does not wear a pantyliner.  Pt arrives home in her garage and the moment she opens the door., she has leakage before making it to the bathroom. This occurs 60% of the time   2)  fecal leakage: pt is not aware when it occurs . Pt has stool type 6-7 / bowel movements occurs daily. Straining across 10% of the time .   3) LBP radiating to posterior L midthigh:  Pt has to put ice on it to go to sleep once a month, 3 nights.. Sometimes it is 8/10 with traveling and carrying bag over her shoulder, lifting groceries. suitcase, objects from floor, lifting mattress to make her bed.   Pain eases with massage and stretches.   Pt missed a month of walking due to her back . Pt typically walks 1.2 miles.  Pt is not stretching after walking   Enjoys Chair yoga , walking 1.2 miles,   Pt used to do tai chi but she has concerns about falling due to NPH .  Pt has not been back since her ventriculoperitoneal shunt placement.    PERTINENT HISTORY:  Ventriculoperitoneal shunt (Right) due to  normal pressure hydrocephalus   PAIN:  Are you having pain? Yes: see above   PRECAUTIONS:  Normal pressure hydrocephalus:   Pt cannot go near magnets/ MRI  . Pt gets dizziness with leaning forward / gardening. Pt avoids gardening   WEIGHT  BEARING RESTRICTIONS:   No   FALLS:  Has patient fallen in last 6 months? No   LIVING ENVIRONMENT: Lives with: alone  Lives in: [address] community  Stairs: in [place], one story, in [place], two stories    OCCUPATION: retired runner, broadcasting/film/video   PLOF: IND   PATIENT GOALS:   Not having to go into the bathroom when she walks into the house. Be able to walk 30-60 min , be IND   OBJECTIVE:   [facility] PT Assessment - 04/28/24 1358       Coordination   Coordination and Movement Description pelvic pertubation with level 2 deep core      Other:   Other/ Comments showed the exericses she had done before ( knees to chest but with rounded shoudlers) , back extension wpropped on elbows with upper trap  overuse,      Palpation   SI assessment  levelled pelvis and shoulder, no more forward head posture   Hypomobile toe abduction great toe to digit III  / midfoot to R SIJ mobility          [facility] Adult PT Treatment/Exercise - 04/28/24 1400       Neuro Re-ed    Neuro Re-ed Details  provided propioceptive and tactile cues for proper techniques for deep core level 1-2,  stabilization with use of forearm and fist to minimize upper trap in back ext exercise,  hand placemnt in  knee to chest to minimzie rounded shoulders      Manual Therapy   Manual therapy comments STM/MWM at problem areas noted in assessment to promote LKC mobility with toe abduction great toe to digit III  / midfoot to R SIJ mobility             HOME EXERCISE PROGRAM: See pt instruction section    ASSESSMENT:  CLINICAL IMPRESSION:   Pt met 3/8 goals and progressing well towards remaining goals related to the following Sx:   1) urge incontinence 2)  fecal leakage: 3) LBP radiating to posterior L midthigh  Improvements include  Pt showed levelled pelvis and shoulders and more stacked posture which helps with IAP function for leakage and balance  LBP no longer radiates to thigh. Pain decreased from 8/10 to 5/10.    Pt is returning to walking without LBP issues and without a cane    Pt no longer gets up in the middle of the night pee as often, decreased from 3 x a night to 1x night.  Pt stool consistency improved to Type 4 daily instead  of Type 6-7.     Remaining issues to work on with skilled PT include fecal leakage, urge incontinence and balance.    Gait and balance continue to improve.   Focused today to minimize c/o R inner thigh shooting pain which was related hypomobile LKC from R SIJ to great toe/ digit II/ midfoot .  Provided propioceptive and tactile cues for proper techniques for deep core level 1-2,  stabilization with use of forearm and fist to minimize upper trap in back ext exercise,  hand placemnt in  knee to chest to minimzie rounded shoulders   Plan to put HEP into chart and modify her own self selected exercises  at upcoming session.   Regional interdependent approaches will yield greater benefits in pt's POC.  Pt w/ ventriculoperitoneal shunt (Right) due to  normal pressure hydrocephalus and plan to use semi reclined positions in lieu of supine if needed.  Provided education to avoid any exercise/ yoga poses where hips are higher than heart which can cause increased intracranial pressure to the gravity.    Plan to address pelvic floor issues once pelvis and spine are realigned to yield better outcomes.                                                       Pt benefits from skilled PT to address remaining goals.    OBJECTIVE IMPAIRMENTS decreased activity tolerance, decreased coordination, decreased endurance, decreased mobility, difficulty walking, decreased ROM, decreased strength, decreased safety awareness, hypomobility, increased muscle spasms, impaired flexibility, improper body mechanics, postural dysfunction, and pain.   ACTIVITY LIMITATIONS  self-care,  home chores, work tasks    PARTICIPATION LIMITATIONS:  community, walking activities    PERSONAL FACTORS   affecting  patient's functional outcome:    REHAB POTENTIAL: Good   CLINICAL DECISION MAKING: Evolving/moderate complexity   EVALUATION COMPLEXITY: Moderate    PATIENT EDUCATION:    Education details: Showed pt anatomy images. Explained muscles attachments/ connection, physiology of deep core system/ spinal- thoracic-pelvis-lower kinetic chain as they relate to pt's presentation, Sx, and past Hx. Explained what and how these areas of deficits need to be restored to balance and function    See Therapeutic activity / neuromuscular re-education section  Answered pt's questions.   Person educated: Patient Education method: Explanation, Demonstration, Tactile cues, Verbal cues, and Handouts Education comprehension: verbalized understanding, returned demonstration, verbal cues required, tactile cues required, and needs further education     PLAN: PT FREQUENCY: 1x/week   PT DURATION: 10 weeks   PLANNED INTERVENTIONS:   Gait training;Stair training;Functional mobility training;DME Instruction;Therapeutic activities;Therapeutic exercise;Balance training;Neuromuscular re-education;Patient/family education;Vestibular;Visual/perceptual remediation/compensation;Passive range of motion;Moist Heat;Cryotherapy;Traction;Canalith Repostioning;Joint Manipulations;Manual lymph drainage;Manual techniques;Scar mobilization;Energy conservation;Dry needling;ADLs/Self Care Home Management;Biofeedback;Electrical Stimulation;Taping    PLAN FOR NEXT SESSION: See clinical impression for plan     GOALS: Goals reviewed with patient? Yes  SHORT TERM GOALS: Target date: 12/29/2023      Pt will demo IND with HEP                    Baseline: Not IND            Goal status: MET    LONG TERM GOALS: Target date: 06/29/2024    1.Pt will demo proper deep core coordination without chest breathing and optimal excursion of diaphragm/pelvic floor in order to promote spinal stability and pelvic floor function  Baseline:  dyscoordination Goal status: ongoing   2.  Pt will demo proper body mechanics in against gravity tasks and ADLs  work tasks, fitness  to minimize straining pelvic floor / back    Baseline: not IND, improper form that places strain on pelvic floor  Goal status: ongoing   3. Pt will demo reciprocal gait pattern, longer stride length  in order to ambulate safely in community and return to fitness routine , possibly return to tai chi Baseline:  armswings present, heel striking, minimal knee /hip flexion  Goal status: MET    4. Pt will demo levelled pelvic girdle and shoulder height in order to progress to deep core strengthening HEP and restore mobility at spine, pelvis, gait, posture minimize falls, and improve balance  Baseline: R shoulder/ R pelvis lowered  Goal status:   MET   5. Pt will improve PFDI-7 questionnaire to  pts  score change  to demo improved QOL  Baseline:    ( greater pts indicate greater negative impact on QOL)  57   pts  ( total)    --> 48   27   pts  ( UIQ-7 )   --->   29  27   pts  ( CRAIQ-7 )   --> 19  0   pts  ( POPIQ-7 )   Goal status: ongoing  6. Pt will report no longer seeking bathroom in public restrooms every hour and instead, able to go once every 2 hours in order to shop and travel  Baseline:Pt wears a Depends when traveling. Pt has to drink 64 fl oz water a day and aim for 80 fl oz due to NPH.   Pt seeks public restrooms once  an hour.   When not traveling pt does not wear a pantyliner.  Pt arrives home in her garage and the moment she opens the door., she has leakage before making it to the bathroom. This occurs 60% of the time  Goal status ; ongoing   7. Pt will report increased Type 4 stool consistency to >25% of the time and no more straining . Pt will report less fecal leakage by 50% of the time  Baseline: . Pt has stool type 6-7 / bowel movements occurs daily. Straining across 10% of the time .  Goal status:  MET  no straining with BM and Type 4  across 100 % of the time    8. Pt will report no leakage upon arriving in garage and able to to make it to the toilet before leakage across  > 50%  of the time  Baseline:Pt arrives home in her garage and the moment she opens the door., she has leakage before making it to the bathroom. This leakage  occurs 60% of the time  Goal status: Ongoing   Pia Lupe Plump, PT 04/28/2024, 2:11 PM  "

## 2024-04-28 NOTE — Patient Instructions (Signed)
 Knee to chest with relaxed shoulders, hand under thighs  On belly, interlace fists and use forearms and fists for back exercise  Continue with deep core level 1-2

## 2024-05-04 ENCOUNTER — Ambulatory Visit: Admitting: Physical Therapy

## 2024-05-10 ENCOUNTER — Telehealth: Payer: Self-pay

## 2024-05-10 ENCOUNTER — Other Ambulatory Visit (HOSPITAL_COMMUNITY): Payer: Self-pay

## 2024-05-10 NOTE — Telephone Encounter (Signed)
 Prolia  VOB initiated via [email or web address]  Next Prolia  inj DUE: 06/02/24

## 2024-05-11 ENCOUNTER — Ambulatory Visit: Admitting: Physical Therapy

## 2024-05-11 ENCOUNTER — Other Ambulatory Visit (HOSPITAL_COMMUNITY): Payer: Self-pay

## 2024-05-11 NOTE — Telephone Encounter (Signed)
 Martha Wilson

## 2024-05-11 NOTE — Telephone Encounter (Signed)
 MEDICAL PA SUBMITTED VIA [employer]. Authorization Number : [id number]     APPROVED

## 2024-05-12 ENCOUNTER — Other Ambulatory Visit (HOSPITAL_COMMUNITY): Payer: Self-pay

## 2024-05-12 NOTE — Telephone Encounter (Signed)
 Pt ready for scheduling for PROLIA  on or after : 06/02/24  Option# 1: Buy/Bill (Office supplied medication)  Out-of-pocket cost due at time of clinic visit: $25  Number of injection/visits approved: 2  Primary: [employer] Prolia  co-insurance: 0% Admin fee co-insurance: $25  Secondary: --- Prolia  co-insurance:  Admin fee co-insurance:   Medical Benefit Details: Date Benefits were checked: 05/10/24 Deductible: NO/ Coinsurance: 0%/ Admin Fee: $25  Prior Auth: APPROVED PA# [id number] Expiration Date: 05/11/24-05/11/25   # of doses approved: 2 ----------------------------------------------------------------------- Option# 2- Med Obtained from pharmacy:  Pharmacy benefit: Copay $--- (Paid to pharmacy) Admin Fee: --- (Pay at clinic)  Prior Auth: --- PA# Expiration Date:   # of doses approved:   If patient wants fill through the pharmacy benefit please send prescription to: ---, and include estimated need by date in rx notes. Pharmacy will ship medication directly to the office.  Patient NOT eligible for Prolia  Copay Card. Copay Card can make patient's cost as little as $25. Link to apply: [email or web address]  ** This summary of benefits is an estimation of the patient's out-of-pocket cost. Exact cost may very based on individual plan coverage.

## 2024-05-18 ENCOUNTER — Ambulatory Visit: Attending: Internal Medicine | Admitting: Physical Therapy

## 2024-05-25 ENCOUNTER — Ambulatory Visit: Admitting: Physical Therapy

## 2024-05-26 ENCOUNTER — Ambulatory Visit: Admitting: Physical Therapy

## 2024-05-26 ENCOUNTER — Ambulatory Visit

## 2024-06-01 ENCOUNTER — Ambulatory Visit: Admitting: Physical Therapy

## 2024-06-02 ENCOUNTER — Ambulatory Visit

## 2024-06-08 ENCOUNTER — Ambulatory Visit: Admitting: Physical Therapy

## 2024-06-16 ENCOUNTER — Ambulatory Visit: Admitting: Physical Therapy

## 2024-06-21 ENCOUNTER — Ambulatory Visit

## 2024-06-22 ENCOUNTER — Ambulatory Visit: Attending: Internal Medicine | Admitting: Physical Therapy

## 2024-06-29 ENCOUNTER — Ambulatory Visit: Admitting: Physical Therapy

## 2024-07-06 ENCOUNTER — Ambulatory Visit: Admitting: Physical Therapy

## 2024-07-21 ENCOUNTER — Ambulatory Visit: Admitting: Internal Medicine

## 2024-09-02 ENCOUNTER — Ambulatory Visit: Admitting: Neurosurgery

## 2024-09-07 ENCOUNTER — Ambulatory Visit: Admitting: Neurosurgery
